# Patient Record
Sex: Female | Born: 1940 | Race: White | Hispanic: No | Marital: Married | Smoking: Never smoker
Health system: Southern US, Community
[De-identification: ages and names within clinical notes are randomized; demographics above are authoritative.]

## PROBLEM LIST (undated history)

## (undated) DIAGNOSIS — F419 Anxiety disorder, unspecified: Secondary | ICD-10-CM

## (undated) DIAGNOSIS — I10 Essential (primary) hypertension: Secondary | ICD-10-CM

## (undated) DIAGNOSIS — E079 Disorder of thyroid, unspecified: Secondary | ICD-10-CM

## (undated) DIAGNOSIS — E7800 Pure hypercholesterolemia, unspecified: Secondary | ICD-10-CM

## (undated) DIAGNOSIS — F329 Major depressive disorder, single episode, unspecified: Secondary | ICD-10-CM

## (undated) DIAGNOSIS — R52 Pain, unspecified: Secondary | ICD-10-CM

## (undated) DIAGNOSIS — F32A Depression, unspecified: Secondary | ICD-10-CM

## (undated) DIAGNOSIS — N390 Urinary tract infection, site not specified: Secondary | ICD-10-CM

## (undated) DIAGNOSIS — E039 Hypothyroidism, unspecified: Secondary | ICD-10-CM

## (undated) HISTORY — PX: TOTAL HIP ARTHROPLASTY: SHX124

## (undated) HISTORY — PX: OTHER SURGICAL HISTORY: SHX169

## (undated) HISTORY — PX: TONSILLECTOMY: SUR1361

## (undated) HISTORY — PX: HIP SURGERY: SHX245

---

## 1968-06-17 HISTORY — PX: BREAST BIOPSY: SHX20

## 1978-06-17 HISTORY — PX: AUGMENTATION MAMMAPLASTY: SUR837

## 1999-08-07 ENCOUNTER — Other Ambulatory Visit: Admission: RE | Admit: 1999-08-07 | Discharge: 1999-08-07 | Payer: Self-pay | Admitting: *Deleted

## 1999-08-24 ENCOUNTER — Encounter (INDEPENDENT_AMBULATORY_CARE_PROVIDER_SITE_OTHER): Payer: Self-pay | Admitting: *Deleted

## 1999-08-24 ENCOUNTER — Ambulatory Visit (HOSPITAL_COMMUNITY): Admission: RE | Admit: 1999-08-24 | Discharge: 1999-08-25 | Payer: Self-pay | Admitting: *Deleted

## 1999-08-24 ENCOUNTER — Encounter: Payer: Self-pay | Admitting: *Deleted

## 1999-12-03 ENCOUNTER — Ambulatory Visit (HOSPITAL_COMMUNITY): Admission: RE | Admit: 1999-12-03 | Discharge: 1999-12-03 | Payer: Self-pay | Admitting: *Deleted

## 2001-06-30 ENCOUNTER — Other Ambulatory Visit: Admission: RE | Admit: 2001-06-30 | Discharge: 2001-06-30 | Payer: Self-pay | Admitting: *Deleted

## 2003-02-15 ENCOUNTER — Ambulatory Visit (HOSPITAL_COMMUNITY): Admission: RE | Admit: 2003-02-15 | Discharge: 2003-02-15 | Payer: Self-pay | Admitting: Internal Medicine

## 2005-07-22 ENCOUNTER — Inpatient Hospital Stay (HOSPITAL_COMMUNITY): Admission: RE | Admit: 2005-07-22 | Discharge: 2005-07-26 | Payer: Self-pay | Admitting: Orthopedic Surgery

## 2005-08-28 ENCOUNTER — Encounter: Payer: Self-pay | Admitting: Orthopedic Surgery

## 2005-09-15 ENCOUNTER — Encounter: Payer: Self-pay | Admitting: Orthopedic Surgery

## 2005-09-18 ENCOUNTER — Other Ambulatory Visit: Admission: RE | Admit: 2005-09-18 | Discharge: 2005-09-18 | Payer: Self-pay | Admitting: Obstetrics and Gynecology

## 2005-10-15 ENCOUNTER — Encounter: Payer: Self-pay | Admitting: Orthopedic Surgery

## 2009-08-09 ENCOUNTER — Inpatient Hospital Stay (HOSPITAL_COMMUNITY): Admission: RE | Admit: 2009-08-09 | Discharge: 2009-08-12 | Payer: Self-pay | Admitting: Orthopedic Surgery

## 2009-09-12 ENCOUNTER — Encounter: Payer: Self-pay | Admitting: Orthopedic Surgery

## 2009-09-15 ENCOUNTER — Encounter: Payer: Self-pay | Admitting: Orthopedic Surgery

## 2009-10-15 ENCOUNTER — Encounter: Payer: Self-pay | Admitting: Orthopedic Surgery

## 2010-09-05 LAB — PROTIME-INR
INR: 1.02 (ref 0.00–1.49)
INR: 1.13 (ref 0.00–1.49)
INR: 2.09 — ABNORMAL HIGH (ref 0.00–1.49)
Prothrombin Time: 13.3 seconds (ref 11.6–15.2)
Prothrombin Time: 14.4 seconds (ref 11.6–15.2)
Prothrombin Time: 23.3 seconds — ABNORMAL HIGH (ref 11.6–15.2)

## 2010-09-05 LAB — COMPREHENSIVE METABOLIC PANEL
ALT: 18 U/L (ref 0–35)
AST: 25 U/L (ref 0–37)
Chloride: 102 mEq/L (ref 96–112)
GFR calc Af Amer: >60 mL/min (ref >60)
GFR calc non Af Amer: >60 mL/min (ref >60)
Sodium: 139 mEq/L (ref 135–145)
Total Bilirubin: 0.7 mg/dL (ref 0.3–1.2)

## 2010-09-05 LAB — URINALYSIS, ROUTINE W REFLEX MICROSCOPIC
Bilirubin Urine: NEGATIVE
Glucose, UA: NEGATIVE mg/dL
Ketones, ur: NEGATIVE mg/dL
Protein, ur: NEGATIVE mg/dL
Specific Gravity, Urine: 1.014 (ref 1.005–1.030)
Urobilinogen, UA: 0.2 mg/dL (ref 0.0–1.0)

## 2010-09-05 LAB — CBC
HCT: 27.8 % — ABNORMAL LOW (ref 36.0–46.0)
HCT: 41.3 % (ref 36.0–46.0)
Hemoglobin: 14.5 g/dL (ref 12.0–15.0)
Hemoglobin: 9.4 g/dL — ABNORMAL LOW (ref 12.0–15.0)
Hemoglobin: 9.6 g/dL — ABNORMAL LOW (ref 12.0–15.0)
Hemoglobin: 9.9 g/dL — ABNORMAL LOW (ref 12.0–15.0)
MCHC: 34.8 g/dL (ref 30.0–36.0)
MCHC: 35.2 g/dL (ref 30.0–36.0)
MCV: 95.4 fL (ref 78.0–100.0)
Platelets: 224 10*3/uL (ref 150–400)
Platelets: 231 10*3/uL (ref 150–400)
Platelets: 309 10*3/uL (ref 150–400)
RBC: 4.36 MIL/uL (ref 3.87–5.11)
RDW: 12.5 % (ref 11.5–15.5)
RDW: 12.6 % (ref 11.5–15.5)
WBC: 5.5 10*3/uL (ref 4.0–10.5)

## 2010-09-05 LAB — BASIC METABOLIC PANEL
BUN: 6 mg/dL (ref 6–23)
CO2: 28 mEq/L (ref 19–32)
CO2: 29 mEq/L (ref 19–32)
Calcium: 8.1 mg/dL — ABNORMAL LOW (ref 8.4–10.5)
Chloride: 103 mEq/L (ref 96–112)
GFR calc Af Amer: >60 mL/min (ref >60)
Glucose, Bld: 142 mg/dL — ABNORMAL HIGH (ref 70–99)
Potassium: 3.7 mEq/L (ref 3.5–5.1)
Sodium: 136 mEq/L (ref 135–145)
Sodium: 137 mEq/L (ref 135–145)

## 2010-11-02 NOTE — Discharge Summary (Signed)
[place]. [facility]  Patient:    Elizabeth Shepard, Elizabeth Shepard                           MRN: [id number] Adm. Date:  [id number] Disc. Date: [id number] Attending:  Carmelia Roller                           Discharge Summary  ADMITTING DIAGNOSIS:  Cholesteatoma, AD.  FINAL DIAGNOSIS:  Cholesteatoma, AD.  PROCEDURE PERFORMED:  Tympanomastoidectomy with ossiculoplasty, August 24, 1999.  HISTORY OF PRESENT ILLNESS:  Elizabeth Shepard is a 70 year old patient referred to our office by Dr. Charolotte Eke.  Elizabeth Shepard had a history of otalgia and decreased hearing in the right ear.  Initial examination showed apparent cholesteatoma above the short process of the malleus in the right ear.  Elizabeth Shepard was treated conservatively with drops.  She had a persistent middle ear effusion.  A polyethylene tube was inserted to improve visualization in the middle ear pathology.  A CT scan was obtained showing opacification of the middle ear, surrounding the ossicles, and opacification of the mastoid.  There was a suggestion of thinning or absence of the tegmen mastoideum.  Elizabeth Shepard findings were felt to be consistent with cholesteatoma and chronic mastoiditis.  She was a candidate or tympanomastoidectomy.  Indications and complications were discussed in detail.   ADMITTING PHYSICAL EXAMINATION:  Unremarkable with the exception of the otic findings.  HOSPITAL COURSE:  Elizabeth Shepard was admitted to [facility] on August 24, 1999. She was taken to the operating room the same day and underwent tympanomastoidectomy.  Intraoperative findings included cholesteatoma with a large amount of granulation tissue filling the posterior superior middle ear.  The epitympanum, antrum, and the superior portion of the mastoid disease appeared to be completely removed.  Removal required removal of an eroded incus and head of the malleus.  Reconstruction was obtained by incus rotation.  Elizabeth Shepard  tolerated the procedure well.  She was doing well her first postoperative day and is discharged.  DISCHARGE MEDICATIONS:  Keflex, Cortisporin, and Tylenol with Codeine.  FOLLOW-UP:  Elizabeth Shepard will be reexamined in our office in one week. DD:  08/25/99 TD:  08/26/99 Job: [id number]

## 2010-11-02 NOTE — Op Note (Signed)
. [facility]  Patient:    Elizabeth, Shepard                           MRN: [id number] Proc. Date: 08/24/99 Adm. Date:  [id number] Disc. Date: [id number] Attending:  Carmelia Roller CC:         Alfonse Flavors, M.D.             Louie L. Patseavouras, M.D.                           Operative Report  INDICATIONS AND JUSTIFICATION FOR PROCEDURE:  Elizabeth Shepard is a 70 year old patient, who was referred to our office by Dr. Magnus Ivan L. Patseavouras.  Elizabeth Shepard had a history of a plug sensation in her right ear.  She believed that this had its onset during a respiratory tract infection of October.  She had developed otalgia greater on the right than the left.  She felt as if she was talking in a barrel. Initial physical examination showed the left tympanic membrane was intact.  There were areas of tympanosclerosis and atrophy.  Right auditory canal contained dried discharge near the pars flaccida.  This was removed.  There was moist discharge  over the pars flaccida.  There was epitympanic membrane retraction with cholesteatomatous material present.  Elizabeth Shepard was initially started on Cortisporin.  She returned for debridement of the area.  Further examination showed granulation tissue over the pars flaccida.  A CT scan was obtained.  This showed extensive soft tissue opacification of the right middle ear and epitympanum with incasement and erosion of the ossicular chain, as well as, disruption of the tegmen tympani.  The findings were felt to be compatible with cholesteatoma.  Elizabeth Shepard had a middle ear effusion - AD.  She had a polyethylene tube inserted in the office for improved visualization.  Tympanomastoidectomy, its indications and complications were discussed in detail with Mrs. Buch and her husband.  She was  felt to be a candidate for tympanomastoidectomy because of probable cholesteatoma.  PREOPERATIVE DIAGNOSES: 1. Cholesteatoma, right ear  . 2. Chronic mastoiditis, right ear.  POSTOPERATIVE DIAGNOSES: 1. Cholesteatoma, right. 2. Chronic mastoiditis, right ear.  PROCEDURE PERFORMED:  Tympanomastoidectomy with incus rotation - A.D.  ANESTHESIA:  General endotracheal.  SURGEON:  Alfonse Flavors, M.D.  DESCRIPTION OF PROCEDURE:  Elizabeth Shepard was brought to the operating room, placed supine on the operating table.  She was induced with general anesthesia and intubated with an orotracheal tube.  The anesthetist noticed a small chip missing from a central incisor.  No chip could be found within the oral cavity. Xylocaine 1% with 100,000 epinephrine was injected in four quadrants in the right external auditory canal and the same solution was injected in the postauricular sulcus and over the temporalis fascia.  Needle electrodes for the Nim facial nerve monitor  were inserted in the orbicularis oris and the orbicularis oculi.  The right ear was prepped and draped in a sterile fashion.  Examination of the tympanic membrane ith the operating microscope showed a small mass of granulation tissue over the pars flaccida.  A posterior tympanotomy flap was incised from 6 oclock inferiorly to 3 oclock anteriorly.  A posterior tympanotomy flap was elevated to the fibrous annulus.  Fibrous annulus was elevated out of its sulcus.  The inferior middle ar was clear.  There was a mass of  granulation tissue covering the incus, stapes and posterior/superior quadrant of middle ear.  There was a polyethylene tube in place in the anterior/inferior quadrant.  The chordae tympani could not initially be visualized.  Using a Tabb knife, microcup forceps and suction, the granulation tissue was gradually dissected until the chorda tympani could be identified. The long process of the incus was identified and followed to the capitellum of the stapes.  Removal of the granulation tissue was extremely difficult as it was fibrous and adherent to all  surrounding structures.  Granulation tissue was gradually removed until the upper portion of the stapes superstructure could be  visualized.  The incus appeared to be hypermobile.  Cholesteatomatous debris was encountered along the long process of the incus and toward the head of the incus. Cotton balls with adrenalin were placed over the area of dissection.  A postauricular incision was made through the mastoid skin.  This was carried to he level of the temporalis fascia superiorly and to the mastoid cortex inferiorly. A T-shaped incision was made along the linea temporalis.  Temporalis adventitia and fascia were obtained for future use as a tympanic membrane graft.  The mastoid as exposed with a Office manager.  The area perforata and spine of Henley were exposed.  A Koerners flap was elevated from the posterior canal wall.  The oracle in Koerners flap were secured forward with a Belluci retractor, ______ retractor used to elevate temporalis muscle.  A large cutting bur was used to enter the mastoid cortex.  The mastoid cortex was progressively saucerized, identifying the area of the sigmoid sinus, tegmen mastoideum and posterior canal wall. Dissection was carried toward the antrum.  Inspite of the CT findings of marked thinning of the tegmen mastoideum, no dura was noted.  The area of the antrum was encountered. A large cholesteatoma was present.  Bone was carefully removed with Shepard bur to enlarge the antrum to allow access to the cholesteatoma.  The cholesteatoma matrix was debulked for better visualization.  Cholesteatoma was surrounded by dense fibrous granulation tissue.  This material was gradually dissected free from the antrum and posterior epitympanum.  The head of the incus was eroded. Incudostapedial joint was divided, the incus was rotated out of place.  The head of the malleus was amputated with a malleus nipper.  The chorda tympani remained intact.   Cholesteatoma and granulation tissue were removed from the epitympanum. Last remaining granulation tissue was along the facial ridge.  Granulation tissue  was carefully removed.  There was an area of dehiscent nerve in the horizontal portion of the facial nerve.  Nerve did not appear to be injured.  All disease as removed. Stimulation of the facial nerve proximal to the area of dehiscence showed normal response at 0.5 mA.  The mastoid and middle ear were irrigated.  The previously removed incus remnant was obtained and reshaped with a Hotel manager. Small pieces of Gelfoam were placed in the middle ear to surround the stapes superstructure.  The incus was placed between the long process of the malleus and the capitulum of the stapes.  The graft was supported with small pieces of Gelfoam.  Temporalis fascia was used to underlay the area of the pars flaccida, which had been the origin of the cholesteatoma.  The posterior middle ear and epitympanum  were filled with Gelfoam.  The tympanic membrane was turned posteriorly. Posterior tympanotomy flap was placed along the posterior canal wall.  Lateral surface of the tympanic membrane was covered with  Gelfoam.  The postauricular incision was closed with interrupted 3-0 chromic and 4-0 nylon.  Koerners flap was returned to the posterior canal wall.  Auditory canal was filled with Gelfoam.  Glasscock dressing was applied. SUMMARY OF PATHOLOGY:  Mrs. Hazelett had large cholesteatoma and abundant granulation tissue filling the posterior middle ear, epitympanum, antrum and superior portion of the mastoid.  Erosion of the head of the incus had taken place.  Head of the  malleus was amputated and incus rotation was performed.  The facial nerve was exposed in its horizontal portion by bone erosion.  FOLLOW-UP CARE:  Mrs. Soria will be admitted for overnight observation, IV hydration and IV analgesia.  Her discharge in the morning is  anticipated.  Discharge medications will include Keflex, Cortisporin and Tylenol with codeine.  She will be reexamined in our office in one week. DD:  08/24/99 TD:  08/26/99 Job: [id number]

## 2010-11-02 NOTE — H&P (Signed)
Elizabeth Shepard, Elizabeth Shepard                  ACCOUNT NO.:  [id number]   MEDICAL RECORD NO.:  [id number]          PATIENT TYPE:  INP   LOCATION:  NA                           FACILITY:  [facility]   PHYSICIAN:  Ollen Gross, M.D.    DATE OF BIRTH:  [birth year]   DATE OF ADMISSION:  DATE OF DISCHARGE:                                HISTORY & PHYSICAL   DATE OF OFFICE VISIT HISTORY AND PHYSICAL:  July 11, 2005.   CHIEF COMPLAINT:  Right hip pain.   HISTORY AND PHYSICAL:  Ms. Wulff is a 70 year old female who has been seen by  Dr. Lequita Halt for severe right hip pain.  She was referred over by Kerrin Champagne, M.D. having been found to have end-stage arthritis, that has  progressively gotten worse throughout the end of 2006.  It is felt that it  is interfering with her mobility.  She has gotten some temporary relief with  an intra-articular injection.  Her x-rays show that she does have some bone-  on-bone compartment loss with erosion of the superior aspect of the  acetabulum.  It is felt that due to the significant pain and arthritis in  her right hip, she would benefit from a hip replacement.  Risks and benefits  were discussed.  The patient is subsequently admitted to the hospital.   ALLERGIES:  CIPRO.   CURRENT MEDICATIONS:  1.  Darvocet.  2.  Ultram.  3.  Zoloft.  4.  Advil.  5.  Premarin.   PAST MEDICAL HISTORY:  History of UTI, osteoarthritis, postmenopausal.   PAST SURGICAL HISTORY:  Noncontributory.   FAMILY HISTORY:  Significant for diabetes and cancer.   SOCIAL HISTORY:  Married.  Part-time with [employer].  Nonsmoker,  no alcohol, one child.  She does have assistance lined up to help her with  care after surgery.   REVIEW OF SYSTEMS:  GENERAL:  No fevers, chills, or night sweats.  NEUROLOGY:  No seizures, syncope, or paralysis.  RESPIRATORY:  No shortness  of breath, productive cough, or hemoptysis.  CARDIOVASCULAR:  No angina,  chest pain, or orthopnea.   GASTROINTESTINAL:  No nausea, vomiting, diarrhea,  or constipation.  GENITOURINARY:  History of urinary tract infection.  No  hematuria or nocturia at this time.  MUSCULOSKELETAL:  Right hip.   PHYSICAL EXAMINATION:  VITAL SIGNS:  Pulse 68, respirations 12, blood  pressure 142/80.  GENERAL:  A 70 year old female, well-nourished, well-developed, in no acute  distress.  She is alert, oriented, cooperative, and pleasant.  She is  accompanied by her husband.  HEENT:  Normocephalic and atraumatic.  Pupils equal, round, and reactive to  light.  Oropharynx clear.  EOM's intact.  NECK:  Supple.  CHEST:  Clear.  HEART:  Regular rate and rhythm.  No murmur.  S1 and S2 noted.  ABDOMEN:  Soft, nontender.  Bowel sounds present.  RECTAL:  BREASTS:  GENITOURINARY:  Not done, not pertinent to present  illness.  EXTREMITIES:  Right hip shows flexion of only 95 degrees, no internal  rotation, about 10-15 degrees of  abduction and 10 degrees of adduction.  She  does ambulate with an antalgic gait.   IMPRESSION:  1.  Osteoarthritis right hip.  2.  History of urinary tract infections.  3.  Postmenopausal.   PLAN:  The patient was admitted to [facility] to undergo  right total hip arthroplasty.  Surgery will be performed by Ollen Gross,  M.D.      Alexzandrew L. Julien Girt, P.A.      Ollen Gross, M.D.  Electronically Signed    ALP/MEDQ  D:  07/21/2005  T:  07/22/2005  Job:  [id number]

## 2010-11-02 NOTE — Discharge Summary (Signed)
Elizabeth Shepard, Elizabeth Shepard                  ACCOUNT NO.:  [id number]   MEDICAL RECORD NO.:  [id number]          PATIENT TYPE:  INP   LOCATION:  1514                         FACILITY:  [facility]   PHYSICIAN:  Ollen Gross, M.D.    DATE OF BIRTH:  08-30-1940   DATE OF ADMISSION:  07/22/2005  DATE OF DISCHARGE:  07/26/2005                                 DISCHARGE SUMMARY   ADMISSION DIAGNOSES:  1.  Osteoarthritis, right hip.  2.  History of urinary tract infection.  3.  Postmenopausal.   DISCHARGE DIAGNOSES:  1.  Osteoarthritis, right hip status post right total hip arthroplasty.  2.  Mild postoperative blood loss anemia that did not require transfusion.  3.  History of urinary tract infection.  4.  Postmenopausal.   PROCEDURE:  On July 22, 2005 right total hip, surgeon Dr. Lequita Halt,  assistant Avel Peace, PA-C, anesthesia general, blood loss 500 mL.   CONSULTATIONS:  None.   BRIEF HISTORY:  Elizabeth Shepard is a 70 year old female with end-stage arthritis of  the right hip with intractable pain who now presents for total hip  arthroplasty.   LABORATORY DATA:  Preop CBC hemoglobin 13.2, hematocrit 38.2, normal white  count. Postop hemoglobin down to 10, drifted down to 9.7, lasted noted H&H  was stabilized at 9.7 and 27.5. PT and PTT preop 12.9 and 31 respectively,  INR 1.0. Serial pro times followed. Last noted PT and INR 19.4 and 1.6. Chem  panel on admission all within normal limits. Serial BMETs are followed and  sodium dropped from 136 to 134, glucose went up from 110 to 131 back down to  127. Remaining electrolytes remained within normal limits. Preop UA  negative. Blood group type O+.   EKG dated July 11, 2005, normal sinus rhythm, normal EKG, no significant  change since last tracing of August 23, 1999 confirmed by Dr. Olga Millers.  Right hip films on July 11, 2005, advanced osteoarthritis of right hip.  Portable pelvis July 22, 2005 and portable right hip July 22, 2005  both show right total hip without complications.   HOSPITAL COURSE:  Admitted to [facility], tolerated procedure  well, later transferred to the recovery room and then the orthopedic floor  and started on PCA and p.o. analgesics for pain control following surgery.  Doing quite well on day 1 with the exception of some mild asymptomatic  hypotension, given fluid boluses and I&Os were watched very carefully. The  Foley was left in, responded well to the fluids and the pressure was back up  by the following day. Started getting up with physical therapy on day 1 to a  chair and then by day 2 started getting up ambulating actually 80 feet and  then 120 feet later that day. Dressing was changed on day 2, incision looked  good. By day 3 again the asymptomatic hypotension postop had resolved,  tolerating fluids well by mouth, discontinued PCA and IVs. Already had a  small bowel movement on day 3, doing well with therapy and was ready to go  home by the  following day on day 4.   PLAN:  1.  The patient discharged home on July 26, 2005.  2.  Discharge diagnoses please see above.  3.  Discharge meds:  Coumadin, Percocet, Robaxin.  4.  Diet as tolerated.  5.  Followup in 2 weeks.   ACTIVITY:  Partial weight bearing 25-50%, home health PT and home health  nursing, total hip protocol.   DISPOSITION:  Home.   CONDITION ON DISCHARGE:  Improved.      Alexzandrew L. Julien Girt, P.A.      Ollen Gross, M.D.  Electronically Signed    ALP/MEDQ  D:  09/02/2005  T:  09/03/2005  Job:  [id number]

## 2010-11-02 NOTE — Op Note (Signed)
NAMEGERALENE, AFSHAR                  ACCOUNT NO.:  [id number]   MEDICAL RECORD NO.:  [id number]          PATIENT TYPE:  INP   LOCATION:  0008                         FACILITY:  [facility]   PHYSICIAN:  Ollen Gross, M.D.    DATE OF BIRTH:  07-18-1940   DATE OF PROCEDURE:  07/22/2005  DATE OF DISCHARGE:                                 OPERATIVE REPORT   PREOPERATIVE DIAGNOSIS:  Osteoarthritis right hip.   POSTOPERATIVE DIAGNOSIS:  Osteoarthritis right hip.   PROCEDURE:  Right total hip arthroplasty.   SURGEON:  Ollen Gross, M.D.   ASSISTANT:  Alexzandrew L. Julien Girt, P.A.   ANESTHESIA:  General.   ESTIMATED BLOOD LOSS:  500.   DRAINS:  Hemovac x1.   COMPLICATIONS:  None.   CONDITION:  Stable to recovery.   BRIEF CLINICAL NOTE:  Ms. Pinheiro is a 70 year old female with end-stage  osteoarthritis of the right hip. She has had intractable pain. She presents  now for right total hip arthroplasty.   PROCEDURE IN DETAIL:  After successful administration of general anesthetic,  the patient's placed in the left lateral decubitus position with the right  side up and held with the hip positioner. The right lower extremity is  isolated from her perineum with plastic drapes and prepped and draped in the  usual sterile fashion. The short posterolateral incision is then made with a  10 blade through the subcutaneous tissue to the level of the fascia lata  which is incised in line with the skin incision. The sciatic nerve is  palpated and protected and the short rotators isolated off the femur.  Capsulectomy is performed and the hip is dislocated. She has a very long  femoral neck with a Veress neck shaft angle. The center of the femoral head  is marked and the trial prosthesis placed such that the center of the trial  head corresponds to the center of the native femoral head. Osteotomy line is  marked on the femoral neck and osteotomy made with an oscillating saw. The  femoral head is removed  and then the femur retracted anteriorly to gain  acetabular exposure.   The acetabular labrum and osteophytes were removed. We started reaming at 43  mm coursing in increments of 2 up to 51 mm. A 52 mm pinnacle acetabular  shell was placed in anatomic position and transfixed with two dome screws  with excellent purchase. The trial 36 mm neutral liner was placed.   On the femoral side, we prepared the proximal femur with the canal finder  and irrigation. Axial reaming is performed up to 13.5 mm proximal, reaming  to an 18D and the sleeve machined to a small. An 18D small trial sleeve is  placed with an 18 x 13 stem and a 36 plus 8 neck. We trialed a 36 plus zero  head. With the 36 plus 8, there was not enough offset. We went to a 36 plus  12 and ended up going up to 36 plus 6 head. With that combination, she had  great stability with full extension, full external rotation, 70  degrees  flexion, 40 degrees adduction, 90 degrees internal rotation and 90 degrees  of flexion, and 70 degrees of internal rotation. By placing the right leg on  top of the left, it was felt as though the leg lengths were equal. The  trials were then removed and the permanent apex hole eliminator placed into  the acetabular shell. The permanent 36 mm neutral Ultamet metal liner was  placed. This is a metal-on-metal hip replacement. On the femoral side, the  18D small sleeve is placed with an 18 x 13 stem and a 36 plus 12 neck  matching her native anteversion. A 36 plus 6 head is placed and the hip is  reduced with the same stability parameters. The wound was copiously  irrigated with saline solution and the short rotators reattached to the  femur through drill holes. The fascia lata was closed over a Hemovac drain  and then the subcu closed with interrupted #1 Vicryl and 2-0 Vicryl.  Subcuticular was closed with a running 4-0 Monocryl. The drain is hooked to  suction, incision cleaned and dried and Steri-Strips and a  bulky sterile  dressing applied. She is then placed into a knee immobilizer, awakened and  transported to recovery in stable condition.      Ollen Gross, M.D.  Electronically Signed     FA/MEDQ  D:  07/22/2005  T:  07/22/2005  Job:  [id number]

## 2010-11-02 NOTE — H&P (Signed)
[place]. [facility]  Patient:    Elizabeth Shepard, Elizabeth Shepard                           MRN: [id number] Adm. Date:  [id number] Attending:  Carmelia Roller                         History and Physical  CHIEF COMPLAINT:  Otalgia and hearing loss.  HISTORY OF PRESENT ILLNESS:  Elizabeth Shepard is a 70 year old patient referred to our office by Dr. De Nurse.  Elizabeth Shepard had a history of otalgia and decreased hearing in the right ear.  Initial examination showed apparent cholesteatoma above the short process of the malleolus in the right ear.  Elizabeth Shepard was treated conservatively with drops.  She had a persistent middle ear effusion.  A polyethylene tube was inserted in the office to improve visualization of the middle ear pathology.  A CT scan was obtained showing opacification of the middle ear epitympanum and mastoid, suggesting cholesteatoma.  Elizabeth Shepard was felt to be a candidate for tympanomastoidectomy because of these findings.  The indications nd complications of the procedure were discussed in detail with her.  PAST MEDICAL HISTORY/PRIOR SURGERY:  Tonsillectomy, exploratory laparotomy, cosmetic surgery.  CURRENT MEDICATIONS:  Zoloft.  ALLERGIES:  CIPRO.  REVIEW OF SYSTEMS:  Elizabeth Shepard denies having any cardiac or pulmonary disease. She does not have any other active systemic disease other than requiring the use of  Zoloft.  PHYSICAL EXAMINATION:  GENERAL:  Elizabeth Shepard is a pleasant adult.  HEENT:  The external auditory canals are normal bilaterally.  Left tympanic membrane has areas of sclerosis and atrophy but is intact.  The right tympanic membrane has massive granulation tissue over the pars flaccida.  A ventilating ube was in place.  The anterior nose has no purulence or polyps.  The mouth and her  oropharynx are normal.  CHEST:  Clear.  HEART:  Heart sounds are normal.  ABDOMEN:  Soft and nontender.  EXTREMITIES:  Normal.  IMPRESSION:   Probable cholesteatoma AD.  RECOMMENDATIONS:  Elizabeth Shepard with have a tympanomastoidectomy AD. DD:  08/24/99 TD:  08/24/99 Job: [id number]

## 2010-11-02 NOTE — Op Note (Signed)
[facility]  Patient:    Elizabeth Shepard, Elizabeth Shepard                           MRN: [id number] Proc. Date: 12/03/99 Adm. Date:  [id number] Disc. Date: [id number] Attending:  Carmelia Roller                           Operative Report  PREOPERATIVE DIAGNOSIS:  Progressive epitympanic membrane retraction pocket.  POSTOPERATIVE DIAGNOSIS:  Progressive epitympanic membrane retraction pocket.  PROCEDURE:  Cartilage tympanoplasty, right ear.  SURGEON:  Alfonse Flavors, M.D.  ASSISTANT:  ANESTHESIA:  General endotracheal.  INDICATIONS:  Abiageal Blowe is a 70 year old patient who was referred to our office by Louie L. Patseavouras, M.D.  Ms. Rheaume had a history of a plugged sensation in her right ear.  She believed that this had had its onset during a respiratory tract infection.  The initial physical examination showed discharge in the right auditory canal.  The discharge was dried and over the area of the pars flaccida.  When the discharge was removed, there was moist material over the pars flaccida and an epitympanic membrane retraction pocket. Cholesteatomatous material was present.  A CT scan was obtained that showed extensive soft tissue opacification of right middle ear and epitympanum.  A polyethylene tube was inserted in the office to improve visualization of the middle ear structures.  A tympanomastoidectomy was performed on August 24, 1999. Intraoperative findings included large cholesteatoma and abundant granulation tissue filling the posterior middle ear, epitympanum, antrum, and superior portion of the mastoid AD.  The head of the incus had been eroded.  The head of the malleolus was rotated and an incus rotation was performed.  Ms. Haser did well initially postoperatively.  She then began to develop a progressive epitympanic retraction pocket in the same ear in spite of the fact that a patent ventilating tube was present.  Because of the progression of  the retraction pocket, it was thought that it would form another cholesteatoma.  A cartilage tympanoplasty with the insertion of tragal cartilage beneath the epitympanic membrane was suggested.  The indications and complications were discussed in detail with Ms. Babineaux and her husband.  DESCRIPTION OF PROCEDURE:  Ms. Zody was brought to the operating room and placed supine on the operating table.  She was induced with general anesthesia and intubated with an orotracheal tube.  One percent Xylocaine with 1:100,000 epinephrine was injected into the right tragus.  The same solution was injected into the four quadrants of the right external auditory canal. The right ear and face were prepped and draped in a sterile fashion. Examination of the tympanic membrane showed deep epitympanic membrane retraction.  An incision was made from 3 oclock anteriorly to 7 oclock inferiorly.  The canal skin was elevated up to the fibrous annulus.  The fibrous annulus was elevated out of its sulcus.  The tympanic membrane was thickened.  It was freed from the chorda tympani.  Dissecting with McCabe knife and Tabb knife, the epitympanic membrane retraction pocket was dissected out of the epitympanum intact.  The retraction pocket was not torn and no squamous material was left within the attic.  A small membrane which separated the anterior epitympanum from the posterior epitympanum was disrupted.  An incision was made in the right tragus.  The tragal cartilage was identified and dissected free from the overlying skin.  A portion of the tragal cartilage was harvested for use as a graft.  Hemostasis was obtained with electrocautery.  The incision was closed with interrupted 4-0 chromic and 6-0 nylon.  The perichondrium was elevated from one side of the cartilage graft, leaving it attached to the other side.  The cartilage was trimmed to fit the epitympanic defect.  Gelfoam was placed into the epitympanum.   The cartilage graft was placed into the bony defect in the scutum.  The perichondrium extended laterally down the canal wall to secure the cartilage in place.  The tympanic membrane and tympanotomy flap were turned back into position.  The lateral tympanic membrane and tympanotomy flap were covered with Gelfoam.  The medial canal was filled with Gelfoam.  Ms. Kvamme tolerated the procedure well and was taken to the recovery room in satisfactory condition.  SUMMARY OF PATHOLOGY:  Ms. Bridwell had a deep epitympanic membrane retraction pocket.  This was everted intact.  The incus rotation and stapes were not disturbed.  No cholesteatomatous material was seen within the middle ear.  DISPOSITION:  Ms. Fehring will be discharged as an outpatient.  DISCHARGE MEDICATIONS:  Tylenol with codeine, Keflex, and Cortisporin drops.  FOLLOW-UP:  Ms. Guerin will be seen in our office for re-examination. DD:  12/03/99 TD:  12/05/99 Job: [id number]

## 2012-09-16 ENCOUNTER — Ambulatory Visit: Payer: Self-pay | Admitting: Family Medicine

## 2013-10-14 ENCOUNTER — Ambulatory Visit: Payer: Self-pay | Admitting: Family Medicine

## 2014-09-30 ENCOUNTER — Other Ambulatory Visit: Payer: Self-pay | Admitting: Family Medicine

## 2014-09-30 DIAGNOSIS — Z139 Encounter for screening, unspecified: Secondary | ICD-10-CM

## 2014-10-17 ENCOUNTER — Ambulatory Visit
Admission: RE | Admit: 2014-10-17 | Discharge: 2014-10-17 | Disposition: A | Discharge status: Home / Self Care | Payer: Medicare Other | Source: Ambulatory Visit | Attending: Family Medicine | Admitting: Family Medicine

## 2014-10-17 DIAGNOSIS — Z9882 Breast implant status: Secondary | ICD-10-CM | POA: Insufficient documentation

## 2014-10-17 DIAGNOSIS — Z1231 Encounter for screening mammogram for malignant neoplasm of breast: Secondary | ICD-10-CM | POA: Diagnosis present

## 2014-10-17 DIAGNOSIS — Z139 Encounter for screening, unspecified: Secondary | ICD-10-CM

## 2014-11-07 DIAGNOSIS — N3001 Acute cystitis with hematuria: Secondary | ICD-10-CM | POA: Insufficient documentation

## 2014-12-01 ENCOUNTER — Ambulatory Visit: Payer: Self-pay

## 2015-07-04 ENCOUNTER — Encounter: Payer: Self-pay | Admitting: Emergency Medicine

## 2015-07-04 ENCOUNTER — Emergency Department: Payer: Medicare Other

## 2015-07-04 ENCOUNTER — Inpatient Hospital Stay
Admission: EM | Admit: 2015-07-04 | Discharge: 2015-07-07 | DRG: 812 | Disposition: A | Discharge status: Home / Self Care | Payer: Medicare Other | Attending: Internal Medicine | Admitting: Internal Medicine

## 2015-07-04 DIAGNOSIS — F419 Anxiety disorder, unspecified: Secondary | ICD-10-CM | POA: Diagnosis present

## 2015-07-04 DIAGNOSIS — I10 Essential (primary) hypertension: Secondary | ICD-10-CM | POA: Diagnosis present

## 2015-07-04 DIAGNOSIS — D649 Anemia, unspecified: Secondary | ICD-10-CM | POA: Diagnosis present

## 2015-07-04 DIAGNOSIS — E039 Hypothyroidism, unspecified: Secondary | ICD-10-CM | POA: Diagnosis present

## 2015-07-04 DIAGNOSIS — T39315A Adverse effect of propionic acid derivatives, initial encounter: Secondary | ICD-10-CM | POA: Diagnosis present

## 2015-07-04 DIAGNOSIS — K319 Disease of stomach and duodenum, unspecified: Secondary | ICD-10-CM | POA: Diagnosis present

## 2015-07-04 DIAGNOSIS — R531 Weakness: Secondary | ICD-10-CM | POA: Diagnosis present

## 2015-07-04 DIAGNOSIS — K449 Diaphragmatic hernia without obstruction or gangrene: Secondary | ICD-10-CM | POA: Diagnosis present

## 2015-07-04 DIAGNOSIS — F329 Major depressive disorder, single episode, unspecified: Secondary | ICD-10-CM | POA: Diagnosis present

## 2015-07-04 DIAGNOSIS — Z6828 Body mass index (BMI) 28.0-28.9, adult: Secondary | ICD-10-CM | POA: Diagnosis not present

## 2015-07-04 DIAGNOSIS — Z87891 Personal history of nicotine dependence: Secondary | ICD-10-CM

## 2015-07-04 DIAGNOSIS — D509 Iron deficiency anemia, unspecified: Principal | ICD-10-CM | POA: Diagnosis present

## 2015-07-04 DIAGNOSIS — Z881 Allergy status to other antibiotic agents status: Secondary | ICD-10-CM

## 2015-07-04 DIAGNOSIS — K259 Gastric ulcer, unspecified as acute or chronic, without hemorrhage or perforation: Secondary | ICD-10-CM | POA: Diagnosis present

## 2015-07-04 DIAGNOSIS — E785 Hyperlipidemia, unspecified: Secondary | ICD-10-CM | POA: Diagnosis present

## 2015-07-04 DIAGNOSIS — Z96643 Presence of artificial hip joint, bilateral: Secondary | ICD-10-CM | POA: Diagnosis present

## 2015-07-04 DIAGNOSIS — Z79899 Other long term (current) drug therapy: Secondary | ICD-10-CM | POA: Diagnosis not present

## 2015-07-04 DIAGNOSIS — E7800 Pure hypercholesterolemia, unspecified: Secondary | ICD-10-CM | POA: Diagnosis present

## 2015-07-04 DIAGNOSIS — K2210 Ulcer of esophagus without bleeding: Secondary | ICD-10-CM | POA: Diagnosis present

## 2015-07-04 DIAGNOSIS — E860 Dehydration: Secondary | ICD-10-CM | POA: Diagnosis present

## 2015-07-04 HISTORY — DX: Pain, unspecified: R52

## 2015-07-04 HISTORY — DX: Major depressive disorder, single episode, unspecified: F32.9

## 2015-07-04 HISTORY — DX: Disorder of thyroid, unspecified: E07.9

## 2015-07-04 HISTORY — DX: Pure hypercholesterolemia, unspecified: E78.00

## 2015-07-04 HISTORY — DX: Hypothyroidism, unspecified: E03.9

## 2015-07-04 HISTORY — DX: Essential (primary) hypertension: I10

## 2015-07-04 HISTORY — DX: Depression, unspecified: F32.A

## 2015-07-04 LAB — BASIC METABOLIC PANEL
Anion gap: 9 (ref 5–15)
BUN: 23 mg/dL — ABNORMAL HIGH (ref 6–20)
CALCIUM: 8.6 mg/dL — AB (ref 8.9–10.3)
CO2: 21 mmol/L — AB (ref 22–32)
CREATININE: 1.07 mg/dL — AB (ref 0.44–1.00)
Chloride: 105 mmol/L (ref 101–111)
GFR calc non Af Amer: 51 mL/min — ABNORMAL LOW (ref >60)
GFR, EST AFRICAN AMERICAN: 59 mL/min — AB (ref >60)
Glucose, Bld: 117 mg/dL — ABNORMAL HIGH (ref 65–99)
Potassium: 4.7 mmol/L (ref 3.5–5.1)
SODIUM: 135 mmol/L (ref 135–145)

## 2015-07-04 LAB — CBC
HCT: 17.5 % — ABNORMAL LOW (ref 35.0–47.0)
Hemoglobin: 5.3 g/dL — ABNORMAL LOW (ref 12.0–16.0)
MCH: 21 pg — AB (ref 26.0–34.0)
MCHC: 30.2 g/dL — AB (ref 32.0–36.0)
MCV: 69.8 fL — ABNORMAL LOW (ref 80.0–100.0)
PLATELETS: 486 10*3/uL — AB (ref 150–440)
RBC: 2.51 MIL/uL — AB (ref 3.80–5.20)
RDW: 19.7 % — ABNORMAL HIGH (ref 11.5–14.5)
WBC: 6.9 10*3/uL (ref 3.6–11.0)

## 2015-07-04 LAB — URINALYSIS COMPLETE WITH MICROSCOPIC (ARMC ONLY)
BILIRUBIN URINE: NEGATIVE
Bacteria, UA: NONE SEEN
GLUCOSE, UA: NEGATIVE mg/dL
Hgb urine dipstick: NEGATIVE
Ketones, ur: NEGATIVE mg/dL
Leukocytes, UA: NEGATIVE
Nitrite: NEGATIVE
Protein, ur: NEGATIVE mg/dL
Specific Gravity, Urine: 1.01 (ref 1.005–1.030)
pH: 6 (ref 5.0–8.0)

## 2015-07-04 LAB — IRON AND TIBC
IRON: 9 ug/dL — AB (ref 28–170)
Saturation Ratios: 2 % — ABNORMAL LOW (ref 10.4–31.8)
TIBC: 471 ug/dL — AB (ref 250–450)
UIBC: 462 ug/dL

## 2015-07-04 LAB — FERRITIN: Ferritin: 5 ng/mL — ABNORMAL LOW (ref 11–307)

## 2015-07-04 LAB — ABO/RH: ABO/RH(D): O POS

## 2015-07-04 LAB — PROTIME-INR
INR: 1.11
PROTHROMBIN TIME: 14.5 s (ref 11.4–15.0)

## 2015-07-04 LAB — RETICULOCYTES
RBC.: 2.55 MIL/uL — AB (ref 3.80–5.20)
RETIC CT PCT: 2.9 % (ref 0.4–3.1)
Retic Count, Absolute: 74 10*3/uL (ref 19.0–183.0)

## 2015-07-04 LAB — TROPONIN I: Troponin I: <0.03 ng/mL (ref <0.031)

## 2015-07-04 LAB — PREPARE RBC (CROSSMATCH)

## 2015-07-04 MED ORDER — FUROSEMIDE 10 MG/ML IJ SOLN
40.0000 mg | Freq: Once | INTRAMUSCULAR | Status: AC
  Administered 2015-07-05: 02:00:00 40 mg via INTRAVENOUS
  Filled 2015-07-04: qty 4

## 2015-07-04 MED ORDER — ATORVASTATIN CALCIUM 20 MG PO TABS
40.0000 mg | ORAL_TABLET | Freq: Every day | ORAL | Status: DC
  Administered 2015-07-04 – 2015-07-06 (×3): 40 mg via ORAL
  Filled 2015-07-04 (×3): qty 2

## 2015-07-04 MED ORDER — SERTRALINE HCL 50 MG PO TABS
100.0000 mg | ORAL_TABLET | Freq: Every day | ORAL | Status: DC
Start: 2015-07-04 — End: 2015-07-07
  Administered 2015-07-04 – 2015-07-06 (×3): 100 mg via ORAL
  Filled 2015-07-04 (×3): qty 2

## 2015-07-04 MED ORDER — LISINOPRIL 10 MG PO TABS
10.0000 mg | ORAL_TABLET | Freq: Every day | ORAL | Status: DC
  Administered 2015-07-05 – 2015-07-07 (×3): 10 mg via ORAL
  Filled 2015-07-04 (×3): qty 1

## 2015-07-04 MED ORDER — ACETAMINOPHEN 325 MG PO TABS
650.0000 mg | ORAL_TABLET | Freq: Four times a day (QID) | ORAL | Status: DC | PRN
  Filled 2015-07-04: qty 2

## 2015-07-04 MED ORDER — ACETAMINOPHEN 325 MG PO TABS
650.0000 mg | ORAL_TABLET | Freq: Once | ORAL | Status: AC
  Administered 2015-07-04: 22:00:00 650 mg via ORAL

## 2015-07-04 MED ORDER — LEVOTHYROXINE SODIUM 25 MCG PO TABS
25.0000 ug | ORAL_TABLET | Freq: Every day | ORAL | Status: DC
  Administered 2015-07-05 – 2015-07-07 (×3): 25 ug via ORAL
  Filled 2015-07-04 (×4): qty 1

## 2015-07-04 MED ORDER — TRAMADOL HCL 50 MG PO TABS
50.0000 mg | ORAL_TABLET | Freq: Four times a day (QID) | ORAL | Status: DC | PRN
  Administered 2015-07-05 – 2015-07-07 (×4): 50 mg via ORAL
  Filled 2015-07-04 (×4): qty 1

## 2015-07-04 MED ORDER — LORATADINE 10 MG PO TABS
10.0000 mg | ORAL_TABLET | Freq: Every day | ORAL | Status: DC
  Administered 2015-07-07: 10:00:00 10 mg via ORAL
  Filled 2015-07-04 (×4): qty 1

## 2015-07-04 MED ORDER — SODIUM CHLORIDE 0.9 % IV SOLN: 10.0000 mL/h | Freq: Once | INTRAVENOUS | Status: DC

## 2015-07-04 MED ORDER — ACETAMINOPHEN 650 MG RE SUPP: 650.0000 mg | Freq: Four times a day (QID) | RECTAL | Status: DC | PRN

## 2015-07-04 MED ORDER — DIPHENHYDRAMINE HCL 25 MG PO CAPS
25.0000 mg | ORAL_CAPSULE | Freq: Once | ORAL | Status: AC
  Administered 2015-07-04: 25 mg via ORAL
  Filled 2015-07-04: qty 1

## 2015-07-04 MED ORDER — ESTROGENS, CONJUGATED 0.625 MG/GM VA CREA
1.0000 | TOPICAL_CREAM | VAGINAL | Status: DC
  Administered 2015-07-06: 1 via VAGINAL
  Filled 2015-07-04: qty 30

## 2015-07-04 NOTE — ED Notes (Signed)
Reports generalized weakness and sob for several months.  No slurred speech, grips equal, no facial droop, MAE.  Skin w/d and pale. A&o.  Lungs clear.

## 2015-07-04 NOTE — ED Notes (Signed)
IV placed in ac, pt to receive blood

## 2015-07-04 NOTE — H&P (Signed)
[facility] - [place] [facility]   PATIENT NAME: Elizabeth Shepard    MR#:  [id number]  DATE OF BIRTH:  1941-05-07  DATE OF ADMISSION:  07/04/2015  PRIMARY CARE PHYSICIAN: Leanna Sato, MD   REQUESTING/REFERRING PHYSICIAN: Dr. Janalyn Harder  CHIEF COMPLAINT:   Chief Complaint  Patient presents with  . Weakness    HISTORY OF PRESENT ILLNESS:  Elizabeth Shepard  is a 75 y.o. female with a known history of hypertension, hyperlipidemia, anxiety and depression, hypothyroidism. She has been having no energy and weak going on for months and months. She's been having dizziness and lightheadedness. She's having pain with breathing. As per the husband, she does very little. When she goes shopping she has to sit down and rest. She does have some lower abdominal pain. She has dry mouth. She's had some memory issues. In the ER, she was found to be guaiac negative with a hemoglobin of 5.3 and hospitalist services were contacted for further evaluation.  PAST MEDICAL HISTORY:   Past Medical History  Diagnosis Date  . Hypertension   . Hypercholesteremia   . Thyroid disease   . Generalized pain   . Depression   . Hypothyroidism     PAST SURGICAL HISTORY:   Past Surgical History  Procedure Laterality Date  . Augmentation mammaplasty    . Breast biopsy Right 1970  . Hip surgery    . Tonsillectomy    . Total hip arthroplasty Bilateral   . Mastoid surgery      SOCIAL HISTORY:   Social History  Substance Use Topics  . Smoking status: Former Games developer  . Smokeless tobacco: Not on file  . Alcohol Use: No    FAMILY HISTORY:   Family History  Problem Relation Age of Onset  . CAD Mother   . Diabetes Mother   . CAD Father   . Diabetes Father     DRUG ALLERGIES:   Allergies  Allergen Reactions  . Ciprofloxacin Itching and Rash    REVIEW OF SYSTEMS:  CONSTITUTIONAL: No fever. Positive for fatigue and no energy. Positive for weight gain EYES: No blurred or  double vision. Wears contacts EARS, NOSE, AND THROAT: No tinnitus or ear pain. No sore throat. Decreased hearing RESPIRATORY: No cough, positive for shortness of breath with walking, no wheezing or hemoptysis.  CARDIOVASCULAR: Occasional chest pain, some edema.  GASTROINTESTINAL: Some nausea, no vomiting, no diarrhea , some abdominal pain. No blood in bowel movements that she sees. GENITOURINARY: No dysuria, hematuria.  ENDOCRINE: No polyuria, nocturia,  HEMATOLOGY: History of anemia, no easy bruising or bleeding SKIN: No rash or lesion. MUSCULOSKELETAL: No joint pain or arthritis.   NEUROLOGIC: No tingling, numbness, weakness.  PSYCHIATRY: No anxiety or depression.   MEDICATIONS AT HOME:   Prior to Admission medications   Medication Sig Start Date End Date Taking? Authorizing Provider  atorvastatin (LIPITOR) 40 MG tablet Take 40 mg by mouth at bedtime.    Yes Historical Provider, MD  cetirizine (ZYRTEC) 10 MG tablet Take 10 mg by mouth daily as needed for allergies.   Yes Historical Provider, MD  conjugated estrogens (PREMARIN) vaginal cream Place 1 Applicatorful vaginally every 3 (three) days.   Yes Historical Provider, MD  levothyroxine (SYNTHROID, LEVOTHROID) 25 MCG tablet Take 25 mcg by mouth daily before breakfast.   Yes Historical Provider, MD  lisinopril (PRINIVIL,ZESTRIL) 10 MG tablet Take 10 mg by mouth daily.   Yes Historical Provider, MD  sertraline (ZOLOFT) 100 MG tablet Take  100 mg by mouth at bedtime.   Yes Historical Provider, MD  traMADol (ULTRAM) 50 MG tablet Take 50 mg by mouth every 6 (six) hours as needed for moderate pain.   Yes Historical Provider, MD      VITAL SIGNS:  Blood pressure 108/44, pulse 84, temperature 98.3 F (36.8 C), temperature source Oral, resp. rate 18, height  (1.626 m), weight 76.204 kg (168 lb), SpO2 98 %.  PHYSICAL EXAMINATION:  GENERAL:  75 y.o.-year-old patient lying in the bed with no acute distress.  EYES: Pupils equal, round,  reactive to light and accommodation. No scleral icterus. Extraocular muscles intact. Conjunctiva pale. HEENT: Head atraumatic, normocephalic. Oropharynx and nasopharynx clear.  NECK:  Supple, no jugular venous distention. No thyroid enlargement, no tenderness.  LUNGS: Normal breath sounds bilaterally, no wheezing, rales,rhonchi or crepitation. No use of accessory muscles of respiration.  CARDIOVASCULAR: S1, S2 normal. No murmurs, rubs, or gallops.  ABDOMEN: Soft, nontender, nondistended. Bowel sounds present. No organomegaly or mass.  EXTREMITIES: Trace edema, no cyanosis, or clubbing.  NEUROLOGIC: Cranial nerves II through XII are intact. Muscle strength 5/5 in all extremities. Sensation intact. Gait not checked.  PSYCHIATRIC: The patient is alert and oriented x 3.  SKIN: No rash, lesion, or ulcer.   LABORATORY PANEL:   CBC  Recent Labs Lab 07/04/15 1417  WBC 6.9  HGB 5.3*  HCT 17.5*  PLT 486*   ------------------------------------------------------------------------------------------------------------------  Chemistries   Recent Labs Lab 07/04/15 1417  NA 135  K 4.7  CL 105  CO2 21*  GLUCOSE 117*  BUN 23*  CREATININE 1.07*  CALCIUM 8.6*   ------------------------------------------------------------------------------------------------------------------  Cardiac Enzymes  Recent Labs Lab 07/04/15 1417  TROPONINI <0.03   ------------------------------------------------------------------------------------------------------------------  RADIOLOGY:  Dg Chest 2 View  07/04/2015  CLINICAL DATA:  Weakness and dizziness for 6 months. Shortness of breath, fatigue. EXAM: CHEST  2 VIEW COMPARISON:  None. FINDINGS: Heart and mediastinal contours are within normal limits. No focal opacities or effusions. No acute bony abnormality. Calcified bilateral breast implants noted. No acute bony abnormality. IMPRESSION: No active cardiopulmonary disease. Electronically Signed   By: Charlett Nose M.D.   On: 07/04/2015 18:31    EKG:   Normal sinus rhythm 81 bpm  IMPRESSION AND PLAN:   1. Severe anemia. Iron studies sent off including ferritin, TIBC and B12. ER physician ordered 2 units of packed red blood cells. Benefits and risks of transfusion explained to the patient. If the patient is iron deficiency anemia then she will likely need a GI workup also. I will put on clear liquid diet starting for tomorrow just in case. Patient was guaiac negative in the ER. 2. Essential hypertension continue lisinopril 3. Hyperlipidemia unspecified continue Lipitor 4. Hypothyroidism unspecified continue levothyroxine 5. Anxiety depression continue Zoloft 6 impaired fasting glucose check a hemoglobin A1c  All the records are reviewed and case discussed with ED provider. Management plans discussed with the patient, family and they are in agreement.  CODE STATUS: Full code  TOTAL TIME TAKING CARE OF THIS PATIENT: 50 minutes.    Alford Highland M.D on 07/04/2015 at 6:55 PM  Between 7am to 6pm - Pager - [phone]  After 6pm call admission pager [phone]  [facility]  Office  [phone]  CC: Primary care physician; Leanna Sato, MD

## 2015-07-04 NOTE — ED Provider Notes (Signed)
[facility] Emergency Department Provider Note  ____________________________________________  Time seen: 1745  I have reviewed the triage vital signs and the nursing notes.  History by:  Patient along with her husband.  HISTORY  Chief Complaint Weakness     HPI Elizabeth Shepard is a 75 y.o. female who reports she has been feeling weak, with some shortness of breath and difficulty ambulating, for 6 months, "possibly longer"  Patient reports she hasn't heaviness in her chest. Her husband reports she says she has pressure there. The symptoms have been consistent over the past 6 months. She has not seen a physician about these symptoms. She usually does discuss clinic, but reports she doesn't have a primary physician there and sometimes goes to [place] walk-in.  She denies any black tarry stools. She denies any fever.    Past Medical History  Diagnosis Date  . Hypertension   . Hypercholesteremia   . Thyroid disease   . Generalized pain   . Depression     There are no active problems to display for this patient.   Past Surgical History  Procedure Laterality Date  . Augmentation mammaplasty    . Breast biopsy Right 1970  . Hip surgery    . Tonsillectomy      No current outpatient prescriptions on file.  Allergies Ciprofloxacin  History reviewed. No pertinent family history.  Social History Social History  Substance Use Topics  . Smoking status: Current Some Day Smoker  . Smokeless tobacco: None  . Alcohol Use: No    Review of Systems  Constitutional: General weakness, fatigue, dyspnea on exertion. She history of present illness ENT: Negative for congestion. Cardiovascular: Chest pressure. See history of present illness Respiratory: Shortness of breath, dyspnea on exertion Gastrointestinal: Negative for abdominal pain, vomiting and diarrhea. Genitourinary: Negative for dysuria. Musculoskeletal: No back pain. Skin: Negative for  rash. Neurological: Negative for headache or focal weakness   10-point ROS otherwise negative.  ____________________________________________   PHYSICAL EXAM:  VITAL SIGNS: ED Triage Vitals  Enc Vitals Group     BP 07/04/15 1404 108/44 mmHg     Pulse Rate 07/04/15 1404 84     Resp 07/04/15 1356 20     Temp 07/04/15 1404 98.3 F (36.8 C)     Temp Source 07/04/15 1404 Oral     SpO2 07/04/15 1404 98 %     Weight 07/04/15 1404 168 lb (76.204 kg)     Height 07/04/15 1404  (1.626 m)     Head Cir --      Peak Flow --      Pain Score --      Pain Loc --      Pain Edu? --      Excl. in GC? --     Constitutional:  Alert and oriented. Well appearing and in no distress. ENT   Head: Normocephalic and atraumatic.   Nose: No congestion/rhinnorhea.       Mouth: No erythema, no swelling   Cardiovascular: Normal rate, regular rhythm, no murmur noted Respiratory:  Normal respiratory effort, no tachypnea.    Breath sounds are clear and equal bilaterally.  Gastrointestinal: Soft, no distention. Nontender Rectal: Heme-negative. No melena. Back: No muscle spasm, no tenderness, no CVA tenderness. Musculoskeletal: No deformity noted. Nontender with normal range of motion in all extremities.  No noted edema. Neurologic:  Communicative. Normal appearing spontaneous movement in all 4 extremities. No gross focal neurologic deficits are appreciated.  Skin:  Skin  is warm, dry. No rash noted. Slight pallor noted. Psychiatric: Mood and affect are normal. Speech and behavior are normal.  ____________________________________________    LABS (pertinent positives/negatives)  Labs Reviewed  BASIC METABOLIC PANEL - Abnormal; Notable for the following:    CO2 21 (*)    Glucose, Bld 117 (*)    BUN 23 (*)    Creatinine, Ser 1.07 (*)    Calcium 8.6 (*)    GFR calc non Af Amer 51 (*)    GFR calc Af Amer 59 (*)    All other components within normal limits  CBC - Abnormal; Notable for the  following:    RBC 2.51 (*)    Hemoglobin 5.3 (*)    HCT 17.5 (*)    MCV 69.8 (*)    MCH 21.0 (*)    MCHC 30.2 (*)    RDW 19.7 (*)    Platelets 486 (*)    All other components within normal limits  TROPONIN I  URINALYSIS COMPLETEWITH MICROSCOPIC ([facility] ONLY)     ____________________________________________   EKG  ED ECG REPORT I, Bailyn Spackman W, the attending physician, personally viewed and interpreted this ECG.  ____________________________________________    RADIOLOGY  Chest x-ray:  FINDINGS: Heart and mediastinal contours are within normal limits. No focal opacities or effusions. No acute bony abnormality. Calcified bilateral breast implants noted. No acute bony abnormality.  IMPRESSION: No active cardiopulmonary disease. ____________________________________________   PROCEDURES  CRITICAL CARE Performed by: Darien Ramus   Total critical care time: 40 minutes  Critical care time was exclusive of separately billable procedures and treating other patients.  Critical care was necessary to treat or prevent imminent or life-threatening deterioration.  Critical care was time spent personally by me on the following activities: development of treatment plan with patient and/or surrogate as well as nursing, discussions with consultants, evaluation of patient's response to treatment, examination of patient, obtaining history from patient or surrogate, ordering and performing treatments and interventions, ordering and review of laboratory studies, ordering and review of radiographic studies, pulse oximetry and re-evaluation of patient's condition.   ____________________________________________   INITIAL IMPRESSION / ASSESSMENT AND PLAN / ED COURSE  Pertinent labs & imaging results that were available during my care of the patient were reviewed by me and considered in my medical decision making (see chart for details).  75 year old female with fatigue. She  has a notably low hemoglobin level of 5.3. She denies previous anemia. She denies any black tarry stools. She is heme-negative from below. She does have microcytic anemia.  I have spoken with the patient and her husband about her transfusion, including rest and benefits. They consented to transfusion. We will admit the patient to the hospital for further evaluation and ongoing care.  ____________________________________________   FINAL CLINICAL IMPRESSION(S) / ED DIAGNOSES  Final diagnoses:  Symptomatic anemia  General weakness      Darien Ramus, MD 07/04/15 [id number]

## 2015-07-04 NOTE — ED Notes (Signed)
Assisted pt up to commode and back to bed, pt ambulated with steady gait. NAD noted at this time.

## 2015-07-04 NOTE — ED Notes (Signed)
Attempted to call report, RN unavailable.

## 2015-07-04 NOTE — ED Notes (Addendum)
Pt c/o weakness and dizziness for about 6 months.  Cannot walk as well per pt because of the weakness.  Sx have been constant last 6 months.  C/o pain when breathing and some difficulty breathing; this is worse when moving around.  Feels like it catches under left ribs when takes breath.  Been fatigued. Also wants to be checked for vertigo.  Also wants to be seen for dry mouth.

## 2015-07-04 NOTE — ED Notes (Deleted)
Pt presents with  Migraine for one week. otc meds not helping.

## 2015-07-05 LAB — CBC
HEMATOCRIT: 25.4 % — AB (ref 35.0–47.0)
HEMOGLOBIN: 8.2 g/dL — AB (ref 12.0–16.0)
MCH: 23.4 pg — ABNORMAL LOW (ref 26.0–34.0)
MCHC: 32.5 g/dL (ref 32.0–36.0)
MCV: 72.1 fL — AB (ref 80.0–100.0)
Platelets: 435 10*3/uL (ref 150–440)
RBC: 3.52 MIL/uL — AB (ref 3.80–5.20)
RDW: 19.9 % — AB (ref 11.5–14.5)
WBC: 8.2 10*3/uL (ref 3.6–11.0)

## 2015-07-05 LAB — BASIC METABOLIC PANEL
ANION GAP: 5 (ref 5–15)
BUN: 26 mg/dL — AB (ref 6–20)
CHLORIDE: 105 mmol/L (ref 101–111)
CO2: 25 mmol/L (ref 22–32)
Calcium: 8.6 mg/dL — ABNORMAL LOW (ref 8.9–10.3)
Creatinine, Ser: 1.1 mg/dL — ABNORMAL HIGH (ref 0.44–1.00)
GFR, EST AFRICAN AMERICAN: 57 mL/min — AB (ref >60)
GFR, EST NON AFRICAN AMERICAN: 49 mL/min — AB (ref >60)
Glucose, Bld: 107 mg/dL — ABNORMAL HIGH (ref 65–99)
POTASSIUM: 4.2 mmol/L (ref 3.5–5.1)
SODIUM: 135 mmol/L (ref 135–145)

## 2015-07-05 LAB — VITAMIN B12: VITAMIN B 12: 368 pg/mL (ref 180–914)

## 2015-07-05 MED ORDER — SODIUM CHLORIDE 0.9 % IV SOLN
INTRAVENOUS | Status: DC
  Administered 2015-07-05 (×2): via INTRAVENOUS

## 2015-07-05 MED ORDER — PANTOPRAZOLE SODIUM 40 MG PO TBEC
40.0000 mg | DELAYED_RELEASE_TABLET | Freq: Two times a day (BID) | ORAL | Status: DC
  Administered 2015-07-05 – 2015-07-07 (×5): 40 mg via ORAL
  Filled 2015-07-05 (×7): qty 1

## 2015-07-05 NOTE — Consult Note (Signed)
Please see full GI consult by Ms Henreitta Leber.  Patient admitted with symptomatic anemia, microcytic. Now status post tfx.  Hemodynamically stable, no acute bleeding.  Patient has been taking advil, 2 q 6 hours for over a year related to pain post hip replacement.  Patient is unsure of when her hip replacements were done but states the last one was over 3 years ago.    Will plan for egd tomorrow am.   I have discussed the risks benefits and complications of procedures to include not limited to bleeding, infection, perforation and the risk of sedation and the patient wishes to proceed.Continue ppi as you are. Will need iron supplements on discharge. May also do colonoscopy while inpatient depending on findings of EGD.

## 2015-07-05 NOTE — Progress Notes (Addendum)
[facility] Physicians - [place] at [facility]   PATIENT NAME: Texie Tupou    MR#:  [id number]  DATE OF BIRTH:  [birth year]  SUBJECTIVE:  CHIEF COMPLAINT:   Chief Complaint  Patient presents with  . Weakness   leg cramp.  REVIEW OF SYSTEMS:  CONSTITUTIONAL: No fever, fatigue or weakness.  EYES: No blurred or double vision.  EARS, NOSE, AND THROAT: No tinnitus or ear pain.  RESPIRATORY: No cough, shortness of breath, wheezing or hemoptysis.  CARDIOVASCULAR: No chest pain, orthopnea, edema.  GASTROINTESTINAL: No nausea, vomiting, diarrhea or abdominal pain.  GENITOURINARY: No dysuria, hematuria.  ENDOCRINE: No polyuria, nocturia,  HEMATOLOGY: No anemia, easy bruising or bleeding SKIN: No rash or lesion. MUSCULOSKELETAL: No joint pain or arthritis.   NEUROLOGIC: No tingling, numbness, weakness.  PSYCHIATRY: No anxiety or depression.   DRUG ALLERGIES:   Allergies  Allergen Reactions  . Ciprofloxacin Itching and Rash    VITALS:  Blood pressure 129/61, pulse 78, temperature 98.2 F (36.8 C), temperature source Oral, resp. rate 20, height  (1.626 m), weight 76.204 kg (168 lb), SpO2 98 %.  PHYSICAL EXAMINATION:  GENERAL:  75 y.o.-year-old patient lying in the bed with no acute distress.  EYES: Pupils equal, round, reactive to light and accommodation. No scleral icterus. Extraocular muscles intact.  HEENT: Head atraumatic, normocephalic. Oropharynx and nasopharynx clear.  NECK:  Supple, no jugular venous distention. No thyroid enlargement, no tenderness.  LUNGS: Normal breath sounds bilaterally, no wheezing, rales,rhonchi or crepitation. No use of accessory muscles of respiration.  CARDIOVASCULAR: S1, S2 normal. No murmurs, rubs, or gallops.  ABDOMEN: Soft, nontender, nondistended. Bowel sounds present. No organomegaly or mass.  EXTREMITIES: No pedal edema, cyanosis, or clubbing.  NEUROLOGIC: Cranial nerves II through XII are intact. Muscle strength 5/5 in all  extremities. Sensation intact. Gait not checked.  PSYCHIATRIC: The patient is alert and oriented x 3.  SKIN: No obvious rash, lesion, or ulcer.    LABORATORY PANEL:   CBC  Recent Labs Lab 07/05/15 0655  WBC 8.2  HGB 8.2*  HCT 25.4*  PLT 435   ------------------------------------------------------------------------------------------------------------------  Chemistries   Recent Labs Lab 07/05/15 0655  NA 135  K 4.2  CL 105  CO2 25  GLUCOSE 107*  BUN 26*  CREATININE 1.10*  CALCIUM 8.6*   ------------------------------------------------------------------------------------------------------------------  Cardiac Enzymes  Recent Labs Lab 07/04/15 1417  TROPONINI <0.03   ------------------------------------------------------------------------------------------------------------------  RADIOLOGY:  Dg Chest 2 View  07/04/2015  CLINICAL DATA:  Weakness and dizziness for 6 months. Shortness of breath, fatigue. EXAM: CHEST  2 VIEW COMPARISON:  None. FINDINGS: Heart and mediastinal contours are within normal limits. No focal opacities or effusions. No acute bony abnormality. Calcified bilateral breast implants noted. No acute bony abnormality. IMPRESSION: No active cardiopulmonary disease. Electronically Signed   By: Charlett Nose M.D.   On: 07/04/2015 18:31    EKG:   Orders placed or performed during the hospital encounter of 07/04/15  . ED EKG  . ED EKG    ASSESSMENT AND PLAN:   1. Severe anemia. s/p 2 units of packed red blood cells.  Hb up to 8.2 today. No active bleeding. Per GI, EGD and colonoscopy tomorrow and the day after tomorrow. Start iron after discharge. Started Protonix twice a day. 2. Essential hypertension,  continue lisinopril 3. Hyperlipidemia unspecified, continue Lipitor 4. Hypothyroidism unspecified continue levothyroxine 5. Anxiety depression continue Zoloft  * Dehydration, start normal saline IV and follow-up BMP.   All the records  are  reviewed and case discussed with Care Management/Social Workerr. Management plans discussed with the patient, family and they are in agreement.  CODE STATUS: full code.  TOTAL TIME TAKING CARE OF THIS PATIENT: 35 minutes.  Greater than 50% time was spent on coordination of care and face-to-face counseling.  POSSIBLE D/C IN 2 DAYS, DEPENDING ON CLINICAL CONDITION.   Shaune Pollack M.D on 07/05/2015 at 2:05 PM  Between 7am to 6pm - Pager - [phone]  After 6pm go to [email or web address] - password [email or web address]  [place] [facility]  Office  [phone]  CC: Primary care physician; Leanna Sato, MD

## 2015-07-05 NOTE — Consult Note (Signed)
GI Inpatient Consult Note  Reason for Consult: Anemia    Attending Requesting Consult: Dr. Hilton Sinclair  History of Present Illness: Elizabeth Shepard is a 75 y.o. female seen for evaluation of Anemia at the request of Dr. Hilton Sinclair.   She is a new patient to [facility]. She reports about 6 months ago developing weakness and SOB. This has been worsening recently which prompted her to come to ED. She is having CP with activities such as carrying groceries. She developed darker stools around 2 weeks ago. Stools were black for about 3 days and took Pepto. Last BM yesterday was brown. She is having a BM most days. She denies constipation except when she takes Tramadol. Otherwise stools are soft. She denies diarrhea. No obvious BRBPR.   She is having occasional GERD symptoms-takes Tums 3x/month on average. She had 1 day of n/v in past 2 weeks but feels this is related to something she ate. No hematemesis. No dysphagia/odynophagia. Her weight is up #30lbs in 2 years. No post prandial abd pain. She is using NSAIDS daily, usually aleve. Not on PPI at home.   Last Colonoscopy: None prior   Last Endoscopy: Unclear if prior - questionable in [place] approximately 10 years ago but can not recall why done or findings.    Past Medical History:  Past Medical History  Diagnosis Date  . Hypertension   . Hypercholesteremia   . Thyroid disease   . Generalized pain   . Depression   . Hypothyroidism     Problem List: Patient Active Problem List   Diagnosis Date Noted  . Anemia 07/04/2015    Past Surgical History: Past Surgical History  Procedure Laterality Date  . Augmentation mammaplasty    . Breast biopsy Right 1970  . Hip surgery    . Tonsillectomy    . Total hip arthroplasty Bilateral   . Mastoid surgery      Allergies: Allergies  Allergen Reactions  . Ciprofloxacin Itching and Rash    Home Medications: Prescriptions prior to admission  Medication Sig Dispense Refill Last Dose  .  atorvastatin (LIPITOR) 40 MG tablet Take 40 mg by mouth at bedtime.    07/03/2015 at Unknown time  . cetirizine (ZYRTEC) 10 MG tablet Take 10 mg by mouth daily as needed for allergies.   Past Week at Unknown time  . conjugated estrogens (PREMARIN) vaginal cream Place 1 Applicatorful vaginally every 3 (three) days.   07/03/2015 at Unknown time  . levothyroxine (SYNTHROID, LEVOTHROID) 25 MCG tablet Take 25 mcg by mouth daily before breakfast.   07/04/2015 at Unknown time  . lisinopril (PRINIVIL,ZESTRIL) 10 MG tablet Take 10 mg by mouth daily.   07/04/2015 at Unknown time  . sertraline (ZOLOFT) 100 MG tablet Take 100 mg by mouth at bedtime.   07/03/2015 at Unknown time  . traMADol (ULTRAM) 50 MG tablet Take 50 mg by mouth every 6 (six) hours as needed for moderate pain.   07/03/2015 at 1300   Home medication reconciliation was completed with the patient.   Scheduled Inpatient Medications:   . atorvastatin  40 mg Oral QHS  . conjugated estrogens  1 Applicatorful Vaginal Q3 days  . levothyroxine  25 mcg Oral QAC breakfast  . lisinopril  10 mg Oral Daily  . loratadine  10 mg Oral Daily  . pantoprazole  40 mg Oral BID AC  . sertraline  100 mg Oral QHS    Continuous Inpatient Infusions:   . sodium chloride 75 mL/hr  at 07/05/15 1124    PRN Inpatient Medications:  acetaminophen **OR** acetaminophen, traMADol  Family History: family history includes CAD in her father and mother; Diabetes in her father and mother.  She denies family history of colon cancer or colon polyps.   Social History:   reports that she has quit smoking. She does not have any smokeless tobacco history on file. She reports that she does not drink alcohol or use illicit drugs.   Review of Systems: Constitutional: + weight gain  Eyes: No changes in vision. ENT: No oral lesions, sore throat.  GI: see HPI.  Heme/Lymph: No easy bruising.  CV: No chest pain.  GU: No hematuria.  Integumentary: No rashes.  Neuro: No headaches.   Psych: No depression/anxiety.  Endocrine: No heat/cold intolerance.  Allergic/Immunologic: No urticaria.  Resp: No cough, SOB.  Musculoskeletal: + back pain   Physical Examination: BP 137/67 mmHg  Pulse 72  Temp(Src) 98.2 F (36.8 C) (Oral)  Resp 20  Ht  (1.626 m)  Wt 168 lb (76.204 kg)  BMI 28.82 kg/m2  SpO2 100% Gen: NAD, alert and oriented x 4 HEENT: PEERLA, EOMI, Neck: supple, no JVD or thyromegaly Chest: CTA bilaterally, no wheezes, crackles, or other adventitious sounds CV: RRR, no m/g/c/r Abd: soft, NT, ND, +BS in all four quadrants; no HSM, guarding, ridigity, or rebound tenderness Rectal-no stool in rectal vault.  Ext: no edema, well perfused with 2+ pulses, Skin: no rash or lesions noted Lymph: no LAD  Data: Lab Results  Component Value Date   WBC 8.2 07/05/2015   HGB 8.2* 07/05/2015   HCT 25.4* 07/05/2015   MCV 72.1* 07/05/2015   PLT 435 07/05/2015    Recent Labs Lab 07/04/15 1417 07/05/15 0655  HGB 5.3* 8.2*   Lab Results  Component Value Date   NA 135 07/05/2015   K 4.2 07/05/2015   CL 105 07/05/2015   CO2 25 07/05/2015   BUN 26* 07/05/2015   CREATININE 1.10* 07/05/2015   Lab Results  Component Value Date   ALT 18 08/03/2009   AST 25 08/03/2009   ALKPHOS 62 08/03/2009   BILITOT 0.7 08/03/2009    Recent Labs Lab 07/04/15 1417  INR 1.11   Assessment/Plan:  Ms. Alewine is a 75 y.o. female admitted w/ SOB and weakness x 6 months, worse in past 2 weeks.   1. Anemia - severe, iron deficiency. Hgb improved after transfusion from 5.3-->8.2. Low ferritin at 5. She has had darker stools, heme negative in ER and no stool in rectal vault today. No prior EGD/colonoscopy. Certainly daily NSAID use is worrisome for peptic ulcer disease or erosive gastritis. Agree with PPI BID. Needs EGD/colonoscopy for further evaluation. Plan for EGD tomorrow and colonoscopy day following tomorrow (Friday). Will need to be started on iron supplement at  discharge.    Recommendations: 1. EGD tomorrow 2. NPO at midnight 3. Agree w/ continuing PPI BID.    Case discussed w/ Dr. Marva Panda.   Thank you for the consult. Please call with questions or concerns.  Bluford Kaufmann, PA-C [facility]

## 2015-07-05 NOTE — Plan of Care (Signed)
Problem: Education: Goal: Knowledge of [facility] Education information/materials will improve Outcome: Progressing Pt is alert and oriented, repeats questions at times. Pt was very hungry, ate two [place] trays and several snacks.   Problem: Safety: Goal: Ability to remain free from injury will improve Outcome: Progressing Moderate fall risk, independent in room. Calls for assistance when needed. Steady gait.  Problem: Pain Managment: Goal: General experience of comfort will improve Outcome: Progressing Pt c/o headache, tramadol given with effect. Continue to assess.  Problem: Fluid Volume: Goal: Ability to maintain a balanced intake and output will improve Outcome: Progressing Patient is receiving 2 units of RBCs, tolerating well. Patient pre-medicated with Benadryl and tylenol. IV lasix given between doses.

## 2015-07-06 ENCOUNTER — Encounter: Payer: Self-pay | Admitting: *Deleted

## 2015-07-06 ENCOUNTER — Inpatient Hospital Stay: Payer: Medicare Other | Admitting: Anesthesiology

## 2015-07-06 ENCOUNTER — Encounter
Admission: EM | Disposition: A | Discharge status: Home / Self Care | Payer: Self-pay | Source: Home / Self Care | Attending: Internal Medicine

## 2015-07-06 HISTORY — PX: ESOPHAGOGASTRODUODENOSCOPY: SHX5428

## 2015-07-06 LAB — TYPE AND SCREEN
ABO/RH(D): O POS
Antibody Screen: NEGATIVE
UNIT DIVISION: 0
UNIT DIVISION: 0
Unit division: 0
Unit division: 0

## 2015-07-06 LAB — BASIC METABOLIC PANEL
Anion gap: 7 (ref 5–15)
BUN: 14 mg/dL (ref 6–20)
CALCIUM: 8.6 mg/dL — AB (ref 8.9–10.3)
CHLORIDE: 110 mmol/L (ref 101–111)
CO2: 23 mmol/L (ref 22–32)
Creatinine, Ser: 0.92 mg/dL (ref 0.44–1.00)
GFR calc Af Amer: >60 mL/min (ref >60)
GFR calc non Af Amer: >60 mL/min (ref >60)
Glucose, Bld: 96 mg/dL (ref 65–99)
Potassium: 3.9 mmol/L (ref 3.5–5.1)
SODIUM: 140 mmol/L (ref 135–145)

## 2015-07-06 LAB — CBC
HCT: 24.9 % — ABNORMAL LOW (ref 35.0–47.0)
HEMOGLOBIN: 7.7 g/dL — AB (ref 12.0–16.0)
MCH: 22.7 pg — AB (ref 26.0–34.0)
MCHC: 31.1 g/dL — ABNORMAL LOW (ref 32.0–36.0)
MCV: 73 fL — ABNORMAL LOW (ref 80.0–100.0)
Platelets: 378 10*3/uL (ref 150–440)
RBC: 3.41 MIL/uL — AB (ref 3.80–5.20)
RDW: 20 % — ABNORMAL HIGH (ref 11.5–14.5)
WBC: 5.8 10*3/uL (ref 3.6–11.0)

## 2015-07-06 SURGERY — ESOPHAGOGASTRODUODENOSCOPY (EGD) WITH PROPOFOL
Anesthesia: General

## 2015-07-06 SURGERY — EGD (ESOPHAGOGASTRODUODENOSCOPY)
Anesthesia: General

## 2015-07-06 MED ORDER — PROPOFOL 500 MG/50ML IV EMUL
INTRAVENOUS | Status: DC | PRN
  Administered 2015-07-06: 160 ug/kg/min via INTRAVENOUS

## 2015-07-06 MED ORDER — PROPOFOL 10 MG/ML IV BOLUS
INTRAVENOUS | Status: DC | PRN
  Administered 2015-07-06: 50 mg via INTRAVENOUS
  Administered 2015-07-06: 10 mg via INTRAVENOUS

## 2015-07-06 MED ORDER — LIDOCAINE HCL (CARDIAC) 20 MG/ML IV SOLN
INTRAVENOUS | Status: DC | PRN
  Administered 2015-07-06: 60 mg via INTRAVENOUS

## 2015-07-06 MED ORDER — FERROUS SULFATE 325 (65 FE) MG PO TABS
325.0000 mg | ORAL_TABLET | Freq: Three times a day (TID) | ORAL | Status: DC
  Administered 2015-07-07 (×3): 325 mg via ORAL
  Filled 2015-07-06 (×3): qty 1

## 2015-07-06 MED ORDER — MIDAZOLAM HCL 2 MG/2ML IJ SOLN
INTRAMUSCULAR | Status: DC | PRN
  Administered 2015-07-06: 1 mg via INTRAVENOUS

## 2015-07-06 NOTE — Op Note (Signed)
[facility] Gastroenterology Patient Name: Elizabeth Shepard Procedure Date: 07/06/2015 9:59 AM MRN: [id number] Account #: [id number] Date of Birth: 29-Nov-1940 Admit Type: Inpatient Age: 75 Room: [facility] ENDO ROOM 3 Gender: Female Note Status: Finalized Procedure:         Upper GI endoscopy Indications:       Anemia, Iron deficiency anemia, Melena Providers:         Christena Deem, MD Referring MD:      Leanna Sato, MD (Referring MD) Medicines:         Monitored Anesthesia Care Complications:     No immediate complications. Procedure:         Pre-Anesthesia Assessment:                    - ASA Grade Assessment: II - A patient with mild systemic                     disease.                    After obtaining informed consent, the endoscope was passed                     under direct vision. Throughout the procedure, the                     patient's blood pressure, pulse, and oxygen saturations                     were monitored continuously. The Endoscope was introduced                     through the mouth, and advanced to the third part of                     duodenum. The upper GI endoscopy was accomplished without                     difficulty. The patient tolerated the procedure well. Findings:      LA Grade D (one or more mucosal breaks involving at least 75% of       esophageal circumference) esophagitis with no bleeding was found.      Two non-obstructing non-bleeding cratered gastric ulcers of moderate to       significant severity with no stigmata of bleeding were found on the       lesser curvature of the stomach. The largest lesion was 10 mm in largest       dimension.      Multiple dispersed, medium-sized non-bleeding erosions were found on the       greater curvature of the stomach and in the gastric antrum. There were       no stigmata of recent bleeding.      A medium-sized hiatus hernia was found. The Z-line was a variable       distance from  incisors; the hiatal hernia was sliding.      The examined duodenum was normal.      The cardia and gastric fundus were normal on retroflexion otherwise. Impression:        - LA Grade D erosive esophagitis.                    - Non-obstructing gastric ulcers with clean base.  Suspicious for NSAID induced etiology.                    - Non-bleeding erosive gastropathy.                    - Medium-sized hiatus hernia.                    - No specimens collected. Recommendation:    - Return patient to hospital ward for ongoing care.                    - Use Protonix (pantoprazole) 40 mg PO BID daily.                    - Repeat the upper endoscopy in 6 weeks to check healing.                    - No aspirin, ibuprofen, naproxen, or other non-steroidal                     anti-inflammatory drugs.                    - Perform an H. pylori serology today. Procedure Code(s): --- Professional ---                    [phone], Esophagogastroduodenoscopy, flexible, transoral;                     diagnostic, including collection of specimen(s) by                     brushing or washing, when performed (separate procedure) Diagnosis Code(s): --- Professional ---                    K20.8, Other esophagitis                    K25.9, Gastric ulcer, unspecified as acute or chronic,                     without hemorrhage or perforation                    K31.89, Other diseases of stomach and duodenum                    K44.9, Diaphragmatic hernia without obstruction or gangrene                    D64.9, Anemia, unspecified                    D50.9, Iron deficiency anemia, unspecified                    K92.1, Melena CPT copyright 2014 American Medical Association. All rights reserved. The codes documented in this report are preliminary and upon coder review may  be revised to meet current compliance requirements. Christena Deem, MD 07/06/2015 10:31:03 AM This report has been signed  electronically. Number of Addenda: 0 Note Initiated On: 07/06/2015 9:59 AM      [facility]

## 2015-07-06 NOTE — Care Management Important Message (Signed)
Important Message  Patient Details  Name: Elizabeth Shepard MRN: [id number] Date of Birth: 11-24-1940   Medicare Important Message Given:  Yes    Olegario Messier A Kaniyah Lisby 07/06/2015, 1:33 PM

## 2015-07-06 NOTE — Anesthesia Postprocedure Evaluation (Signed)
Anesthesia Post Note  Patient: Elizabeth Shepard  Procedure(s) Performed: Procedure(s) (LRB): ESOPHAGOGASTRODUODENOSCOPY (EGD) (N/A)  Patient location during evaluation: Endoscopy Anesthesia Type: General Level of consciousness: awake and alert Pain management: pain level controlled Vital Signs Assessment: post-procedure vital signs reviewed and stable Respiratory status: spontaneous breathing and respiratory function stable Cardiovascular status: stable Anesthetic complications: no    Last Vitals:  Filed Vitals:   07/06/15 1131 07/06/15 1508  BP: 123/52 118/64  Pulse: 60 63  Temp: 36.6 C 36.7 C  Resp: 20 20    Last Pain:  Filed Vitals:   07/06/15 1508  PainSc: 0-No pain                 Starasia Sinko K

## 2015-07-06 NOTE — Progress Notes (Signed)
[facility] Physicians -  at [facility]   PATIENT NAME: Elizabeth Shepard    MR#:  [id number]  DATE OF BIRTH:  04-Feb-1941  SUBJECTIVE:  CHIEF COMPLAINT:   Chief Complaint  Patient presents with  . Weakness   no complaint, status post EGD today.  REVIEW OF SYSTEMS:  CONSTITUTIONAL: No fever, fatigue or weakness.  EYES: No blurred or double vision.  EARS, NOSE, AND THROAT: No tinnitus or ear pain.  RESPIRATORY: No cough, shortness of breath, wheezing or hemoptysis.  CARDIOVASCULAR: No chest pain, orthopnea, edema.  GASTROINTESTINAL: No nausea, vomiting, diarrhea or abdominal pain.  GENITOURINARY: No dysuria, hematuria.  ENDOCRINE: No polyuria, nocturia,  HEMATOLOGY: No anemia, easy bruising or bleeding SKIN: No rash or lesion. MUSCULOSKELETAL: No joint pain or arthritis.   NEUROLOGIC: No tingling, numbness, weakness.  PSYCHIATRY: No anxiety or depression.   DRUG ALLERGIES:   Allergies  Allergen Reactions  . Ciprofloxacin Itching and Rash    VITALS:  Blood pressure 118/64, pulse 63, temperature 98.1 F (36.7 C), temperature source Oral, resp. rate 20, height  (1.626 m), weight 76.204 kg (168 lb), SpO2 94 %.  PHYSICAL EXAMINATION:  GENERAL:  75 y.o.-year-old patient lying in the bed with no acute distress.  EYES: Pupils equal, round, reactive to light and accommodation. No scleral icterus. Extraocular muscles intact.  HEENT: Head atraumatic, normocephalic. Oropharynx and nasopharynx clear.  NECK:  Supple, no jugular venous distention. No thyroid enlargement, no tenderness.  LUNGS: Normal breath sounds bilaterally, no wheezing, rales,rhonchi or crepitation. No use of accessory muscles of respiration.  CARDIOVASCULAR: S1, S2 normal. No murmurs, rubs, or gallops.  ABDOMEN: Soft, nontender, nondistended. Bowel sounds present. No organomegaly or mass.  EXTREMITIES: No pedal edema, cyanosis, or clubbing.  NEUROLOGIC: Cranial nerves II through XII are intact.  Muscle strength 5/5 in all extremities. Sensation intact. Gait not checked.  PSYCHIATRIC: The patient is alert and oriented x 3.  SKIN: No obvious rash, lesion, or ulcer.    LABORATORY PANEL:   CBC  Recent Labs Lab 07/06/15 0606  WBC 5.8  HGB 7.7*  HCT 24.9*  PLT 378   ------------------------------------------------------------------------------------------------------------------  Chemistries   Recent Labs Lab 07/06/15 0606  NA 140  K 3.9  CL 110  CO2 23  GLUCOSE 96  BUN 14  CREATININE 0.92  CALCIUM 8.6*   ------------------------------------------------------------------------------------------------------------------  Cardiac Enzymes  Recent Labs Lab 07/04/15 1417  TROPONINI <0.03   ------------------------------------------------------------------------------------------------------------------  RADIOLOGY:  Dg Chest 2 View  07/04/2015  CLINICAL DATA:  Weakness and dizziness for 6 months. Shortness of breath, fatigue. EXAM: CHEST  2 VIEW COMPARISON:  None. FINDINGS: Heart and mediastinal contours are within normal limits. No focal opacities or effusions. No acute bony abnormality. Calcified bilateral breast implants noted. No acute bony abnormality. IMPRESSION: No active cardiopulmonary disease. Electronically Signed   By: Charlett Nose M.D.   On: 07/04/2015 18:31    EKG:   Orders placed or performed during the hospital encounter of 07/04/15  . ED EKG  . ED EKG    ASSESSMENT AND PLAN:   1. Severe anemia. s/p 2 units of packed red blood cells.  Hb up to 8.2 yesterday and down to 7.7 today. No active bleeding. Follow-up hemoglobin in a.m.  EGD shows LA Grade D erosive esophagitis. Non-obstructing gastric ulcers with clean base. continue Protonix 40 mg by mouth twice a day per Dr. Paulla Fore. postpone colonoscopy as outpatient. Start iron.  avoid NSAIDS.  2. Essential hypertension,  continue lisinopril 3.  Hyperlipidemia unspecified, continue Lipitor 4.  Hypothyroidism unspecified continue levothyroxine 5. Anxiety depression continue Zoloft  * Dehydration, improved with  normal saline IV.   All the records are reviewed and case discussed with Care Management/Social Workerr. Management plans discussed with the patient, family and they are in agreement.  CODE STATUS: full code.  TOTAL TIME TAKING CARE OF THIS PATIENT: 35 minutes.  Greater than 50% time was spent on coordination of care and face-to-face counseling.  POSSIBLE D/C IN 2 DAYS, DEPENDING ON CLINICAL CONDITION.   Shaune Pollack M.D on 07/06/2015 at 4:17 PM  Between 7am to 6pm - Pager - [phone]  After 6pm go to [email or web address] - password [email or web address] [facility]  [address] [place] [facility]  Office  [phone]  CC: Primary care physician; Leanna Sato, MD

## 2015-07-06 NOTE — Anesthesia Preprocedure Evaluation (Addendum)
Anesthesia Evaluation  Patient identified by MRN, date of birth, ID band  Reviewed: Allergy & Precautions, NPO status , Patient's Chart, lab work & pertinent test results  History of Anesthesia Complications Negative for: history of anesthetic complications  Airway Mallampati: III       Dental   Pulmonary neg pulmonary ROS,           Cardiovascular hypertension, Pt. on medications      Neuro/Psych Depression negative neurological ROS     GI/Hepatic negative GI ROS, Neg liver ROS,   Endo/Other  Hypothyroidism   Renal/GU negative Renal ROS     Musculoskeletal   Abdominal   Peds  Hematology negative hematology ROS (+) anemia ,   Anesthesia Other Findings   Reproductive/Obstetrics                            Anesthesia Physical Anesthesia Plan  ASA: III  Anesthesia Plan: General   Post-op Pain Management:    Induction: Intravenous  Airway Management Planned: Nasal Cannula  Additional Equipment:   Intra-op Plan:   Post-operative Plan:   Informed Consent: I have reviewed the patients History and Physical, chart, labs and discussed the procedure including the risks, benefits and alternatives for the proposed anesthesia with the patient or authorized representative who has indicated his/her understanding and acceptance.     Plan Discussed with:   Anesthesia Plan Comments:         Anesthesia Quick Evaluation

## 2015-07-06 NOTE — Consult Note (Signed)
Subjective: Patient seen for anemia.  Patient denies nausea or abdominal pain.  No bm or emesis overnight.   Objective: Vital signs in last 24 hours: Temp:  [97.9 F (36.6 C)-98.2 F (36.8 C)] 98 F (36.7 C) (01/19 0506) Pulse Rate:  [65-78] 65 (01/19 0506) Resp:  [17-20] 17 (01/19 0506) BP: (114-129)/(49-61) 114/49 mmHg (01/19 0506) SpO2:  [96 %-98 %] 96 % (01/19 0506) Blood pressure 114/49, pulse 65, temperature 98 F (36.7 C), temperature source Oral, resp. rate 17, height  (1.626 m), weight 76.204 kg (168 lb), SpO2 96 %.   Intake/Output from previous day: 01/18 0701 - 01/19 0700 In: 760 [P.O.:760] Out: 2200 [Urine:2200]  Intake/Output this shift:     General appearance:  Well appearing f no distress Resp:  bcta Cardio:  rrr without rub or gallop GI:  Soft nd/nt/bowel sounds positive normoactive.  Extremities:  No cce   Lab Results: Results for orders placed or performed during the hospital encounter of 07/04/15 (from the past 24 hour(s))  Basic metabolic panel     Status: Abnormal   Collection Time: 07/06/15  6:06 AM  Result Value Ref Range   Sodium 140 135 - 145 mmol/L   Potassium 3.9 3.5 - 5.1 mmol/L   Chloride 110 101 - 111 mmol/L   CO2 23 22 - 32 mmol/L   Glucose, Bld 96 65 - 99 mg/dL   BUN 14 6 - 20 mg/dL   Creatinine, Ser 7.82 0.44 - 1.00 mg/dL   Calcium 8.6 (L) 8.9 - 10.3 mg/dL   GFR calc non Af Amer >60 >60 mL/min   GFR calc Af Amer >60 >60 mL/min   Anion gap 7 5 - 15  CBC     Status: Abnormal   Collection Time: 07/06/15  6:06 AM  Result Value Ref Range   WBC 5.8 3.6 - 11.0 K/uL   RBC 3.41 (L) 3.80 - 5.20 MIL/uL   Hemoglobin 7.7 (L) 12.0 - 16.0 g/dL   HCT 95.6 (L) 21.3 - 08.6 %   MCV 73.0 (L) 80.0 - 100.0 fL   MCH 22.7 (L) 26.0 - 34.0 pg   MCHC 31.1 (L) 32.0 - 36.0 g/dL   RDW 57.8 (H) 46.9 - 62.9 %   Platelets 378 150 - 440 K/uL      Recent Labs  07/04/15 1417 07/05/15 0655 07/06/15 0606  WBC 6.9 8.2 5.8  HGB 5.3* 8.2* 7.7*  HCT  17.5* 25.4* 24.9*  PLT 486* 435 378   BMET  Recent Labs  07/04/15 1417 07/05/15 0655 07/06/15 0606  NA 135 135 140  K 4.7 4.2 3.9  CL 105 105 110  CO2 21* 25 23  GLUCOSE 117* 107* 96  BUN 23* 26* 14  CREATININE 1.07* 1.10* 0.92  CALCIUM 8.6* 8.6* 8.6*   LFT No results for input(s): PROT, ALBUMIN, AST, ALT, ALKPHOS, BILITOT, BILIDIR, IBILI in the last 72 hours. PT/INR  Recent Labs  07/04/15 1417  LABPROT 14.5  INR 1.11   Hepatitis Panel No results for input(s): HEPBSAG, HCVAB, HEPAIGM, HEPBIGM in the last 72 hours. C-Diff No results for input(s): CDIFFTOX in the last 72 hours. No results for input(s): CDIFFPCR in the last 72 hours.   Studies/Results: Dg Chest 2 View  07/04/2015  CLINICAL DATA:  Weakness and dizziness for 6 months. Shortness of breath, fatigue. EXAM: CHEST  2 VIEW COMPARISON:  None. FINDINGS: Heart and mediastinal contours are within normal limits. No focal opacities or effusions. No acute bony abnormality.  Calcified bilateral breast implants noted. No acute bony abnormality. IMPRESSION: No active cardiopulmonary disease. Electronically Signed   By: Charlett Nose M.D.   On: 07/04/2015 18:31    Scheduled Inpatient Medications:   . atorvastatin  40 mg Oral QHS  . conjugated estrogens  1 Applicatorful Vaginal Q3 days  . levothyroxine  25 mcg Oral QAC breakfast  . lisinopril  10 mg Oral Daily  . loratadine  10 mg Oral Daily  . pantoprazole  40 mg Oral BID AC  . sertraline  100 mg Oral QHS    Continuous Inpatient Infusions:   . sodium chloride 75 mL/hr at 07/05/15 1944    PRN Inpatient Medications:  acetaminophen **OR** acetaminophen, traMADol  Miscellaneous:   Assessment:  1) anemia in the setting of marked nsaid use, iron def and history of dark stools. Currently on bid ppi, did not take ppi this am for some reason ?refused.   Plan:  1) egd today.   I have discussed the risks benefits and complications of procedures to include not limited  to bleeding, infection, perforation and the risk of sedation and the patient wishes to proceed.further recs to follow,.   Christena Deem MD 07/06/2015, 9:38 AM

## 2015-07-06 NOTE — Plan of Care (Addendum)
VSS, afebrile. Awaiting BM for occult stool sample. NPO since midnight for EGD today.   Problem: Safety: Goal: Ability to remain free from injury will improve Outcome: Progressing Moderate fall risk. Safely ambulates in room independently. Safe environment provided. Pt understands how to use call system for assistance.   Problem: Pain Managment: Goal: General experience of comfort will improve Outcome: Progressing C/o headache relieved by PRN Tramadol.  Problem: Fluid Volume: Goal: Ability to maintain a balanced intake and output will improve Outcome: Progressing IVF infusing as ordered

## 2015-07-06 NOTE — Transfer of Care (Signed)
Immediate Anesthesia Transfer of Care Note  Patient: Elizabeth Shepard  Procedure(s) Performed: Procedure(s): ESOPHAGOGASTRODUODENOSCOPY (EGD) (N/A)  Patient Location: PACU  Anesthesia Type:General  Level of Consciousness: sedated  Airway & Oxygen Therapy: Patient Spontanous Breathing and Patient connected to nasal cannula oxygen  Post-op Assessment: Report given to RN  Post vital signs: Reviewed and stable  Last Vitals:  Filed Vitals:   07/06/15 0951 07/06/15 1030  BP: 130/50 104/58  Pulse: 62 66  Temp: 37.1 C 36.3 C  Resp: 14 17    Complications: No apparent anesthesia complications

## 2015-07-07 LAB — CBC
HEMATOCRIT: 25 % — AB (ref 35.0–47.0)
HEMOGLOBIN: 7.5 g/dL — AB (ref 12.0–16.0)
MCH: 22.3 pg — ABNORMAL LOW (ref 26.0–34.0)
MCHC: 29.8 g/dL — ABNORMAL LOW (ref 32.0–36.0)
MCV: 74.8 fL — ABNORMAL LOW (ref 80.0–100.0)
Platelets: 370 10*3/uL (ref 150–440)
RBC: 3.34 MIL/uL — AB (ref 3.80–5.20)
RDW: 20.4 % — ABNORMAL HIGH (ref 11.5–14.5)
WBC: 5.7 10*3/uL (ref 3.6–11.0)

## 2015-07-07 MED ORDER — PANTOPRAZOLE SODIUM 40 MG PO TBEC: 40.0000 mg | DELAYED_RELEASE_TABLET | Freq: Two times a day (BID) | ORAL | Status: DC

## 2015-07-07 MED ORDER — FERROUS SULFATE 325 (65 FE) MG PO TABS: 325.0000 mg | ORAL_TABLET | Freq: Three times a day (TID) | ORAL | Status: DC

## 2015-07-07 NOTE — Discharge Instructions (Signed)
Soft diet for 1 week. Heart healthy diet. Activity as tolerated. Avoid NSAIDS Follow up Dr. Marva Panda for colonoscopy and repeat EGD in 7 weeks.

## 2015-07-07 NOTE — Plan of Care (Signed)
Family member arrived and belongings sent with patient and family. Discharged via wheelchair by nursing staff.

## 2015-07-07 NOTE — Plan of Care (Signed)
VSS, afebrile. Awaiting BM for occult stool sample. Hgb 7.7 today, will be rechecked this AM. Possible d/c today. Pt would like to speak w/ Dr. Marva Panda before discharge.   Problem: Safety: Goal: Ability to remain free from injury will improve Outcome: Progressing Moderate fall risk. Safely ambulates in room independently. Safe environment provided. Pt understands how to use call system for assistance.   Problem: Pain Managment: Goal: General experience of comfort will improve Outcome: Progressing C/o headache relieved by PRN Tramadol.

## 2015-07-07 NOTE — Progress Notes (Signed)
MD making rounds. Discharge orders received. Appointments Scheduled. Telemetry Removed. IV removed. Prescriptions given to patient. Discharge paperwork provided, explained, signed and witnessed. Education handouts provided to patient. No unanswered questions.Awaiting family member for transport home.

## 2015-07-07 NOTE — Consult Note (Signed)
Subjective: Patient seen for anemia, GI blood loss in the setting of NSAID use. Patient doing well. There's been no nausea vomiting or abdominal pain. She's had no bowel movement since admission. Hemodynamically stable. Hemoglobin stable. She is tolerating regular diet.  Objective: Vital signs in last 24 hours: Temp:  [98.1 F (36.7 C)-98.2 F (36.8 C)] 98.2 F (36.8 C) (01/20 1003) Pulse Rate:  [63-69] 69 (01/20 1003) Resp:  [20] 20 (01/20 1003) BP: (118-144)/(56-64) 144/56 mmHg (01/20 1003) SpO2:  [94 %-97 %] 97 % (01/20 1003) Blood pressure 144/56, pulse 69, temperature 98.2 F (36.8 C), temperature source Oral, resp. rate 20, height  (1.626 m), weight 76.204 kg (168 lb), SpO2 97 %.   Intake/Output from previous day: 01/19 0701 - 01/20 0700 In: 1700 [P.O.:1200; I.V.:500] Out: 300 [Urine:300]  Intake/Output this shift: Total I/O In: 600 [P.O.:600] Out: -    General appearance:  76 female no distress Resp:  Bilaterally clear to auscultation Cardio:  Regular rate and rhythm without rub or gallop GI:  Soft nontender nondistended bowel sounds positive normoactive Extremities:  No clubbing cyanosis or edema   Lab Results: Results for orders placed or performed during the hospital encounter of 07/04/15 (from the past 24 hour(s))  CBC     Status: Abnormal   Collection Time: 07/07/15  6:17 AM  Result Value Ref Range   WBC 5.7 3.6 - 11.0 K/uL   RBC 3.34 (L) 3.80 - 5.20 MIL/uL   Hemoglobin 7.5 (L) 12.0 - 16.0 g/dL   HCT 16.1 (L) 09.6 - 04.5 %   MCV 74.8 (L) 80.0 - 100.0 fL   MCH 22.3 (L) 26.0 - 34.0 pg   MCHC 29.8 (L) 32.0 - 36.0 g/dL   RDW 40.9 (H) 81.1 - 91.4 %   Platelets 370 150 - 440 K/uL      Recent Labs  07/05/15 0655 07/06/15 0606 07/07/15 0617  WBC 8.2 5.8 5.7  HGB 8.2* 7.7* 7.5*  HCT 25.4* 24.9* 25.0*  PLT 435 378 370   BMET  Recent Labs  07/04/15 1417 07/05/15 0655 07/06/15 0606  NA 135 135 140  K 4.7 4.2 3.9  CL 105 105 110  CO2 21* 25  23  GLUCOSE 117* 107* 96  BUN 23* 26* 14  CREATININE 1.07* 1.10* 0.92  CALCIUM 8.6* 8.6* 8.6*   LFT No results for input(s): PROT, ALBUMIN, AST, ALT, ALKPHOS, BILITOT, BILIDIR, IBILI in the last 72 hours. PT/INR  Recent Labs  07/04/15 1417  LABPROT 14.5  INR 1.11   Hepatitis Panel No results for input(s): HEPBSAG, HCVAB, HEPAIGM, HEPBIGM in the last 72 hours. C-Diff No results for input(s): CDIFFTOX in the last 72 hours. No results for input(s): CDIFFPCR in the last 72 hours.   Studies/Results: No results found.  Scheduled Inpatient Medications:   . atorvastatin  40 mg Oral QHS  . conjugated estrogens  1 Applicatorful Vaginal Q3 days  . ferrous sulfate  325 mg Oral TID WC  . levothyroxine  25 mcg Oral QAC breakfast  . lisinopril  10 mg Oral Daily  . loratadine  10 mg Oral Daily  . pantoprazole  40 mg Oral BID AC  . sertraline  100 mg Oral QHS    Continuous Inpatient Infusions:     PRN Inpatient Medications:  acetaminophen **OR** acetaminophen, traMADol  Miscellaneous:   Assessment:  1. Anemia. Patient with history of daily, excessive NSAID use. NSAID gastropathy as well as 2 significant ulcers noted on EGD. No active  bleeding. 2. Iron deficiency anemia please see 1 above.  Plan:  1. Continue twice a day PPI. Soft diet for at least another week. Strict NSAID and aspirin avoidance. Follow up in 2 weeks or so as an outpatient need to recheck a hemoglobin at that time. Also arrange for repeat EGD in about 7 weeks. That to be followed by colonoscopy as outpatient. Extensive discussion with patient in regard to management and rationale. All questions satisfied.  Christena Deem MD 07/07/2015, 1:40 PM

## 2015-07-08 NOTE — Discharge Summary (Signed)
[facility] [place] [facility]   PATIENT NAME: Elizabeth Shepard    MR#:  [id number]  DATE OF BIRTH:  [birth year]  DATE OF ADMISSION:  07/04/2015 ADMITTING PHYSICIAN: Alford Highland, MD  DATE OF DISCHARGE: 07/07/2015  6:12 PM  PRIMARY CARE PHYSICIAN: Leanna Sato, MD    ADMISSION DIAGNOSIS:  General weakness [R53.1] Symptomatic anemia [D64.9]   DISCHARGE DIAGNOSIS:   Severe anemia erosive esophagitis Non-obstructing gastric ulcers  SECONDARY DIAGNOSIS:   Past Medical History  Diagnosis Date  . Hypertension   . Hypercholesteremia   . Thyroid disease   . Generalized pain   . Depression   . Hypothyroidism     HOSPITAL COURSE:   1. Severe anemia. s/p 2 units of packed red blood cells. Hb up to 8.2 and down to 7.5 today. No active bleeding. Follow-up hemoglobin as outpatient. EGD shows LA Grade D erosive esophagitis. Non-obstructing gastric ulcers with clean base. continue Protonix 40 mg by mouth twice a day per Dr. Paulla Fore. postpone colonoscopy as outpatient. Started iron. avoid NSAIDS.  2. Essential hypertension, continue lisinopril 3. Hyperlipidemia unspecified, continue Lipitor 4. Hypothyroidism unspecified continue levothyroxine 5. Anxiety depression continue Zoloft  * Dehydration, improved with normal saline IV.  DISCHARGE CONDITIONS:   Stable, discharged to home today.  CONSULTS OBTAINED:  Treatment Team:  Shaune Pollack, MD Christena Deem, MD  DRUG ALLERGIES:   Allergies  Allergen Reactions  . Ciprofloxacin Itching and Rash    DISCHARGE MEDICATIONS:   Discharge Medication List as of 07/07/2015  4:00 PM    START taking these medications   Details  ferrous sulfate 325 (65 FE) MG tablet Take 1 tablet (325 mg total) by mouth 3 (three) times daily with meals., Starting 07/07/2015, Until Discontinued, Print    pantoprazole (PROTONIX) 40 MG tablet Take 1 tablet (40 mg total) by mouth 2 (two) times daily before a meal.,  Starting 07/07/2015, Until Discontinued, Print      CONTINUE these medications which have NOT CHANGED   Details  atorvastatin (LIPITOR) 40 MG tablet Take 40 mg by mouth at bedtime. , Until Discontinued, Historical Med    cetirizine (ZYRTEC) 10 MG tablet Take 10 mg by mouth daily as needed for allergies., Until Discontinued, Historical Med    conjugated estrogens (PREMARIN) vaginal cream Place 1 Applicatorful vaginally every 3 (three) days., Until Discontinued, Historical Med    levothyroxine (SYNTHROID, LEVOTHROID) 25 MCG tablet Take 25 mcg by mouth daily before breakfast., Until Discontinued, Historical Med    lisinopril (PRINIVIL,ZESTRIL) 10 MG tablet Take 10 mg by mouth daily., Until Discontinued, Historical Med    sertraline (ZOLOFT) 100 MG tablet Take 100 mg by mouth at bedtime., Until Discontinued, Historical Med      STOP taking these medications     traMADol (ULTRAM) 50 MG tablet          DISCHARGE INSTRUCTIONS:    If you experience worsening of your admission symptoms, develop shortness of breath, life threatening emergency, suicidal or homicidal thoughts you must seek medical attention immediately by calling 911 or calling your MD immediately  if symptoms less severe.  You Must read complete instructions/literature along with all the possible adverse reactions/side effects for all the Medicines you take and that have been prescribed to you. Take any new Medicines after you have completely understood and accept all the possible adverse reactions/side effects.   Please note  You were cared for by a hospitalist during your hospital stay. If you have any questions  about your discharge medications or the care you received while you were in the hospital after you are discharged, you can call the unit and asked to speak with the hospitalist on call if the hospitalist that took care of you is not available. Once you are discharged, your primary care physician will handle any  further medical issues. Please note that NO REFILLS for any discharge medications will be authorized once you are discharged, as it is imperative that you return to your primary care physician (or establish a relationship with a primary care physician if you do not have one) for your aftercare needs so that they can reassess your need for medications and monitor your lab values.    Today   SUBJECTIVE   No complaint.   VITAL SIGNS:  Blood pressure 152/73, pulse 79, temperature 98.3 F (36.8 C), temperature source Oral, resp. rate 18, height  (1.626 m), weight 76.204 kg (168 lb), SpO2 97 %.  I/O:  No intake or output data in the 24 hours ending 07/08/15 1948  PHYSICAL EXAMINATION:  GENERAL:  75 y.o.-year-old patient lying in the bed with no acute distress.  EYES: Pupils equal, round, reactive to light and accommodation. No scleral icterus. Extraocular muscles intact.  HEENT: Head atraumatic, normocephalic. Oropharynx and nasopharynx clear.  NECK:  Supple, no jugular venous distention. No thyroid enlargement, no tenderness.  LUNGS: Normal breath sounds bilaterally, no wheezing, rales,rhonchi or crepitation. No use of accessory muscles of respiration.  CARDIOVASCULAR: S1, S2 normal. No murmurs, rubs, or gallops.  ABDOMEN: Soft, non-tender, non-distended. Bowel sounds present. No organomegaly or mass.  EXTREMITIES: No pedal edema, cyanosis, or clubbing.  NEUROLOGIC: Cranial nerves II through XII are intact. Muscle strength 5/5 in all extremities. Sensation intact. Gait not checked.  PSYCHIATRIC: The patient is alert and oriented x 3.  SKIN: No obvious rash, lesion, or ulcer.   DATA REVIEW:   CBC  Recent Labs Lab 07/07/15 0617  WBC 5.7  HGB 7.5*  HCT 25.0*  PLT 370    Chemistries   Recent Labs Lab 07/06/15 0606  NA 140  K 3.9  CL 110  CO2 23  GLUCOSE 96  BUN 14  CREATININE 0.92  CALCIUM 8.6*    Cardiac Enzymes  Recent Labs Lab 07/04/15 1417  TROPONINI  <0.03    Microbiology Results  No results found for this or any previous visit.  RADIOLOGY:  No results found.     I discussed with Dr. Marva Panda. Management plans discussed with the patient, family and they are in agreement.  CODE STATUS:  Code Status History    Date Active Date Inactive Code Status Order ID Comments User Context   07/04/2015  6:48 PM 07/07/2015  9:24 PM Full Code [id number]  Alford Highland, MD ED      TOTAL TIME TAKING CARE OF THIS PATIENT: 38 minutes.    Shaune Pollack M.D on 07/08/2015 at 7:48 PM  Between 7am to 6pm - Pager - [phone]  After 6pm go to [email or web address] - password [email or web address] [facility]  [place] [facility]  Office  [phone]  CC: Primary care physician; Leanna Sato, MD

## 2015-07-10 ENCOUNTER — Encounter: Payer: Self-pay | Admitting: Gastroenterology

## 2015-07-10 LAB — H PYLORI, IGM, IGG, IGA AB: H. Pylogi, Igm Abs: <9 units (ref 0.0–8.9)

## 2015-07-28 ENCOUNTER — Other Ambulatory Visit: Payer: Self-pay | Admitting: Gastroenterology

## 2015-07-28 DIAGNOSIS — R1310 Dysphagia, unspecified: Secondary | ICD-10-CM

## 2015-09-04 ENCOUNTER — Ambulatory Visit
Admission: RE | Admit: 2015-09-04 | Discharge: 2015-09-04 | Disposition: A | Discharge status: Home / Self Care | Payer: Medicare Other | Source: Ambulatory Visit | Attending: Gastroenterology | Admitting: Gastroenterology

## 2015-09-04 DIAGNOSIS — K219 Gastro-esophageal reflux disease without esophagitis: Secondary | ICD-10-CM | POA: Diagnosis not present

## 2015-09-04 DIAGNOSIS — R1310 Dysphagia, unspecified: Secondary | ICD-10-CM | POA: Insufficient documentation

## 2015-09-04 DIAGNOSIS — K449 Diaphragmatic hernia without obstruction or gangrene: Secondary | ICD-10-CM | POA: Diagnosis not present

## 2015-09-04 DIAGNOSIS — K224 Dyskinesia of esophagus: Secondary | ICD-10-CM | POA: Insufficient documentation

## 2015-09-10 ENCOUNTER — Emergency Department
Admission: EM | Admit: 2015-09-10 | Discharge: 2015-09-10 | Disposition: A | Discharge status: Home / Self Care | Payer: Medicare Other | Attending: Emergency Medicine | Admitting: Emergency Medicine

## 2015-09-10 ENCOUNTER — Encounter: Payer: Self-pay | Admitting: Emergency Medicine

## 2015-09-10 DIAGNOSIS — R300 Dysuria: Secondary | ICD-10-CM | POA: Diagnosis present

## 2015-09-10 DIAGNOSIS — Z79899 Other long term (current) drug therapy: Secondary | ICD-10-CM | POA: Diagnosis not present

## 2015-09-10 DIAGNOSIS — F329 Major depressive disorder, single episode, unspecified: Secondary | ICD-10-CM | POA: Diagnosis not present

## 2015-09-10 DIAGNOSIS — Z87891 Personal history of nicotine dependence: Secondary | ICD-10-CM | POA: Insufficient documentation

## 2015-09-10 DIAGNOSIS — N390 Urinary tract infection, site not specified: Secondary | ICD-10-CM | POA: Insufficient documentation

## 2015-09-10 DIAGNOSIS — I10 Essential (primary) hypertension: Secondary | ICD-10-CM | POA: Diagnosis not present

## 2015-09-10 DIAGNOSIS — E039 Hypothyroidism, unspecified: Secondary | ICD-10-CM | POA: Insufficient documentation

## 2015-09-10 HISTORY — DX: Urinary tract infection, site not specified: N39.0

## 2015-09-10 LAB — CBC
HCT: 39.2 % (ref 35.0–47.0)
Hemoglobin: 13.4 g/dL (ref 12.0–16.0)
MCH: 29.6 pg (ref 26.0–34.0)
MCHC: 34.2 g/dL (ref 32.0–36.0)
MCV: 86.6 fL (ref 80.0–100.0)
PLATELETS: 274 10*3/uL (ref 150–440)
RBC: 4.53 MIL/uL (ref 3.80–5.20)
RDW: 25 % — AB (ref 11.5–14.5)
WBC: 6.6 10*3/uL (ref 3.6–11.0)

## 2015-09-10 LAB — URINALYSIS COMPLETE WITH MICROSCOPIC (ARMC ONLY)
BILIRUBIN URINE: NEGATIVE
GLUCOSE, UA: NEGATIVE mg/dL
HGB URINE DIPSTICK: NEGATIVE
Ketones, ur: NEGATIVE mg/dL
NITRITE: NEGATIVE
Protein, ur: NEGATIVE mg/dL
Specific Gravity, Urine: 1.02 (ref 1.005–1.030)
pH: 5 (ref 5.0–8.0)

## 2015-09-10 LAB — BASIC METABOLIC PANEL
ANION GAP: 9 (ref 5–15)
BUN: 21 mg/dL — ABNORMAL HIGH (ref 6–20)
CALCIUM: 8.8 mg/dL — AB (ref 8.9–10.3)
CO2: 22 mmol/L (ref 22–32)
CREATININE: 1.06 mg/dL — AB (ref 0.44–1.00)
Chloride: 104 mmol/L (ref 101–111)
GFR, EST AFRICAN AMERICAN: 59 mL/min — AB (ref >60)
GFR, EST NON AFRICAN AMERICAN: 51 mL/min — AB (ref >60)
Glucose, Bld: 100 mg/dL — ABNORMAL HIGH (ref 65–99)
Potassium: 4 mmol/L (ref 3.5–5.1)
SODIUM: 135 mmol/L (ref 135–145)

## 2015-09-10 LAB — CHLAMYDIA/NGC RT PCR (ARMC ONLY)
Chlamydia Tr: NOT DETECTED
N gonorrhoeae: NOT DETECTED

## 2015-09-10 LAB — WET PREP, GENITAL
Clue Cells Wet Prep HPF POC: NONE SEEN
SPERM: NONE SEEN
TRICH WET PREP: NONE SEEN
YEAST WET PREP: NONE SEEN

## 2015-09-10 MED ORDER — FOSFOMYCIN TROMETHAMINE 3 G PO PACK
PACK | ORAL | Status: AC
Start: 2015-09-10 — End: 2015-09-10
  Administered 2015-09-10: 3 g via ORAL
  Filled 2015-09-10: qty 3

## 2015-09-10 MED ORDER — FLUCONAZOLE 50 MG PO TABS
50.0000 mg | ORAL_TABLET | Freq: Every day | ORAL | Status: DC
  Administered 2015-09-10: 50 mg via ORAL

## 2015-09-10 MED ORDER — FLUCONAZOLE 50 MG PO TABS
ORAL_TABLET | ORAL | Status: AC
  Administered 2015-09-10: 50 mg via ORAL
  Filled 2015-09-10: qty 1

## 2015-09-10 MED ORDER — PHENAZOPYRIDINE HCL 200 MG PO TABS
ORAL_TABLET | ORAL | Status: AC
Start: 2015-09-10 — End: 2015-09-10
  Administered 2015-09-10: 200 mg via ORAL
  Filled 2015-09-10: qty 1

## 2015-09-10 MED ORDER — PHENAZOPYRIDINE HCL 100 MG PO TABS: 100.0000 mg | ORAL_TABLET | Freq: Three times a day (TID) | ORAL | Status: DC | PRN

## 2015-09-10 MED ORDER — PHENAZOPYRIDINE HCL 200 MG PO TABS
200.0000 mg | ORAL_TABLET | Freq: Once | ORAL | Status: AC
  Administered 2015-09-10: 200 mg via ORAL

## 2015-09-10 MED ORDER — FLUCONAZOLE 50 MG PO TABS: 50.0000 mg | ORAL_TABLET | Freq: Two times a day (BID) | ORAL | Status: DC

## 2015-09-10 MED ORDER — FOSFOMYCIN TROMETHAMINE 3 G PO PACK
3.0000 g | PACK | Freq: Once | ORAL | Status: AC
  Administered 2015-09-10: 3 g via ORAL

## 2015-09-10 NOTE — Discharge Instructions (Signed)
You have been seen in the emergency department today for a urinary tract infection. Urinary tract infection appears to be due to a yeast, rather than a bacteria. You have been prescribed a one-time dose of an antibiotic in the emergency department, you have been prescribed Diflucan a yeast medication twice a day for the next 14 days. You'll also received a prescription for Pyridium the medication to help with your discomfort this will turn your urine a bright orange color. Please follow-up with urology as scheduled as soon as possible.    Urinary Tract Infection A urinary tract infection (UTI) can occur any place along the urinary tract. The tract includes the kidneys, ureters, bladder, and urethra. A type of germ called bacteria often causes a UTI. UTIs are often helped with antibiotic medicine.  HOME CARE   If given, take antibiotics as told by your doctor. Finish them even if you start to feel better.  Drink enough fluids to keep your pee (urine) clear or pale yellow.  Avoid tea, drinks with caffeine, and bubbly (carbonated) drinks.  Pee often. Avoid holding your pee in for a long time.  Pee before and after having sex (intercourse).  Wipe from front to back after you poop (bowel movement) if you are a woman. Use each tissue only once. GET HELP RIGHT AWAY IF:   You have back pain.  You have lower belly (abdominal) pain.  You have chills.  You feel sick to your stomach (nauseous).  You throw up (vomit).  Your burning or discomfort with peeing does not go away.  You have a fever.  Your symptoms are not better in 3 days. MAKE SURE YOU:   Understand these instructions.  Will watch your condition.  Will get help right away if you are not doing well or get worse.   This information is not intended to replace advice given to you by your health care provider. Make sure you discuss any questions you have with your health care provider.   Document Released: 11/20/2007 Document  Revised: 06/24/2014 Document Reviewed: 01/02/2012 Elsevier Interactive Patient Education [employer]2016 [employer]

## 2015-09-10 NOTE — ED Provider Notes (Signed)
[facility] Emergency Department Provider Note  Time seen: 9:18 PM  I have reviewed the triage vital signs and the nursing notes.   HISTORY  Chief Complaint Dysuria    HPI Elizabeth Shepard is a 75 y.o. female with a past medical history of hypertension, hyperlipidemia, UTIs, presents the emergency department with dysuria. According to the patient for the past 2 months she has been taking antibiotics every week to 2 weeks for dysuria. Patient has been seen by the walk-in clinic and diagnosed with recurrent urinary tract infections. Patient has an appointment with urology but is not until next week. Patient just finished a course of Suprax 3 days ago, soon as she finished she states the dysuria came back up following day. Now states 2 days of dysuria, she did not feel she could wait until next week so she came to the emergency department for evaluation. Denies fever, nausea, vomiting, diarrhea, abdominal pain. The patient states she has noticed a red rash in her groin/vaginal area which she wants evaluated as well. States a history of yeast infections with antibiotics she has been dosed Diflucan several times over the past 2 months. Patient also states she recently had a blood transfusion for iron deficient anemia and is concerned that her blood levels have dropped although denies any bleeding, shortness of breath weakness or pains.     Past Medical History  Diagnosis Date  . Hypertension   . Hypercholesteremia   . Thyroid disease   . Generalized pain   . Depression   . Hypothyroidism   . UTI (lower urinary tract infection)     Patient Active Problem List   Diagnosis Date Noted  . Anemia 07/04/2015    Past Surgical History  Procedure Laterality Date  . Augmentation mammaplasty    . Breast biopsy Right 1970  . Hip surgery    . Tonsillectomy    . Total hip arthroplasty Bilateral   . Mastoid surgery    . Esophagogastroduodenoscopy N/A 07/06/2015    Procedure:  ESOPHAGOGASTRODUODENOSCOPY (EGD);  Surgeon: Christena Deem, MD;  Location: [facility] ENDOSCOPY;  Service: Endoscopy;  Laterality: N/A;    Current Outpatient Rx  Name  Route  Sig  Dispense  Refill  . atorvastatin (LIPITOR) 40 MG tablet   Oral   Take 40 mg by mouth at bedtime.          . cetirizine (ZYRTEC) 10 MG tablet   Oral   Take 10 mg by mouth daily as needed for allergies.         Marland Kitchen conjugated estrogens (PREMARIN) vaginal cream   Vaginal   Place 1 Applicatorful vaginally every 3 (three) days.         . ferrous sulfate 325 (65 FE) MG tablet   Oral   Take 1 tablet (325 mg total) by mouth 3 (three) times daily with meals.   90 tablet   0   . levothyroxine (SYNTHROID, LEVOTHROID) 25 MCG tablet   Oral   Take 25 mcg by mouth daily before breakfast.         . lisinopril (PRINIVIL,ZESTRIL) 10 MG tablet   Oral   Take 10 mg by mouth daily.         . pantoprazole (PROTONIX) 40 MG tablet   Oral   Take 1 tablet (40 mg total) by mouth 2 (two) times daily before a meal.   60 tablet   0   . sertraline (ZOLOFT) 100 MG tablet   Oral  Take 100 mg by mouth at bedtime.           Allergies Ciprofloxacin  Family History  Problem Relation Age of Onset  . CAD Mother   . Diabetes Mother   . CAD Father   . Diabetes Father     Social History Social History  Substance Use Topics  . Smoking status: Former Games developermoker  . Smokeless tobacco: Never Used  . Alcohol Use: No    Review of Systems Constitutional: Negative for fever. Cardiovascular: Negative for chest pain. Respiratory: Negative for shortness of breath. Gastrointestinal: Negative for abdominal pain, vomiting and diarrhea. Genitourinary: Mild dysuria, burning sensation. Neurological: Negative for headache  10-point ROS otherwise negative.  ____________________________________________   PHYSICAL EXAM:  VITAL SIGNS: ED Triage Vitals  Enc Vitals Group     BP 09/10/15 2055 151/85 mmHg     Pulse Rate  09/10/15 2055 93     Resp 09/10/15 2055 20     Temp 09/10/15 2055 97.9 F (36.6 C)     Temp Source 09/10/15 2055 Oral     SpO2 09/10/15 2055 96 %     Weight 09/10/15 2055 166 lb (75.297 kg)     Height 09/10/15 2055 5\' 4"  (1.626 m)     Head Cir --      Peak Flow --      Pain Score 09/10/15 2055 8     Pain Loc --      Pain Edu? --      Excl. in GC? --     Constitutional: Alert and oriented. Well appearing and in no distress. Eyes: Normal exam ENT   Head: Normocephalic and atraumatic.   Mouth/Throat: Mucous membranes are moist. Cardiovascular: Normal rate, regular rhythm. No murmur Respiratory: Normal respiratory effort without tachypnea nor retractions. Breath sounds are clear Gastrointestinal: Soft and nontender. No distention. Musculoskeletal: Nontender with normal range of motion in all extremities. Neurologic:  Normal speech and language. No gross focal neurologic deficits Skin:  Skin is warm, dry and intact.  Psychiatric: Mood and affect are normal. Speech and behavior are normal.   ____________________________________________    INITIAL IMPRESSION / ASSESSMENT AND PLAN / ED COURSE  Pertinent labs & imaging results that were available during my care of the patient were reviewed by me and considered in my medical decision making (see chart for details).  Well-appearing female. We will check basic labs, urinalysis, GC/CH as well as wet prep.  Patient with mild erythema of the outer labia, no discharge noted. There are several satellite lesions most specific with Candida rash. Patient currently prescribed nystatin which she is using. We'll also dosed Diflucan. I've sent swabs, currently awaiting wet prep results.   Urinalysis has resulted showing butting knees ST as well as too numerous to count white blood cells, rare bacteria. White blood cell clumps are present. We will cover for urinary tract infection. There is rare bacteria seen a butting yeast is present.  Possibly suggesting a fungal infection as opposed to a bacterial infection. I sent a urine culture. I will likely cover with a short course of antibiotics and place the patient on Diflucan.  Patient's blood work within normal limits. Kidney function GFR 51. We'll dose the patient with a one-time dose of fosfomycin the emergency department I will place the patient on fluconazole 50 mg twice a day for the next 14 days. Patient will follow up with urology next week. Given the patient's recurrent urinary tract infections, not cleared by several rounds of  antibiotics I highly suspect that she has a symptomatic Candida urinary infection. ____________________________________________   FINAL CLINICAL IMPRESSION(S) / ED DIAGNOSES  UTI   Minna Antis, MD 09/10/15 2226

## 2015-09-10 NOTE — ED Notes (Signed)
Pt states recurrent uti. Pt states finished flagyl and cefuroxime on Friday. Pt states "as soon as i finished the antibiotic, i started burning again, it came right back." pt denies nausea, vomiting. Pt also complains of vaginal irritation.

## 2015-09-12 ENCOUNTER — Ambulatory Visit
Admission: RE | Admit: 2015-09-12 | Discharge: 2015-09-12 | Disposition: A | Discharge status: Home / Self Care | Payer: Medicare Other | Source: Ambulatory Visit | Attending: Gastroenterology | Admitting: Gastroenterology

## 2015-09-12 ENCOUNTER — Encounter: Payer: Self-pay | Admitting: *Deleted

## 2015-09-12 ENCOUNTER — Ambulatory Visit: Payer: Self-pay

## 2015-09-12 ENCOUNTER — Ambulatory Visit: Payer: Medicare Other | Admitting: Anesthesiology

## 2015-09-12 ENCOUNTER — Encounter
Admission: RE | Disposition: A | Discharge status: Home / Self Care | Payer: Self-pay | Source: Ambulatory Visit | Attending: Gastroenterology

## 2015-09-12 DIAGNOSIS — K2960 Other gastritis without bleeding: Secondary | ICD-10-CM | POA: Insufficient documentation

## 2015-09-12 DIAGNOSIS — R1310 Dysphagia, unspecified: Secondary | ICD-10-CM | POA: Diagnosis present

## 2015-09-12 DIAGNOSIS — Z87440 Personal history of urinary (tract) infections: Secondary | ICD-10-CM | POA: Insufficient documentation

## 2015-09-12 DIAGNOSIS — K5730 Diverticulosis of large intestine without perforation or abscess without bleeding: Secondary | ICD-10-CM | POA: Diagnosis not present

## 2015-09-12 DIAGNOSIS — Z881 Allergy status to other antibiotic agents status: Secondary | ICD-10-CM | POA: Insufficient documentation

## 2015-09-12 DIAGNOSIS — K319 Disease of stomach and duodenum, unspecified: Secondary | ICD-10-CM | POA: Insufficient documentation

## 2015-09-12 DIAGNOSIS — R609 Edema, unspecified: Secondary | ICD-10-CM | POA: Insufficient documentation

## 2015-09-12 DIAGNOSIS — Z79899 Other long term (current) drug therapy: Secondary | ICD-10-CM | POA: Insufficient documentation

## 2015-09-12 DIAGNOSIS — E7800 Pure hypercholesterolemia, unspecified: Secondary | ICD-10-CM | POA: Insufficient documentation

## 2015-09-12 DIAGNOSIS — K635 Polyp of colon: Secondary | ICD-10-CM | POA: Diagnosis not present

## 2015-09-12 DIAGNOSIS — K449 Diaphragmatic hernia without obstruction or gangrene: Secondary | ICD-10-CM | POA: Diagnosis not present

## 2015-09-12 DIAGNOSIS — Z8711 Personal history of peptic ulcer disease: Secondary | ICD-10-CM | POA: Diagnosis not present

## 2015-09-12 DIAGNOSIS — F329 Major depressive disorder, single episode, unspecified: Secondary | ICD-10-CM | POA: Insufficient documentation

## 2015-09-12 DIAGNOSIS — K259 Gastric ulcer, unspecified as acute or chronic, without hemorrhage or perforation: Secondary | ICD-10-CM | POA: Diagnosis present

## 2015-09-12 DIAGNOSIS — D649 Anemia, unspecified: Secondary | ICD-10-CM | POA: Insufficient documentation

## 2015-09-12 DIAGNOSIS — Z1211 Encounter for screening for malignant neoplasm of colon: Secondary | ICD-10-CM | POA: Insufficient documentation

## 2015-09-12 DIAGNOSIS — E039 Hypothyroidism, unspecified: Secondary | ICD-10-CM | POA: Insufficient documentation

## 2015-09-12 DIAGNOSIS — I10 Essential (primary) hypertension: Secondary | ICD-10-CM | POA: Insufficient documentation

## 2015-09-12 DIAGNOSIS — K2210 Ulcer of esophagus without bleeding: Secondary | ICD-10-CM | POA: Diagnosis not present

## 2015-09-12 HISTORY — PX: COLONOSCOPY WITH PROPOFOL: SHX5780

## 2015-09-12 HISTORY — PX: ESOPHAGOGASTRODUODENOSCOPY (EGD) WITH PROPOFOL: SHX5813

## 2015-09-12 LAB — CBC
HEMATOCRIT: 38.3 % (ref 35.0–47.0)
HEMOGLOBIN: 12.8 g/dL (ref 12.0–16.0)
MCH: 29.2 pg (ref 26.0–34.0)
MCHC: 33.4 g/dL (ref 32.0–36.0)
MCV: 87.5 fL (ref 80.0–100.0)
Platelets: 242 10*3/uL (ref 150–440)
RBC: 4.37 MIL/uL (ref 3.80–5.20)
RDW: 24.1 % — ABNORMAL HIGH (ref 11.5–14.5)
WBC: 5.1 10*3/uL (ref 3.6–11.0)

## 2015-09-12 LAB — URINE CULTURE

## 2015-09-12 SURGERY — COLONOSCOPY WITH PROPOFOL
Anesthesia: General

## 2015-09-12 MED ORDER — PHENYLEPHRINE HCL 10 MG/ML IJ SOLN
INTRAMUSCULAR | Status: DC | PRN
  Administered 2015-09-12: 100 ug via INTRAVENOUS

## 2015-09-12 MED ORDER — LIDOCAINE HCL (CARDIAC) 20 MG/ML IV SOLN
INTRAVENOUS | Status: DC | PRN
  Administered 2015-09-12: 100 mg via INTRAVENOUS

## 2015-09-12 MED ORDER — PROPOFOL 500 MG/50ML IV EMUL
INTRAVENOUS | Status: DC | PRN
  Administered 2015-09-12: 150 ug/kg/min via INTRAVENOUS

## 2015-09-12 MED ORDER — SODIUM CHLORIDE 0.9 % IV SOLN
INTRAVENOUS | Status: DC
  Administered 2015-09-12: 09:00:00 via INTRAVENOUS

## 2015-09-12 MED ORDER — FENTANYL CITRATE (PF) 100 MCG/2ML IJ SOLN
INTRAMUSCULAR | Status: DC | PRN
  Administered 2015-09-12: 50 ug via INTRAVENOUS

## 2015-09-12 MED ORDER — SODIUM CHLORIDE 0.9 % IV SOLN: INTRAVENOUS | Status: DC

## 2015-09-12 MED ORDER — GLYCOPYRROLATE 0.2 MG/ML IJ SOLN
INTRAMUSCULAR | Status: DC | PRN
  Administered 2015-09-12: 0.2 mg via INTRAVENOUS

## 2015-09-12 MED ORDER — METOPROLOL TARTRATE 1 MG/ML IV SOLN
INTRAVENOUS | Status: DC | PRN
  Administered 2015-09-12: 1.5 mg via INTRAVENOUS

## 2015-09-12 MED ORDER — PROPOFOL 10 MG/ML IV BOLUS
INTRAVENOUS | Status: DC | PRN
  Administered 2015-09-12: 100 mg via INTRAVENOUS

## 2015-09-12 NOTE — Anesthesia Postprocedure Evaluation (Signed)
Anesthesia Post Note  Patient: Dallie DadJoyce C Napp  Procedure(s) Performed: Procedure(s) (LRB): COLONOSCOPY WITH PROPOFOL (N/A) ESOPHAGOGASTRODUODENOSCOPY (EGD) WITH PROPOFOL (N/A)  Patient location during evaluation: Endoscopy Anesthesia Type: General Level of consciousness: awake and alert Pain management: pain level controlled Vital Signs Assessment: post-procedure vital signs reviewed and stable Respiratory status: spontaneous breathing, nonlabored ventilation, respiratory function stable and patient connected to nasal cannula oxygen Cardiovascular status: blood pressure returned to baseline and stable Postop Assessment: no signs of nausea or vomiting Anesthetic complications: no    Last Vitals:  Filed Vitals:   09/12/15 1110 09/12/15 1120  BP: 126/66 133/78  Pulse: 69 68  Temp:    Resp: 14 17    Last Pain: There were no vitals filed for this visit.               Cleda MccreedyJoseph K Piscitello

## 2015-09-12 NOTE — Transfer of Care (Signed)
Immediate Anesthesia Transfer of Care Note  Patient: Elizabeth Shepard  Procedure(s) Performed: Procedure(s): COLONOSCOPY WITH PROPOFOL (N/A) ESOPHAGOGASTRODUODENOSCOPY (EGD) WITH PROPOFOL (N/A)  Patient Location: PACU  Anesthesia Type:General  Level of Consciousness: awake, alert , oriented and patient cooperative  Airway & Oxygen Therapy: Patient Spontanous Breathing and Patient connected to nasal cannula oxygen  Post-op Assessment: Report given to RN and Post -op Vital signs reviewed and stable  Post vital signs: Reviewed and stable  Last Vitals:  Filed Vitals:   09/12/15 0845 09/12/15 1050  BP: 126/76 123/66  Pulse: 92 81  Temp: 36.6 C 36 C  Resp: 20 20    Complications: No apparent anesthesia complications

## 2015-09-12 NOTE — Op Note (Signed)
[facility] Gastroenterology Patient Name: Elizabeth Shepard Procedure Date: 09/12/2015 9:35 AM MRN: [id number] Account #: [id number] Date of Birth: 27-Feb-1941 Admit Type: Outpatient Age: 75 Room: [facility] ENDO ROOM 4 Gender: Female Note Status: Finalized Procedure:            Upper GI endoscopy Indications:          Follow-up of acute gastric ulcer Providers:            Christena Deem, MD Referring MD:         Dustin Folks, MD (Referring MD) Medicines:            Monitored Anesthesia Care Complications:        No immediate complications. Procedure:            Pre-Anesthesia Assessment:                       - ASA Grade Assessment: III - A patient with severe                        systemic disease.                       After obtaining informed consent, the endoscope was                        passed under direct vision. Throughout the procedure,                        the patient's blood pressure, pulse, and oxygen                        saturations were monitored continuously. The Endoscope                        was introduced through the mouth, and advanced to the                        third part of duodenum. The upper GI endoscopy was                        accomplished without difficulty. The patient tolerated                        the procedure well. Findings:      The distal esophagus was moderately tortuous.      The Z-line was regular.      LA Grade A (one or more mucosal breaks less than 5 mm, not extending       between tops of 2 mucosal folds) esophagitis with no bleeding was found.       Biopsies were taken with a cold forceps for histology.      A medium-sized sliding hiatal hernia was present.      Localized moderate inflammation characterized by congestion (edema),       erythema and linear erosions was found in the cardia consistant with       Cameron erosions. Biopsies were taken with a cold forceps for histology.      The previously noted  gastric ulcers are healed. Biopsies were taken with       a cold forceps for histology and Helicobacter pylori testing.  The examined duodenum was normal.      A medium-sized hiatal hernia with multiple Cameron ulcers was found. The       Z-line was a variable distance from incisors; the hiatal hernia was       sliding.      The cardia and gastric fundus were normal on retroflexion otherwise. Impression:           - Tortuous esophagus.                       - Z-line regular.                       - LA Grade A erosive esophagitis. Biopsied.                       - Medium-sized sliding hiatal hernia.                       - Erosive gastritis. Biopsied.                       - Normal examined duodenum. Recommendation:       - Use Protonix (pantoprazole) 40 mg PO daily daily.                       - Use sucralfate tablets 1 gram PO QID for 1 month.                       - Use sucralfate tablets 1 gram PO BID indefinitely.                       - Await pathology results.                       - Return to GI clinic in 4 weeks.                       - Check hemogram with white blood cell count and                        platelets today. Procedure Code(s):    --- Professional ---                       [phone], Esophagogastroduodenoscopy, flexible, transoral;                        with biopsy, single or multiple Diagnosis Code(s):    --- Professional ---                       Q39.9, Congenital malformation of esophagus, unspecified                       K20.8, Other esophagitis                       K44.9, Diaphragmatic hernia without obstruction or                        gangrene                       K29.60, Other gastritis without  bleeding                       K25.3, Acute gastric ulcer without hemorrhage or                        perforation CPT copyright 2016 American Medical Association. All rights reserved. The codes documented in this report are preliminary and upon coder review  may  be revised to meet current compliance requirements. Christena Deem, MD 09/12/2015 10:01:43 AM This report has been signed electronically. Number of Addenda: 0 Note Initiated On: 09/12/2015 9:35 AM      [facility]

## 2015-09-12 NOTE — Anesthesia Preprocedure Evaluation (Signed)
Anesthesia Evaluation  Patient identified by MRN, date of birth, ID band Patient awake    Reviewed: Allergy & Precautions, H&P , NPO status , Patient's Chart, lab work & pertinent test results  History of Anesthesia Complications Negative for: history of anesthetic complications  Airway Mallampati: III  TM Distance: >3 FB Neck ROM: limited    Dental  (+) Poor Dentition   Pulmonary neg pulmonary ROS, neg shortness of breath,    Pulmonary exam normal breath sounds clear to auscultation       Cardiovascular Exercise Tolerance: Good hypertension, (-) angina(-) Past MI and (-) DOE Normal cardiovascular exam Rhythm:regular Rate:Normal     Neuro/Psych PSYCHIATRIC DISORDERS Depression negative neurological ROS     GI/Hepatic negative GI ROS, Neg liver ROS,   Endo/Other  Hypothyroidism   Renal/GU negative Renal ROS  negative genitourinary   Musculoskeletal   Abdominal   Peds  Hematology negative hematology ROS (+)   Anesthesia Other Findings Past Medical History:   Hypertension                                                 Hypercholesteremia                                           Thyroid disease                                              Generalized pain                                             Depression                                                   Hypothyroidism                                               UTI (lower urinary tract infection)                         Past Surgical History:   AUGMENTATION MAMMAPLASTY                                      BREAST BIOPSY                                   Right 1970         HIP SURGERY  TONSILLECTOMY                                                 TOTAL HIP ARTHROPLASTY                          Bilateral              Mastoid surgery                                               ESOPHAGOGASTRODUODENOSCOPY                       N/A 07/06/2015      Comment:Procedure: ESOPHAGOGASTRODUODENOSCOPY (EGD);                Surgeon: Christena DeemMartin U Skulskie, MD;  Location: [facility]              ENDOSCOPY;  Service: Endoscopy;  Laterality:               N/A;  BMI    Body Mass Index   28.47 kg/m 2      Reproductive/Obstetrics negative OB ROS                             Anesthesia Physical Anesthesia Plan  ASA: III  Anesthesia Plan: General   Post-op Pain Management:    Induction:   Airway Management Planned:   Additional Equipment:   Intra-op Plan:   Post-operative Plan:   Informed Consent: I have reviewed the patients History and Physical, chart, labs and discussed the procedure including the risks, benefits and alternatives for the proposed anesthesia with the patient or authorized representative who has indicated his/her understanding and acceptance.   Dental Advisory Given  Plan Discussed with: Anesthesiologist, CRNA and Surgeon  Anesthesia Plan Comments:         Anesthesia Quick Evaluation

## 2015-09-12 NOTE — H&P (Signed)
Outpatient short stay form Pre-procedure 09/12/2015 9:32 AM Elizabeth DeemMartin U Skulskie MD  [facility]  Reason for visit:  EGD and colonoscopy  History of present illness:  Patient is a [age] year old female presenting today as above. She was admitted to [facility] during February 2017 at that time for shortness of breath. She was found to have marked anemia. On EGD she had 2 gastric ulcers on the lesser curvature of the stomach large this be being 10 mm. Had a number of erosions. She had been using NSAIDs regularly. She also had a large hiatal hernia. She tolerated her prep well. She no longer takes aspirin or NSAID products. He has been continued on Protonix 40 mg daily.    Current facility-administered medications:  .  0.9 %  sodium chloride infusion, , Intravenous, Continuous, Elizabeth DeemMartin U Skulskie, MD, Last Rate: 20 mL/hr at 09/12/15 0902 .  0.9 %  sodium chloride infusion, , Intravenous, Continuous, Elizabeth DeemMartin U Skulskie, MD  Prescriptions prior to admission  Medication Sig Dispense Refill Last Dose  . lisinopril (PRINIVIL,ZESTRIL) 10 MG tablet Take 10 mg by mouth daily.   09/11/2015 at 1500  . atorvastatin (LIPITOR) 40 MG tablet Take 40 mg by mouth at bedtime.    07/03/2015 at Unknown time  . cetirizine (ZYRTEC) 10 MG tablet Take 10 mg by mouth daily as needed for allergies.   Past Week at Unknown time  . conjugated estrogens (PREMARIN) vaginal cream Place 1 Applicatorful vaginally every 3 (three) days.   07/03/2015 at Unknown time  . ferrous sulfate 325 (65 FE) MG tablet Take 1 tablet (325 mg total) by mouth 3 (three) times daily with meals. 90 tablet 0   . fluconazole (DIFLUCAN) 50 MG tablet Take 1 tablet (50 mg total) by mouth 2 (two) times daily. 28 tablet 0   . levothyroxine (SYNTHROID, LEVOTHROID) 25 MCG tablet Take 25 mcg by mouth daily before breakfast.   07/04/2015 at Unknown time  . pantoprazole (PROTONIX) 40 MG tablet Take 1 tablet (40 mg total) by mouth 2  (two) times daily before a meal. 60 tablet 0   . phenazopyridine (PYRIDIUM) 100 MG tablet Take 1 tablet (100 mg total) by mouth 3 (three) times daily as needed for pain. 10 tablet 0   . sertraline (ZOLOFT) 100 MG tablet Take 100 mg by mouth at bedtime.   07/03/2015 at Unknown time     Allergies  Allergen Reactions  . Ciprofloxacin Itching and Rash     Past Medical History  Diagnosis Date  . Hypertension   . Hypercholesteremia   . Thyroid disease   . Generalized pain   . Depression   . Hypothyroidism   . UTI (lower urinary tract infection)     Review of systems:      Physical Exam    Heart and lungs: Regular rate and rhythm without rub or gallop, lungs are bilaterally clear    HEENT: Norm cephalic atraumatic eyes are anicteric    Other:     Pertinant exam for procedure: Soft nontender nondistended bowel sounds positive normoactive.    Planned proceedures: EGD and colonoscopy with indicated procedures. I have discussed the risks benefits and complications of procedures to include not limited to bleeding, infection, perforation and the risk of sedation and the patient wishes to proceed.    Elizabeth DeemMartin U Skulskie, MD Gastroenterology 09/12/2015  9:32 AM

## 2015-09-12 NOTE — Op Note (Signed)
[facility] Gastroenterology Patient Name: Elizabeth Shepard Procedure Date: 09/12/2015 9:34 AM MRN: [id number] Account #: [id number] Date of Birth: October 14, 1940 Admit Type: Outpatient Age: 75 Room: [facility] ENDO ROOM 4 Gender: Female Note Status: Finalized Procedure:            Colonoscopy Indications:          Screening for colorectal malignant neoplasm Providers:            Christena Deem, MD Referring MD:         Dustin Folks, MD (Referring MD) Medicines:            Monitored Anesthesia Care Complications:        No immediate complications. Procedure:            Pre-Anesthesia Assessment:                       - ASA Grade Assessment: III - A patient with severe                        systemic disease.                       After obtaining informed consent, the colonoscope was                        passed under direct vision. Throughout the procedure,                        the patient's blood pressure, pulse, and oxygen                        saturations were monitored continuously. The                        Colonoscope was introduced through the anus and                        advanced to the the cecum, identified by appendiceal                        orifice and ileocecal valve. The colonoscopy was                        unusually difficult due to restricted mobility of the                        colon and a tortuous colon. Successful completion of                        the procedure was aided by changing the patient to a                        supine position, changing the patient to a prone                        position, using manual pressure and changing                        endoscopes. The patient tolerated the procedure well.  The quality of the bowel preparation was fair. Findings:      A 2 mm polyp was found in the distal sigmoid colon. The polyp was       sessile. The polyp was removed with a cold biopsy forceps. Resection and       retrieval were complete.      Multiple small and large-mouthed diverticula were found in the sigmoid       colon, descending colon and transverse colon.      The retroflexed view of the distal rectum and anal verge was normal and       showed no anal or rectal abnormalities.      The digital rectal exam was normal. Impression:           - Preparation of the colon was fair.                       - One 2 mm polyp in the distal sigmoid colon, removed                        with a cold biopsy forceps. Resected and retrieved.                       - Diverticulosis in the sigmoid colon, in the                        descending colon and in the transverse colon.                       - The distal rectum and anal verge are normal on                        retroflexion view. Recommendation:       - Discharge patient to home. Procedure Code(s):    --- Professional ---                       45380, [id number], flexible; with biopsy, single or                        multiple Diagnosis Code(s):    --- Professional ---                       Z12.11, Encounter for screening for malignant neoplasm                        of colon                       D12.5, Benign neoplasm of sigmoid colon                       K57.30, Diverticulosis of large intestine without                        perforation or abscess without bleeding CPT copyright 2016 American Medical Association. All rights reserved. The codes documented in this report are preliminary and upon coder review may  be revised to meet current compliance requirements. Christena DeemMartin U Cynithia Hakimi, MD 09/12/2015 10:48:29 AM This report has been signed electronically. Number of Addenda: 0 Note Initiated On: 09/12/2015 9:34 AM Scope Withdrawal Time: 0 hours 3  minutes 40 seconds  Total Procedure Duration: 0 hours 41 minutes 38 seconds       [facility]

## 2015-09-13 ENCOUNTER — Encounter: Payer: Self-pay | Admitting: Gastroenterology

## 2015-09-13 LAB — SURGICAL PATHOLOGY

## 2015-09-20 ENCOUNTER — Ambulatory Visit (INDEPENDENT_AMBULATORY_CARE_PROVIDER_SITE_OTHER): Payer: Medicare Other | Admitting: Urology

## 2015-09-20 ENCOUNTER — Encounter: Payer: Self-pay | Admitting: Urology

## 2015-09-20 VITALS — BP 147/86 | HR 94 | Ht 64.0 in | Wt 159.0 lb

## 2015-09-20 DIAGNOSIS — N393 Stress incontinence (female) (male): Secondary | ICD-10-CM | POA: Diagnosis not present

## 2015-09-20 DIAGNOSIS — E785 Hyperlipidemia, unspecified: Secondary | ICD-10-CM | POA: Insufficient documentation

## 2015-09-20 DIAGNOSIS — N952 Postmenopausal atrophic vaginitis: Secondary | ICD-10-CM | POA: Diagnosis not present

## 2015-09-20 DIAGNOSIS — F329 Major depressive disorder, single episode, unspecified: Secondary | ICD-10-CM | POA: Insufficient documentation

## 2015-09-20 DIAGNOSIS — N390 Urinary tract infection, site not specified: Secondary | ICD-10-CM

## 2015-09-20 DIAGNOSIS — J309 Allergic rhinitis, unspecified: Secondary | ICD-10-CM | POA: Insufficient documentation

## 2015-09-20 DIAGNOSIS — F32A Depression, unspecified: Secondary | ICD-10-CM | POA: Insufficient documentation

## 2015-09-20 DIAGNOSIS — I10 Essential (primary) hypertension: Secondary | ICD-10-CM | POA: Insufficient documentation

## 2015-09-20 LAB — BLADDER SCAN AMB NON-IMAGING

## 2015-09-20 MED ORDER — SULFAMETHOXAZOLE-TRIMETHOPRIM 400-80 MG PO TABS: 1.0000 | ORAL_TABLET | Freq: Every day | ORAL | Status: DC

## 2015-09-20 NOTE — Progress Notes (Signed)
09/20/2015 5:21 PM   LATRICIA Shepard [birth year] [id number]  Referring provider: Leanna Sato, MD [address] [place], [place] [address]  Chief Complaint  Patient presents with  . Urinary Tract Infection    New Patient    HPI:  75 yo F with recurrent UTIs over the past ~1 year.  Reviewe d of culture data shows numerous E.coli and Klebsiella UTIs from [facility] in clinic.  No fevers with her infections.  Each infection, she reports typical symptoms of urinary urgency, frequency, lower abdominal pressure and dysuria.  Today, she feels like she may be developing a recurrent infection.  Most recently, she was seen in the ED on 09/10/15 and treated for yeast in the urine although UCx grew only mixed colonies.    She is using estrogen cream twice weekly x many years.  She had stopped it for a period of time just before she started developing recurrent UTIs but has since resumed this medication.  She uses vaginal applicator 1.5 g.    No issues with constipation or diahhrea.  She does wipe front to back.   No personal history of kidney stones.  No DM.    She does have some mild baseline SUI.    PVR 13  PMH: Past Medical History  Diagnosis Date  . Hypertension   . Hypercholesteremia   . Thyroid disease   . Generalized pain   . Depression   . Hypothyroidism   . UTI (lower urinary tract infection)     Surgical History: Past Surgical History  Procedure Laterality Date  . Augmentation mammaplasty    . Breast biopsy Right 1970  . Hip surgery    . Tonsillectomy    . Total hip arthroplasty Bilateral   . Mastoid surgery    . Esophagogastroduodenoscopy N/A 07/06/2015    Procedure: ESOPHAGOGASTRODUODENOSCOPY (EGD);  Surgeon: Christena Deem, MD;  Location: [facility] ENDOSCOPY;  Service: Endoscopy;  Laterality: N/A;  . Colonoscopy with propofol N/A 09/12/2015    Procedure: COLONOSCOPY WITH PROPOFOL;  Surgeon: Christena Deem, MD;  Location: [facility] ENDOSCOPY;  Service: Endoscopy;   Laterality: N/A;  . Esophagogastroduodenoscopy (egd) with propofol N/A 09/12/2015    Procedure: ESOPHAGOGASTRODUODENOSCOPY (EGD) WITH PROPOFOL;  Surgeon: Christena Deem, MD;  Location: [facility] ENDOSCOPY;  Service: Endoscopy;  Laterality: N/A;    Home Medications:    Medication List       This list is accurate as of: 09/20/15 11:59 PM.  Always use your most recent med list.               atorvastatin 40 MG tablet  Commonly known as:  LIPITOR  Take 40 mg by mouth at bedtime.     cetirizine 10 MG tablet  Commonly known as:  ZYRTEC  Take 10 mg by mouth daily as needed for allergies.     conjugated estrogens vaginal cream  Commonly known as:  PREMARIN  Place 1 Applicatorful vaginally every 3 (three) days.     ferrous sulfate 325 (65 FE) MG tablet  Take 1 tablet (325 mg total) by mouth 3 (three) times daily with meals.     fluconazole 50 MG tablet  Commonly known as:  DIFLUCAN  Take 1 tablet (50 mg total) by mouth 2 (two) times daily.     levothyroxine 25 MCG tablet  Commonly known as:  SYNTHROID, LEVOTHROID  Take 25 mcg by mouth daily before breakfast.     lisinopril 10 MG tablet  Commonly known as:  PRINIVIL,ZESTRIL  Take 10 mg by mouth daily.     pantoprazole 40 MG tablet  Commonly known as:  PROTONIX  Take 1 tablet (40 mg total) by mouth 2 (two) times daily before a meal.     phenazopyridine 100 MG tablet  Commonly known as:  PYRIDIUM  Take 1 tablet (100 mg total) by mouth 3 (three) times daily as needed for pain.     sertraline 100 MG tablet  Commonly known as:  ZOLOFT  Take 100 mg by mouth at bedtime.     sulfamethoxazole-trimethoprim 400-80 MG tablet  Commonly known as:  BACTRIM  Take 1 tablet by mouth daily.     traMADol 50 MG tablet  Commonly known as:  ULTRAM  TK 1 T PO UP TO Q 12 H PRN P        Allergies:  Allergies  Allergen Reactions  . Ciprofloxacin Itching and Rash    Family History: Family History  Problem Relation Age of Onset  . CAD  Mother   . Diabetes Mother   . CAD Father   . Diabetes Father   . Bladder Cancer Neg Hx   . Prostate cancer Neg Hx   . Kidney cancer Neg Hx     Social History:  reports that she has never smoked. She has never used smokeless tobacco. She reports that she does not drink alcohol or use illicit drugs.  ROS: UROLOGY Frequent Urination?: Yes Hard to postpone urination?: Yes Burning/pain with urination?: Yes Get up at night to urinate?: Yes Leakage of urine?: Yes Urine stream starts and stops?: Yes Trouble starting stream?: Yes Do you have to strain to urinate?: Yes Blood in urine?: No Urinary tract infection?: Yes Sexually transmitted disease?: No Injury to kidneys or bladder?: No Painful intercourse?: No Weak stream?: Yes Currently pregnant?: No Vaginal bleeding?: No Last menstrual period?: n  Gastrointestinal Nausea?: No Vomiting?: No Indigestion/heartburn?: No Diarrhea?: No Constipation?: No  Constitutional Fever: No Night sweats?: No Weight loss?: No Fatigue?: No  Skin Skin rash/lesions?: No Itching?: No  Eyes Blurred vision?: No Double vision?: No  Ears/Nose/Throat Sore throat?: No Sinus problems?: No  Hematologic/Lymphatic Swollen glands?: No Easy bruising?: No  Cardiovascular Leg swelling?: No Chest pain?: No  Respiratory Cough?: No Shortness of breath?: No  Endocrine Excessive thirst?: No  Musculoskeletal Back pain?: No Joint pain?: No  Neurological Headaches?: No Dizziness?: No  Psychologic Depression?: No Anxiety?: No  Physical Exam: BP 147/86 mmHg  Pulse 94  Ht  (1.626 m)  Wt 159 lb (72.122 kg)  BMI 27.28 kg/m2  Constitutional:  Alert and oriented, No acute distress. HEENT: [place] AT, moist mucus membranes.  Trachea midline, no masses. Cardiovascular: No clubbing, cyanosis, or edema. Respiratory: Normal respiratory effort, no increased work of breathing. GI: Abdomen is soft, nontender, nondistended, no abdominal  masses GU: No CVA tenderness. Normal urethral meatus.  Suprapubic tenderness. Pelvic exam: External and internal labia somewhat erythematous, irritated appearing. Atrophic vaginal mucosa. No significant cystocele or rectocele. Skin: No rashes, bruises or suspicious lesions. Lymph: No cervical or inguinal adenopathy. Neurologic: Grossly intact, no focal deficits, moving all 4 extremities. Psychiatric: Normal mood and affect.  Laboratory Data: Lab Results  Component Value Date   WBC 5.1 09/12/2015   HGB 12.8 09/12/2015   HCT 38.3 09/12/2015   MCV 87.5 09/12/2015   PLT 242 09/12/2015    Lab Results  Component Value Date   CREATININE 1.06* 09/10/2015    Urinalysis Results for orders placed or performed  in visit on 09/20/15  CULTURE, URINE COMPREHENSIVE  Result Value Ref Range   Urine Culture, Comprehensive Final report (A)    Result 1 Klebsiella pneumoniae (A)    ANTIMICROBIAL SUSCEPTIBILITY Comment   Microscopic Examination  Result Value Ref Range   WBC, UA >30 (A) 0 -  5 /hpf   RBC, UA 0-2 0 -  2 /hpf   Epithelial Cells (non renal) 0-10 0 - 10 /hpf   Bacteria, UA Few None seen/Few  Urinalysis, Complete  Result Value Ref Range   Specific Gravity, UA 1.010 1.005 - 1.030   pH, UA 5.5 5.0 - 7.5   Color, UA Yellow Yellow   Appearance Ur Cloudy (A) Clear   Leukocytes, UA 1+ (A) Negative   Protein, UA Negative Negative/Trace   Glucose, UA Negative Negative   Ketones, UA Negative Negative   RBC, UA Trace (A) Negative   Bilirubin, UA Negative Negative   Urobilinogen, Ur 0.2 0.2 - 1.0 mg/dL   Nitrite, UA Negative Negative   Microscopic Examination See below:   BLADDER SCAN AMB NON-IMAGING  Result Value Ref Range   Scan Result 13ml     Pertinent Imaging: n/a  Assessment & Plan:    1. Recurrent UTI Culture results reviewed. Recurrent Escherichia coli and Klebsiella infections treated appropriately.  UA, we discussed hygiene issues at length. We discussed the  importance of regular bowel movements.     Will obtain renal ultrasound to rule out any upper tract stones or abnormalities contributing to her recurrent symptoms.  Recommend that she resume topical estrogen cream 3 times a week, pea-sized amount of the urethra. A diagram was given.  In addition, recommend cranberry tabs twice daily.  Encouraged adequate oral hydration.  We also discussed antibiotic stewardship today.  We will treat if she has an infection today and recommend a short course of low-dose daily suppressive antibiotics, Bactrim x 30 days to try to break the cycle of infections.    - Urinalysis, Complete - BLADDER SCAN AMB NON-IMAGING - CULTURE, URINE COMPREHENSIVE - US Renal; Future  2. Atrophic vaginitis Above, resume topical estrogen cream.  3. Stress incontinence of urine Mild. Encouraged Kegel exercises.   Return in about 3 months (around 12/20/2015) for recheck UTI .  Vanna ScotlandAshley Chabely Norby, MD  [facility] [address], [address] [place], [place] [id number] [phone]  I spent 45 min with this patient of which greater than 50% was spent in counseling and coordination of care with the patient.

## 2015-09-21 LAB — URINALYSIS, COMPLETE
Bilirubin, UA: NEGATIVE
GLUCOSE, UA: NEGATIVE
KETONES UA: NEGATIVE
Nitrite, UA: NEGATIVE
PH UA: 5.5 (ref 5.0–7.5)
PROTEIN UA: NEGATIVE
Specific Gravity, UA: 1.01 (ref 1.005–1.030)
Urobilinogen, Ur: 0.2 mg/dL (ref 0.2–1.0)

## 2015-09-21 LAB — MICROSCOPIC EXAMINATION

## 2015-09-22 ENCOUNTER — Telehealth: Payer: Self-pay

## 2015-09-22 DIAGNOSIS — N390 Urinary tract infection, site not specified: Secondary | ICD-10-CM

## 2015-09-22 LAB — CULTURE, URINE COMPREHENSIVE

## 2015-09-22 MED ORDER — AMOXICILLIN-POT CLAVULANATE 875-125 MG PO TABS: 1.0000 | ORAL_TABLET | Freq: Two times a day (BID) | ORAL | Status: AC

## 2015-09-22 NOTE — Telephone Encounter (Signed)
-----   Message from Vanna ScotlandAshley Brandon, MD sent at 09/22/2015  4:49 PM EDT ----- I'd like to treat her with Augmentin bid x 7 days then go back to the bactrim ppx that I prescribed in the office for treatment of her UTI.    Vanna ScotlandAshley Brandon, MD

## 2015-09-22 NOTE — Telephone Encounter (Signed)
LMOM-medication sent to pharmacy 

## 2015-09-25 NOTE — Telephone Encounter (Signed)
Spoke with pt in reference to abx therapy. Reinforced how and when to take medications. Pt voiced understanding.

## 2015-09-26 ENCOUNTER — Ambulatory Visit
Admission: RE | Admit: 2015-09-26 | Discharge: 2015-09-26 | Disposition: A | Discharge status: Home / Self Care | Payer: Medicare Other | Source: Ambulatory Visit | Attending: Urology | Admitting: Urology

## 2015-09-26 DIAGNOSIS — N390 Urinary tract infection, site not specified: Secondary | ICD-10-CM | POA: Diagnosis not present

## 2015-10-02 ENCOUNTER — Telehealth: Payer: Self-pay | Admitting: Radiology

## 2015-10-02 NOTE — Telephone Encounter (Signed)
Spoke with patient concerning her RUS results.  She would like to have a copy of the report sent to her house.

## 2015-10-02 NOTE — Telephone Encounter (Signed)
Pt requests US results from 09/26/15. Pt asks for call back to [phone] today or tomorrow morning as she will not be available tomorrow afternoon.

## 2015-10-02 NOTE — Telephone Encounter (Signed)
Please advise 

## 2015-10-03 NOTE — Telephone Encounter (Signed)
RUS results mailed to pt's home address.

## 2015-10-04 ENCOUNTER — Other Ambulatory Visit: Payer: Self-pay | Admitting: Preventative Medicine

## 2015-10-04 DIAGNOSIS — Z1231 Encounter for screening mammogram for malignant neoplasm of breast: Secondary | ICD-10-CM

## 2015-10-12 ENCOUNTER — Telehealth: Payer: Self-pay

## 2015-10-12 NOTE — Telephone Encounter (Signed)
Spoke with pt in reference to RUS results. Pt voiced understanding. Pt voiced concern about having to take the cranberry tablets. Reinforced with pt the need of cranberry tablets in reference to UTIs. Pt voiced understanding.

## 2015-10-12 NOTE — Telephone Encounter (Signed)
-----   Message from Vanna ScotlandAshley Brandon, MD sent at 10/11/2015  9:13 AM EDT ----- Looks like this was already addressed but please ensure patient is aware that her renal ultrasound results were normal.  No stones/ masses or any other anatomic abnormality.  Vanna ScotlandAshley Brandon, MD

## 2015-10-18 ENCOUNTER — Ambulatory Visit
Admission: RE | Admit: 2015-10-18 | Discharge: 2015-10-18 | Disposition: A | Discharge status: Home / Self Care | Payer: Medicare Other | Source: Ambulatory Visit | Attending: Preventative Medicine | Admitting: Preventative Medicine

## 2015-10-18 DIAGNOSIS — Z1231 Encounter for screening mammogram for malignant neoplasm of breast: Secondary | ICD-10-CM | POA: Diagnosis not present

## 2015-12-20 ENCOUNTER — Ambulatory Visit: Payer: Medicare Other | Admitting: Urology

## 2015-12-27 ENCOUNTER — Ambulatory Visit: Payer: Medicare Other | Admitting: Urology

## 2015-12-28 ENCOUNTER — Emergency Department: Payer: Medicare Other

## 2015-12-28 ENCOUNTER — Inpatient Hospital Stay
Admission: EM | Admit: 2015-12-28 | Discharge: 2016-01-03 | DRG: 872 | Disposition: A | Discharge status: Skilled Nursing Facility | Payer: Medicare Other | Attending: Internal Medicine | Admitting: Internal Medicine

## 2015-12-28 ENCOUNTER — Encounter: Payer: Self-pay | Admitting: Urgent Care

## 2015-12-28 DIAGNOSIS — Z888 Allergy status to other drugs, medicaments and biological substances status: Secondary | ICD-10-CM

## 2015-12-28 DIAGNOSIS — I10 Essential (primary) hypertension: Secondary | ICD-10-CM | POA: Diagnosis present

## 2015-12-28 DIAGNOSIS — Y998 Other external cause status: Secondary | ICD-10-CM

## 2015-12-28 DIAGNOSIS — T148 Other injury of unspecified body region: Secondary | ICD-10-CM | POA: Diagnosis present

## 2015-12-28 DIAGNOSIS — Z96643 Presence of artificial hip joint, bilateral: Secondary | ICD-10-CM | POA: Diagnosis present

## 2015-12-28 DIAGNOSIS — R531 Weakness: Secondary | ICD-10-CM

## 2015-12-28 DIAGNOSIS — Y92238 Other place in hospital as the place of occurrence of the external cause: Secondary | ICD-10-CM | POA: Diagnosis present

## 2015-12-28 DIAGNOSIS — Y939 Activity, unspecified: Secondary | ICD-10-CM

## 2015-12-28 DIAGNOSIS — Z79899 Other long term (current) drug therapy: Secondary | ICD-10-CM

## 2015-12-28 DIAGNOSIS — R9431 Abnormal electrocardiogram [ECG] [EKG]: Secondary | ICD-10-CM | POA: Diagnosis present

## 2015-12-28 DIAGNOSIS — W19XXXA Unspecified fall, initial encounter: Secondary | ICD-10-CM

## 2015-12-28 DIAGNOSIS — E039 Hypothyroidism, unspecified: Secondary | ICD-10-CM | POA: Diagnosis present

## 2015-12-28 DIAGNOSIS — A419 Sepsis, unspecified organism: Secondary | ICD-10-CM | POA: Diagnosis present

## 2015-12-28 DIAGNOSIS — M79651 Pain in right thigh: Secondary | ICD-10-CM

## 2015-12-28 DIAGNOSIS — Z87440 Personal history of urinary (tract) infections: Secondary | ICD-10-CM

## 2015-12-28 DIAGNOSIS — N390 Urinary tract infection, site not specified: Secondary | ICD-10-CM | POA: Diagnosis present

## 2015-12-28 DIAGNOSIS — W1839XA Other fall on same level, initial encounter: Secondary | ICD-10-CM | POA: Diagnosis present

## 2015-12-28 DIAGNOSIS — M25551 Pain in right hip: Secondary | ICD-10-CM

## 2015-12-28 DIAGNOSIS — F419 Anxiety disorder, unspecified: Secondary | ICD-10-CM | POA: Diagnosis present

## 2015-12-28 DIAGNOSIS — R5383 Other fatigue: Secondary | ICD-10-CM

## 2015-12-28 DIAGNOSIS — B952 Enterococcus as the cause of diseases classified elsewhere: Secondary | ICD-10-CM | POA: Diagnosis present

## 2015-12-28 DIAGNOSIS — R55 Syncope and collapse: Secondary | ICD-10-CM | POA: Diagnosis present

## 2015-12-28 DIAGNOSIS — Z8249 Family history of ischemic heart disease and other diseases of the circulatory system: Secondary | ICD-10-CM

## 2015-12-28 DIAGNOSIS — E785 Hyperlipidemia, unspecified: Secondary | ICD-10-CM | POA: Diagnosis present

## 2015-12-28 DIAGNOSIS — F329 Major depressive disorder, single episode, unspecified: Secondary | ICD-10-CM | POA: Diagnosis present

## 2015-12-28 DIAGNOSIS — N3281 Overactive bladder: Secondary | ICD-10-CM | POA: Diagnosis present

## 2015-12-28 DIAGNOSIS — R52 Pain, unspecified: Secondary | ICD-10-CM

## 2015-12-28 LAB — CBC WITH DIFFERENTIAL/PLATELET
Basophils Absolute: 0 10*3/uL (ref 0–0.1)
Basophils Relative: 0 %
EOS ABS: 0 10*3/uL (ref 0–0.7)
EOS PCT: 0 %
HCT: 40.8 % (ref 35.0–47.0)
Hemoglobin: 14 g/dL (ref 12.0–16.0)
LYMPHS ABS: 0.5 10*3/uL — AB (ref 1.0–3.6)
LYMPHS PCT: 4 %
MCH: 32.7 pg (ref 26.0–34.0)
MCHC: 34.4 g/dL (ref 32.0–36.0)
MCV: 95.1 fL (ref 80.0–100.0)
MONO ABS: 0.4 10*3/uL (ref 0.2–0.9)
Monocytes Relative: 4 %
Neutro Abs: 10.4 10*3/uL — ABNORMAL HIGH (ref 1.4–6.5)
Neutrophils Relative %: 92 %
PLATELETS: 270 10*3/uL (ref 150–440)
RBC: 4.29 MIL/uL (ref 3.80–5.20)
RDW: 13.6 % (ref 11.5–14.5)
WBC: 11.3 10*3/uL — AB (ref 3.6–11.0)

## 2015-12-28 LAB — COMPREHENSIVE METABOLIC PANEL
ALBUMIN: 4.9 g/dL (ref 3.5–5.0)
ALT: 28 U/L (ref 14–54)
AST: 49 U/L — AB (ref 15–41)
Alkaline Phosphatase: 65 U/L (ref 38–126)
Anion gap: 13 (ref 5–15)
BILIRUBIN TOTAL: 1 mg/dL (ref 0.3–1.2)
BUN: 17 mg/dL (ref 6–20)
CHLORIDE: 100 mmol/L — AB (ref 101–111)
CO2: 23 mmol/L (ref 22–32)
CREATININE: 1.08 mg/dL — AB (ref 0.44–1.00)
Calcium: 9.5 mg/dL (ref 8.9–10.3)
GFR calc Af Amer: 58 mL/min — ABNORMAL LOW (ref >60)
GFR, EST NON AFRICAN AMERICAN: 50 mL/min — AB (ref >60)
GLUCOSE: 106 mg/dL — AB (ref 65–99)
POTASSIUM: 4.3 mmol/L (ref 3.5–5.1)
Sodium: 136 mmol/L (ref 135–145)
Total Protein: 8.3 g/dL — ABNORMAL HIGH (ref 6.5–8.1)

## 2015-12-28 LAB — TROPONIN I: Troponin I: <0.03 ng/mL (ref <0.03)

## 2015-12-28 LAB — URINALYSIS COMPLETE WITH MICROSCOPIC (ARMC ONLY)
BILIRUBIN URINE: NEGATIVE
Bacteria, UA: NONE SEEN
GLUCOSE, UA: NEGATIVE mg/dL
Nitrite: NEGATIVE
Protein, ur: NEGATIVE mg/dL
SQUAMOUS EPITHELIAL / LPF: NONE SEEN
Specific Gravity, Urine: 1.018 (ref 1.005–1.030)
pH: 5 (ref 5.0–8.0)

## 2015-12-28 LAB — GLUCOSE, CAPILLARY: GLUCOSE-CAPILLARY: 104 mg/dL — AB (ref 65–99)

## 2015-12-28 MED ORDER — ASPIRIN 81 MG PO CHEW
CHEWABLE_TABLET | ORAL | Status: AC
Start: 2015-12-28 — End: 2015-12-29
  Filled 2015-12-28: qty 4

## 2015-12-28 MED ORDER — DEXTROSE 5 % IV SOLN
1.0000 g | Freq: Once | INTRAVENOUS | Status: AC
  Administered 2015-12-28: 1 g via INTRAVENOUS

## 2015-12-28 MED ORDER — CEFTRIAXONE SODIUM 1 G IJ SOLR
1.0000 g | Freq: Once | INTRAMUSCULAR | Status: DC
  Filled 2015-12-28: qty 10

## 2015-12-28 MED ORDER — ASPIRIN 81 MG PO CHEW
324.0000 mg | CHEWABLE_TABLET | Freq: Once | ORAL | Status: AC
  Administered 2015-12-28: 324 mg via ORAL

## 2015-12-28 NOTE — ED Notes (Signed)
Watch and earrings placed in pt purse per pt request

## 2015-12-28 NOTE — ED Notes (Addendum)
See triage note. Pt states she took x2 tramadol for hip pain around 1500 today and thinks she's sedated. States she was here to see her husband who is admitted to hospital. Pt has significant difficulty transferring from wheelchair to bed and required 2 staff members to move her. + generalized weakness, equal bilaterally.

## 2015-12-28 NOTE — ED Notes (Signed)
Pt still unable to stand or walk = pt to be admitted. Ct of brain added on. Pt apparently drove her husband here today and was able to ambulate at that time.

## 2015-12-28 NOTE — ED Notes (Addendum)
Patient presents to the ED with reports of a fall. Patient unable to provide many details related to presenting HPI. Patient reports that she was "at the clinic" when she fell - states, "I dont think I need to be here, but they made me come". Patient reports that she was at the clinic with her husband who was the patient. Patient has no complaints of anything at this time.

## 2015-12-28 NOTE — ED Notes (Signed)
[facility] after being unable to urinate on bedpan. C/o nausea after transferring back to bed. MD notified, pt given emesis bag. Pt remains very weak, EKG performed. Results given to ED MD. RN accompanied pt to CT.

## 2015-12-28 NOTE — ED Provider Notes (Signed)
[facility] Emergency Department Provider Note  ____________________________________________  Time seen: Approximately 8:19 PM  I have reviewed the triage vital signs and the nursing notes.   HISTORY  Chief Complaint Fall    HPI ZENITH KERCHEVAL is a 75 y.o. female who apparently drove her husband to the hospital earlier today and had a fall while he was getting xrays.  She became lightheaded and fell and was escorted to the emergency for evaluation. She complains of right hip pain.No chest pain or shortness of breath. No abdominal pain. She doesn't think she was watched during the fall. Doesn't remember hitting her head. She is a poor historian and is slow to respond at times. She is very weak and you have believes she is able to drive herself home. She denies any shortness of breath or chest pains. No abdominal pain.   Past Medical History  Diagnosis Date  . Hypertension   . Hypercholesteremia   . Thyroid disease   . Generalized pain   . Depression   . Hypothyroidism   . UTI (lower urinary tract infection)     Patient Active Problem List   Diagnosis Date Noted  . Allergic rhinitis 09/20/2015  . Clinical depression 09/20/2015  . HLD (hyperlipidemia) 09/20/2015  . BP (high blood pressure) 09/20/2015  . Anemia 07/04/2015  . Acute hemorrhagic cystitis 11/07/2014    Past Surgical History  Procedure Laterality Date  . Hip surgery    . Tonsillectomy    . Total hip arthroplasty Bilateral   . Mastoid surgery    . Esophagogastroduodenoscopy N/A 07/06/2015    Procedure: ESOPHAGOGASTRODUODENOSCOPY (EGD);  Surgeon: Christena Deem, MD;  Location: [facility] ENDOSCOPY;  Service: Endoscopy;  Laterality: N/A;  . Colonoscopy with propofol N/A 09/12/2015    Procedure: COLONOSCOPY WITH PROPOFOL;  Surgeon: Christena Deem, MD;  Location: [facility] ENDOSCOPY;  Service: Endoscopy;  Laterality: N/A;  . Esophagogastroduodenoscopy (egd) with propofol N/A 09/12/2015    Procedure:  ESOPHAGOGASTRODUODENOSCOPY (EGD) WITH PROPOFOL;  Surgeon: Christena Deem, MD;  Location: [facility] ENDOSCOPY;  Service: Endoscopy;  Laterality: N/A;  . Augmentation mammaplasty Bilateral 1980  . Breast biopsy Right 1970    neg    Current Outpatient Rx  Name  Route  Sig  Dispense  Refill  . atorvastatin (LIPITOR) 40 MG tablet   Oral   Take 40 mg by mouth at bedtime.          . cetirizine (ZYRTEC) 10 MG tablet   Oral   Take 10 mg by mouth daily as needed for allergies.         Marland Kitchen conjugated estrogens (PREMARIN) vaginal cream   Vaginal   Place 1 Applicatorful vaginally every 3 (three) days.         . ferrous sulfate 325 (65 FE) MG tablet   Oral   Take 1 tablet (325 mg total) by mouth 3 (three) times daily with meals.   90 tablet   0   . fluconazole (DIFLUCAN) 50 MG tablet   Oral   Take 1 tablet (50 mg total) by mouth 2 (two) times daily.   28 tablet   0   . levothyroxine (SYNTHROID, LEVOTHROID) 25 MCG tablet   Oral   Take 25 mcg by mouth daily before breakfast.         . lisinopril (PRINIVIL,ZESTRIL) 10 MG tablet   Oral   Take 10 mg by mouth daily.         . pantoprazole (PROTONIX) 40 MG  tablet   Oral   Take 1 tablet (40 mg total) by mouth 2 (two) times daily before a meal.   60 tablet   0   . phenazopyridine (PYRIDIUM) 100 MG tablet   Oral   Take 1 tablet (100 mg total) by mouth 3 (three) times daily as needed for pain.   10 tablet   0   . sertraline (ZOLOFT) 100 MG tablet   Oral   Take 100 mg by mouth at bedtime.         . sulfamethoxazole-trimethoprim (BACTRIM) 400-80 MG tablet   Oral   Take 1 tablet by mouth daily.   30 tablet   0   . traMADol (ULTRAM) 50 MG tablet      TK 1 T PO UP TO Q 12 H PRN P      0     Allergies Ciprofloxacin  Family History  Problem Relation Age of Onset  . CAD Mother   . Diabetes Mother   . CAD Father   . Diabetes Father   . Bladder Cancer Neg Hx   . Prostate cancer Neg Hx   . Kidney cancer Neg Hx      Social History Social History  Substance Use Topics  . Smoking status: Never Smoker   . Smokeless tobacco: Never Used  . Alcohol Use: No    Review of Systems Constitutional: No fever/chills Eyes: No visual changes. ENT: No sore throat. Cardiovascular: Denies chest pain. Respiratory: Denies shortness of breath. Gastrointestinal: No abdominal pain.  No nausea, no vomiting.  No diarrhea.  No constipation. Genitourinary: Negative for dysuria. Musculoskeletal: per HPI Skin: Negative for rash. Neurological: Negative for headaches, focal weakness or numbness. 10-point ROS otherwise negative.  ____________________________________________   PHYSICAL EXAM:  VITAL SIGNS: ED Triage Vitals  Enc Vitals Group     BP 12/28/15 1914 143/74 mmHg     Pulse Rate 12/28/15 1914 95     Resp 12/28/15 1914 18     Temp 12/28/15 1914 99.9 F (37.7 C)     Temp Source 12/28/15 1914 Oral     SpO2 12/28/15 1914 95 %     Weight 12/28/15 1914 165 lb (74.844 kg)     Height 12/28/15 1914 5\' 4"  (1.626 m)     Head Cir --      Peak Flow --      Pain Score 12/28/15 1914 0     Pain Loc --      Pain Edu? --      Excl. in GC? --     Constitutional: Alert and oriented. Her responses are disjointed at times. Eyes: Conjunctivae are normal.  EOMI. Nose: No congestion/rhinnorhea. Mouth/Throat: Mucous membranes are moist.   Neck:  Supple.  No adenopathy.   Cardiovascular: Normal rate, regular rhythm. Grossly normal heart sounds.  Good peripheral circulation. Respiratory: Normal respiratory effort.  No retractions. Lungs CTAB. Gastrointestinal: Soft and nontender. No distention.  Musculoskeletal: Nml ROM of upper and lower extremity joints, except pain with range of motion of the right hip. Neurologic:  Normal speech and language, but her responses are sometimes delay. Skin:  Skin is warm, dry and intact. No rash noted. ____________________________________________   LABS (all labs ordered are listed,  but only abnormal results are displayed)  Labs Reviewed  GLUCOSE, CAPILLARY - Abnormal; Notable for the following:    Glucose-Capillary 104 (*)    All other components within normal limits  COMPREHENSIVE METABOLIC PANEL  CBC WITH DIFFERENTIAL/PLATELET  URINALYSIS  COMPLETEWITH MICROSCOPIC [facility] ONLY)   ____________________________________________  EKG   ____________________________________________  RADIOLOGY  CLINICAL DATA: Fall today. Right hip injury and pain. Initial encounter.  EXAM: DG HIP (WITH OR WITHOUT PELVIS) 2-3V RIGHT  COMPARISON: 08/03/2009  FINDINGS: Bipolar right hip prosthesis is stable in appearance. No evidence of acute fracture or dislocation. No pelvic fracture identified. Generalized osteopenia noted.  Bipolar left hip prosthesis is also seen, as well as severe lower lumbar spine degenerative changes.  IMPRESSION: Bipolar hip prosthesis. No evidence of acute fracture or dislocation.   Electronically Signed  By: Myles Rosenthal M.D.  On: 12/28/2015 21:20 ____________________________________________   PROCEDURES  Procedure(s) performed: None  Critical Care performed: No  ____________________________________________   INITIAL IMPRESSION / ASSESSMENT AND PLAN / ED COURSE  Pertinent labs & imaging results that were available during my care of the patient were reviewed by me and considered in my medical decision making (see chart for details).  75 year old female who presented to the emergency room after a fall while at the hospital with her husband. She complained of right hip pain, but also profound weakness. Hip x-rays were without acute findings. White blood count around 11,000. Chemistries were stable. Urinalysis suggested possible early UTI. Because of her profound weakness and consideration for admission, further evaluation was performed. EKG obtained suggesting possible acute MI. Information was conveyed to ER physician, Dr.  Derrill Kay who agreed to take over care of the patient. ____________________________________________   FINAL CLINICAL IMPRESSION(S) / ED DIAGNOSES  Final diagnoses:  None      Ignacia Bayley, PA-C 12/28/15 [id number]  Sharman Cheek, MD 12/29/15 [id number]

## 2015-12-28 NOTE — ED Provider Notes (Signed)
-----------------------------------------   11:54 PM on 12/28/2015 -----------------------------------------  Patient transferred to the main side of the emergency department from flex because of concerning EKG. Initial EKG is concerning for STEMI with elevation inferiorly and depressions laterally. The patient however denied any chest pain, shortness breath, arm or neck pain. No history of heart disease. Patient has risk factors of hypertension and hypercholesterolemia. A repeat EKG was done roughly 20 minutes later which appeared to show resolved findings for STEMI. However I did discuss with Dr. Melburn PopperNasher with cardiology who reviewed the two ekgs. Agreed that first ekg was concerning however given resolution would not consider it a Code STEMI at this time. Patient given ASA. Discussed this with hospitalists who were already planning on admitting patient.   Elizabeth SemenGraydon Patrese Neal, MD 12/28/15 [phone]

## 2015-12-28 NOTE — ED Notes (Addendum)
Pt arrived from flex with report from nurse stating she was having an acute MI ? STEMI - they also stated that the pt was unable to stand or assist with transfer - pt denies chest pain and pain in neck or arm - MD aware - Seth Bake RN and Lauren RN at bedside - 2nd EKG ran - RIT kit at bedside - pt c/o nausea - Dr Archie Balboa at bedside

## 2015-12-28 NOTE — ED Notes (Signed)
Pt unable to stand to get back onto the bed or follow simple directions. Is able to answer questions but when asked to stand up

## 2015-12-29 DIAGNOSIS — R531 Weakness: Secondary | ICD-10-CM

## 2015-12-29 DIAGNOSIS — R5383 Other fatigue: Secondary | ICD-10-CM

## 2015-12-29 DIAGNOSIS — R9431 Abnormal electrocardiogram [ECG] [EKG]: Secondary | ICD-10-CM | POA: Diagnosis present

## 2015-12-29 DIAGNOSIS — E039 Hypothyroidism, unspecified: Secondary | ICD-10-CM | POA: Diagnosis present

## 2015-12-29 DIAGNOSIS — N390 Urinary tract infection, site not specified: Secondary | ICD-10-CM | POA: Diagnosis present

## 2015-12-29 LAB — TSH: TSH: 4.115 u[IU]/mL (ref 0.350–4.500)

## 2015-12-29 LAB — CBC
HCT: 38.4 % (ref 35.0–47.0)
HEMOGLOBIN: 13.4 g/dL (ref 12.0–16.0)
MCH: 32.9 pg (ref 26.0–34.0)
MCHC: 34.8 g/dL (ref 32.0–36.0)
MCV: 94.7 fL (ref 80.0–100.0)
Platelets: 249 10*3/uL (ref 150–440)
RBC: 4.06 MIL/uL (ref 3.80–5.20)
RDW: 13.2 % (ref 11.5–14.5)
WBC: 13.7 10*3/uL — ABNORMAL HIGH (ref 3.6–11.0)

## 2015-12-29 LAB — TROPONIN I
TROPONIN I: 0.04 ng/mL — AB (ref <0.03)
Troponin I: 0.03 ng/mL (ref <0.03)
Troponin I: <0.03 ng/mL (ref <0.03)

## 2015-12-29 LAB — BASIC METABOLIC PANEL
Anion gap: 12 (ref 5–15)
BUN: 15 mg/dL (ref 6–20)
CALCIUM: 9.1 mg/dL (ref 8.9–10.3)
CO2: 20 mmol/L — AB (ref 22–32)
CREATININE: 0.91 mg/dL (ref 0.44–1.00)
Chloride: 102 mmol/L (ref 101–111)
GFR calc Af Amer: >60 mL/min (ref >60)
GLUCOSE: 147 mg/dL — AB (ref 65–99)
Potassium: 4.6 mmol/L (ref 3.5–5.1)
Sodium: 134 mmol/L — ABNORMAL LOW (ref 135–145)

## 2015-12-29 MED ORDER — ENOXAPARIN SODIUM 40 MG/0.4ML ~~LOC~~ SOLN
40.0000 mg | SUBCUTANEOUS | Status: DC
  Administered 2015-12-29 – 2016-01-02 (×5): 40 mg via SUBCUTANEOUS
  Filled 2015-12-29 (×5): qty 0.4

## 2015-12-29 MED ORDER — LISINOPRIL 10 MG PO TABS
10.0000 mg | ORAL_TABLET | Freq: Every day | ORAL | Status: DC
  Administered 2015-12-29: 10 mg via ORAL
  Filled 2015-12-29 (×2): qty 1

## 2015-12-29 MED ORDER — ONDANSETRON HCL 4 MG/2ML IJ SOLN
4.0000 mg | Freq: Four times a day (QID) | INTRAMUSCULAR | Status: DC | PRN
  Administered 2015-12-29: 4 mg via INTRAVENOUS
  Filled 2015-12-29: qty 2

## 2015-12-29 MED ORDER — NAPHAZOLINE-GLYCERIN 0.012-0.2 % OP SOLN
1.0000 [drp] | Freq: Four times a day (QID) | OPHTHALMIC | Status: DC | PRN
  Administered 2015-12-29 – 2015-12-31 (×2): 2 [drp] via OPHTHALMIC
  Filled 2015-12-29: qty 15

## 2015-12-29 MED ORDER — SODIUM CHLORIDE 0.9 % IV SOLN
INTRAVENOUS | Status: DC
  Administered 2015-12-29: 11:00:00 via INTRAVENOUS
  Administered 2015-12-30: 75 mL/h via INTRAVENOUS
  Administered 2015-12-30: 20:00:00 via INTRAVENOUS

## 2015-12-29 MED ORDER — ACETAMINOPHEN 325 MG PO TABS
650.0000 mg | ORAL_TABLET | Freq: Four times a day (QID) | ORAL | Status: DC | PRN
  Administered 2015-12-29 – 2015-12-31 (×8): 650 mg via ORAL
  Filled 2015-12-29 (×8): qty 2

## 2015-12-29 MED ORDER — SUCRALFATE 1 G PO TABS
1.0000 g | ORAL_TABLET | Freq: Two times a day (BID) | ORAL | Status: DC
  Administered 2015-12-29 – 2016-01-03 (×10): 1 g via ORAL
  Filled 2015-12-29 (×12): qty 1

## 2015-12-29 MED ORDER — SERTRALINE HCL 100 MG PO TABS
100.0000 mg | ORAL_TABLET | Freq: Every day | ORAL | Status: DC
  Administered 2015-12-29 – 2016-01-02 (×6): 100 mg via ORAL
  Filled 2015-12-29 (×6): qty 1

## 2015-12-29 MED ORDER — TRAMADOL HCL 50 MG PO TABS
50.0000 mg | ORAL_TABLET | Freq: Two times a day (BID) | ORAL | Status: DC | PRN
  Administered 2015-12-29: 50 mg via ORAL
  Filled 2015-12-29: qty 1

## 2015-12-29 MED ORDER — DEXTROSE 5 % IV SOLN
2.0000 g | INTRAVENOUS | Status: DC
  Administered 2015-12-29 – 2015-12-30 (×2): 2 g via INTRAVENOUS
  Filled 2015-12-29 (×3): qty 2

## 2015-12-29 MED ORDER — PANTOPRAZOLE SODIUM 40 MG PO TBEC
40.0000 mg | DELAYED_RELEASE_TABLET | Freq: Every day | ORAL | Status: DC
  Administered 2015-12-29 – 2016-01-03 (×6): 40 mg via ORAL
  Filled 2015-12-29 (×6): qty 1

## 2015-12-29 MED ORDER — SODIUM CHLORIDE 0.9% FLUSH
3.0000 mL | Freq: Two times a day (BID) | INTRAVENOUS | Status: DC
  Administered 2015-12-29 – 2016-01-03 (×10): 3 mL via INTRAVENOUS

## 2015-12-29 MED ORDER — HYPROMELLOSE (GONIOSCOPIC) 2.5 % OP SOLN: 1.0000 [drp] | OPHTHALMIC | Status: DC | PRN

## 2015-12-29 MED ORDER — SODIUM CHLORIDE 0.9 % IV BOLUS (SEPSIS)
1000.0000 mL | Freq: Once | INTRAVENOUS | Status: AC
  Administered 2015-12-29 (×2): 1000 mL via INTRAVENOUS

## 2015-12-29 MED ORDER — ATORVASTATIN CALCIUM 20 MG PO TABS
40.0000 mg | ORAL_TABLET | Freq: Every day | ORAL | Status: DC
  Administered 2015-12-29 – 2016-01-02 (×6): 40 mg via ORAL
  Filled 2015-12-29 (×6): qty 2

## 2015-12-29 MED ORDER — ONDANSETRON HCL 4 MG PO TABS
4.0000 mg | ORAL_TABLET | Freq: Four times a day (QID) | ORAL | Status: DC | PRN
  Administered 2015-12-29: 4 mg via ORAL
  Filled 2015-12-29: qty 1

## 2015-12-29 MED ORDER — ACETAMINOPHEN 650 MG RE SUPP: 650.0000 mg | Freq: Four times a day (QID) | RECTAL | Status: DC | PRN

## 2015-12-29 MED ORDER — POLYVINYL ALCOHOL 1.4 % OP SOLN
1.0000 [drp] | OPHTHALMIC | Status: DC | PRN
  Administered 2015-12-29 – 2015-12-31 (×3): 1 [drp] via OPHTHALMIC
  Filled 2015-12-29: qty 15

## 2015-12-29 MED ORDER — IBUPROFEN 400 MG PO TABS
400.0000 mg | ORAL_TABLET | Freq: Four times a day (QID) | ORAL | Status: DC | PRN
  Administered 2016-01-02 – 2016-01-03 (×2): 400 mg via ORAL
  Filled 2015-12-29 (×3): qty 1

## 2015-12-29 MED ORDER — LIDOCAINE 5 % EX PTCH
1.0000 | MEDICATED_PATCH | CUTANEOUS | Status: DC
  Administered 2015-12-29 – 2016-01-03 (×6): 1 via TRANSDERMAL
  Filled 2015-12-29 (×6): qty 1

## 2015-12-29 MED ORDER — LEVOTHYROXINE SODIUM 25 MCG PO TABS
25.0000 ug | ORAL_TABLET | Freq: Every day | ORAL | Status: DC
  Administered 2015-12-29 – 2016-01-03 (×5): 25 ug via ORAL
  Filled 2015-12-29 (×7): qty 1

## 2015-12-29 NOTE — [place] FL2 (Signed)
[place] MEDICAID FL2 LEVEL OF CARE SCREENING TOOL     IDENTIFICATION  Patient Name: Elizabeth Shepard Birthdate: 11-16-1940 Sex: female Admission Date (Current Location): 12/28/2015  [place]   Facility and Address:  [facility], [address], [place], [place] [address]      Provider Number: [id number]  Attending Physician Name and Address:  Enid Baasadhika Kalisetti, MD  Relative Name and Phone Number:       Current Level of Care: Hospital Recommended Level of Care: Skilled Nursing Facility Prior Approval Number:    Date Approved/Denied:   PASRR Number: [id number]  Discharge Plan: SNF    Current Diagnoses: Patient Active Problem List   Diagnosis Date Noted  . UTI (lower urinary tract infection) 12/29/2015  . Hypothyroidism 12/29/2015  . Weakness 12/29/2015  . Abnormal EKG 12/29/2015  . Allergic rhinitis 09/20/2015  . Clinical depression 09/20/2015  . HLD (hyperlipidemia) 09/20/2015  . BP (high blood pressure) 09/20/2015  . Anemia 07/04/2015  . Acute hemorrhagic cystitis 11/07/2014    Orientation RESPIRATION BLADDER Height & Weight     Self, Situation, Place, Time  Normal Continent Weight: 178 lb 1.6 oz (80.786 kg) Height:  5\' 4"  (162.6 cm)  BEHAVIORAL SYMPTOMS/MOOD NEUROLOGICAL BOWEL NUTRITION STATUS   (none)  (none) Continent Diet (cardiac)  AMBULATORY STATUS COMMUNICATION OF NEEDS Skin   Extensive Assist Verbally Normal                       Personal Care Assistance Level of Assistance  Bathing, Dressing Bathing Assistance: Limited assistance   Dressing Assistance: Limited assistance     Functional Limitations Info   (none)          SPECIAL CARE FACTORS FREQUENCY  PT (By licensed PT)                    Contractures Contractures Info: Not present    Additional Factors Info                  Current Medications (12/29/2015):  This is the current hospital active medication  list Current Facility-Administered Medications  Medication Dose Route Frequency Provider Last Rate Last Dose  . 0.9 %  sodium chloride infusion   Intravenous Continuous Enid Baasadhika Kalisetti, MD 75 mL/hr at 12/29/15 1050    . acetaminophen (TYLENOL) tablet 650 mg  650 mg Oral Q6H PRN Oralia Manisavid Willis, MD   650 mg at 12/29/15 1049   Or  . acetaminophen (TYLENOL) suppository 650 mg  650 mg Rectal Q6H PRN Oralia Manisavid Willis, MD      . atorvastatin (LIPITOR) tablet 40 mg  40 mg Oral QHS Oralia Manisavid Willis, MD   40 mg at 12/29/15 0144  . cefTRIAXone (ROCEPHIN) 2 g in dextrose 5 % 50 mL IVPB  2 g Intravenous Q24H Oralia Manisavid Willis, MD      . enoxaparin (LOVENOX) injection 40 mg  40 mg Subcutaneous Q24H Oralia Manisavid Willis, MD      . ibuprofen (ADVIL,MOTRIN) tablet 400 mg  400 mg Oral Q6H PRN Enid Baasadhika Kalisetti, MD      . levothyroxine (SYNTHROID, LEVOTHROID) tablet 25 mcg  25 mcg Oral QAC breakfast Oralia Manisavid Willis, MD   25 mcg at 12/29/15 0951  . lidocaine (LIDODERM) 5 % 1 patch  1 patch Transdermal Q24H Enid Baasadhika Kalisetti, MD   1 patch at 12/29/15 1050  . lisinopril (PRINIVIL,ZESTRIL) tablet 10 mg  10 mg Oral Daily Oralia Manisavid Willis, MD  10 mg at 12/29/15 0952  . naphazoline-glycerin (CLEAR EYES) ophth solution 1-2 drop  1-2 drop Both Eyes QID PRN Oralia Manis, MD   2 drop at 12/29/15 0355  . ondansetron (ZOFRAN) tablet 4 mg  4 mg Oral Q6H PRN Oralia Manis, MD   4 mg at 12/29/15 0552   Or  . ondansetron [facility]) injection 4 mg  4 mg Intravenous Q6H PRN Oralia Manis, MD      . pantoprazole (PROTONIX) EC tablet 40 mg  40 mg Oral Daily Oralia Manis, MD   40 mg at 12/29/15 0952  . polyvinyl alcohol (LIQUIFILM TEARS) 1.4 % ophthalmic solution 1 drop  1 drop Both Eyes PRN Oralia Manis, MD      . sertraline (ZOLOFT) tablet 100 mg  100 mg Oral QHS Oralia Manis, MD   100 mg at 12/29/15 0145  . sodium chloride flush (NS) 0.9 % injection 3 mL  3 mL Intravenous Q12H Oralia Manis, MD   3 mL at 12/29/15 0146  . sucralfate (CARAFATE) tablet 1 g  1 g Oral  BID Oralia Manis, MD   1 g at 12/29/15 4098     Discharge Medications: Please see discharge summary for a list of discharge medications.  Relevant Imaging Results:  Relevant Lab Results:   Additional Information ss: [id number]  York Spaniel, LCSW

## 2015-12-29 NOTE — Clinical Social Work Note (Signed)
Clinical Social Work Assessment  Patient Details  Name: Elizabeth Shepard MRN: [id number] Date of Birth: September 30, 1940  Date of referral:  12/29/15               Reason for consult:  Facility Placement                Permission sought to share information with:  Facility Medical sales representativeContact Representative, Family Supports Permission granted to share information::  Yes, Verbal Permission Granted  Name::        Agency::     Relationship::     Contact Information:     Housing/Transportation Living arrangements for the past 2 months:  Single Family Home Source of Information:  Patient, Spouse Patient Interpreter Needed:  None Criminal Activity/Legal Involvement Pertinent to Current Situation/Hospitalization:  No - Comment as needed Significant Relationships:  Spouse, Adult Children Lives with:  Self Do you feel safe going back to the place where you live?  Yes Need for family participation in patient care:   (possibly)  Care giving concerns:  Patient resides with her husband. Patient husband is currently also in the hospital.   Social Worker assessment / plan:  CSW spoke with patient this afternoon. She was slightly confused this morning according to documentation. Patient appeared alert and oriented this afternoon and answered CSW questions appropriately. CSW updated patient regarding her husband and the offer he had from [facility]. CSW spoke with patient regarding discharge planning and discussed that PT was pending. CSW asked if PT recommended rehab would she want to go where her husband was going. Patient replied that she would want to go to same facility if possible. CSW will complete FL2 and pasrr in the event she will require rehab.  Employment status:    Insurance informationAdvertising account executive:  Managed Medicare PT Recommendations:    Information / Referral to community resources:  Skilled Nursing Facility  Patient/Family's Response to care:  Patient expressed appreciation for CSW  assistance.  Patient/Family's Understanding of and Emotional Response to Diagnosis, Current Treatment, and Prognosis:  Patient not yet been assessed by PT and does not yet know her mobility abilities. Emotional Assessment Appearance:  Appears stated age Attitude/Demeanor/Rapport:   (pleasant and cooperative) Affect (typically observed):  Accepting, Adaptable, Calm Orientation:  Oriented to Self, Oriented to Place, Oriented to Situation Alcohol / Substance use:  Not Applicable Psych involvement (Current and /or in the community):  No (Comment)  Discharge Needs  Concerns to be addressed:  Care Coordination Readmission within the last 30 days:  No Current discharge risk:  None Barriers to Discharge:  No Barriers Identified   Elizabeth SpanielMonica Xzavien Harada, LCSW 12/29/2015, 12:55 PM

## 2015-12-29 NOTE — Progress Notes (Signed)
Patient manual BP 87/51, HR 87, 92% RA, vomited 250cc/hr.  MD notified. Zofran given and bolus started.

## 2015-12-29 NOTE — Progress Notes (Signed)
Critical lab value Lab notified this RN of a Troponin of 0.04 (pervious value of 7/13 @ 2338, was >0.03). MD notified. No new orders at this time.

## 2015-12-29 NOTE — Progress Notes (Signed)
PT Cancellation Note  Patient Details Name: Elizabeth Shepard MRN: [id number] DOB: [birth year]   Cancelled Treatment:    Reason Eval/Treat Not Completed: Medical issues which prohibited therapy;Other (comment) Consult received. Pt refused due to R hip pain. BP vitals were low as well. Will re-attempt at next date.    Thereasa ParkinShagun Loman Logan 12/29/2015, 2:41 PM  Thereasa ParkinShagun Mellina Benison, SPT [phone]

## 2015-12-29 NOTE — H&P (Signed)
[facility] [place] [facility]   PATIENT NAME: Elizabeth Shepard    MR#:  [id number]  DATE OF BIRTH:  1941/06/06  DATE OF ADMISSION:  12/28/2015  [facility]   REQUESTING/REFERRING PHYSICIAN: Derrill KayGoodman, MD  CHIEF COMPLAINT:   Chief Complaint  Patient presents with  . Fall    HISTORY OF PRESENT ILLNESS:  Elizabeth GipJoyce Mangrum  is a 75 y.o. female who presents with A complaint of weakness. Patient states that this weakness was sudden onset today. She relates that it occurred after she had taken some tramadol. However, she has taken this medicine before without such a profound reaction. Initial workup here in the ED is largely normal except for very slightly elevated white blood cell count, but with true bandemia on differential. No clear source for any potential infection, the patient does have a history of frequent UTIs and did have some white blood cells in her UA without nitrites or leukocyte esterase. Patient denies dysuria, but has had UTIs in the past without strong symptoms. Hospitals were called for admission for further evaluation.  PAST MEDICAL HISTORY:   Past Medical History  Diagnosis Date  . Hypertension   . Hypercholesteremia   . Thyroid disease   . Generalized pain   . Depression   . Hypothyroidism   . UTI (lower urinary tract infection)     PAST SURGICAL HISTORY:   Past Surgical History  Procedure Laterality Date  . Hip surgery    . Tonsillectomy    . Total hip arthroplasty Bilateral   . Mastoid surgery    . Esophagogastroduodenoscopy N/A 07/06/2015    Procedure: ESOPHAGOGASTRODUODENOSCOPY (EGD);  Surgeon: Christena DeemMartin U Skulskie, MD;  [facility];  Service: Endoscopy;  Laterality: N/A;  . Colonoscopy with propofol N/A 09/12/2015    Procedure: COLONOSCOPY WITH PROPOFOL;  Surgeon: Christena DeemMartin U Skulskie, MD;  [facility];  Service: Endoscopy;  Laterality: N/A;  . Esophagogastroduodenoscopy (egd)  with propofol N/A 09/12/2015    Procedure: ESOPHAGOGASTRODUODENOSCOPY (EGD) WITH PROPOFOL;  Surgeon: Christena DeemMartin U Skulskie, MD;  [facility];  Service: Endoscopy;  Laterality: N/A;  . Augmentation mammaplasty Bilateral 1980  . Breast biopsy Right 1970    neg    SOCIAL HISTORY:   Social History  Substance Use Topics  . Smoking status: Never Smoker   . Smokeless tobacco: Never Used  . Alcohol Use: No    FAMILY HISTORY:   Family History  Problem Relation Age of Onset  . CAD Mother   . Diabetes Mother   . CAD Father   . Diabetes Father   . Bladder Cancer Neg Hx   . Prostate cancer Neg Hx   . Kidney cancer Neg Hx     DRUG ALLERGIES:   Allergies  Allergen Reactions  . Ciprofloxacin Itching and Rash    MEDICATIONS AT HOME:   Prior to Admission medications   Medication Sig Start Date End Date Taking? Authorizing Provider  atorvastatin (LIPITOR) 40 MG tablet Take 40 mg by mouth at bedtime.    Yes Historical Provider, MD  cetirizine (ZYRTEC) 10 MG tablet Take 10 mg by mouth daily as needed for allergies.   Yes Historical Provider, MD  conjugated estrogens (PREMARIN) vaginal cream Place 1 Applicatorful vaginally every 3 (three) days.   Yes Historical Provider, MD  Cranberry 1000 MG CAPS Take 1,000 mg by mouth daily.   Yes Historical Provider, MD  levothyroxine (SYNTHROID, LEVOTHROID) 25 MCG tablet Take 25 mcg by  mouth daily before breakfast.   Yes Historical Provider, MD  lisinopril (PRINIVIL,ZESTRIL) 10 MG tablet Take 10 mg by mouth daily.   Yes Historical Provider, MD  Multiple Vitamin (MULTIVITAMIN WITH MINERALS) TABS tablet Take 1 tablet by mouth daily.   Yes Historical Provider, MD  pantoprazole (PROTONIX) 40 MG tablet Take 1 tablet (40 mg total) by mouth 2 (two) times daily before a meal. Patient taking differently: Take 40 mg by mouth daily.  07/07/15  Yes Shaune Pollack, MD  sertraline (ZOLOFT) 100 MG tablet Take 100 mg by mouth at bedtime.   Yes Historical Provider,  MD  sucralfate (CARAFATE) 1 g tablet Take 1 g by mouth 2 (two) times daily.   Yes Historical Provider, MD  traMADol (ULTRAM) 50 MG tablet Take 50 mg by mouth every 12 (twelve) hours as needed for moderate pain.   Yes Historical Provider, MD  ferrous sulfate 325 (65 FE) MG tablet Take 1 tablet (325 mg total) by mouth 3 (three) times daily with meals. Patient not taking: Reported on 12/28/2015 07/07/15   Shaune Pollack, MD  fluconazole (DIFLUCAN) 50 MG tablet Take 1 tablet (50 mg total) by mouth 2 (two) times daily. Patient not taking: Reported on 12/28/2015 09/10/15   Minna Antis, MD  phenazopyridine (PYRIDIUM) 100 MG tablet Take 1 tablet (100 mg total) by mouth 3 (three) times daily as needed for pain. Patient not taking: Reported on 12/28/2015 09/10/15   Minna Antis, MD  sulfamethoxazole-trimethoprim (BACTRIM) 400-80 MG tablet Take 1 tablet by mouth daily. Patient not taking: Reported on 12/28/2015 09/20/15   Vanna Scotland, MD    REVIEW OF SYSTEMS:  Review of Systems  Constitutional: Negative for fever, chills, weight loss and malaise/fatigue.  HENT: Negative for ear pain, hearing loss and tinnitus.   Eyes: Negative for blurred vision, double vision, pain and redness.  Respiratory: Negative for cough, hemoptysis and shortness of breath.   Cardiovascular: Negative for chest pain, palpitations, orthopnea and leg swelling.  Gastrointestinal: Negative for nausea, vomiting, abdominal pain, diarrhea and constipation.  Genitourinary: Negative for dysuria, frequency and hematuria.  Musculoskeletal: Negative for back pain, joint pain and neck pain.  Skin:       No acne, rash, or lesions  Neurological: Positive for weakness. Negative for dizziness, tremors and focal weakness.  Endo/Heme/Allergies: Negative for polydipsia. Does not bruise/bleed easily.  Psychiatric/Behavioral: Negative for depression. The patient is not nervous/anxious and does not have insomnia.      VITAL SIGNS:   Filed  Vitals:   12/28/15 1914  BP: 143/74  Pulse: 95  Temp: 99.9 F (37.7 C)  TempSrc: Oral  Resp: 18  Height: 5\' 4"  (1.626 m)  Weight: 74.844 kg (165 lb)  SpO2: 95%   Wt Readings from Last 3 Encounters:  12/28/15 74.844 kg (165 lb)  09/20/15 72.122 kg (159 lb)  09/12/15 75.297 kg (166 lb)    PHYSICAL EXAMINATION:  Physical Exam  Vitals reviewed. Constitutional: She is oriented to person, place, and time. She appears well-developed and well-nourished. No distress.  HENT:  Head: Normocephalic and atraumatic.  Mouth/Throat: Oropharynx is clear and moist.  Eyes: Conjunctivae and EOM are normal. Pupils are equal, round, and reactive to light. No scleral icterus.  Neck: Normal range of motion. Neck supple. No JVD present. No thyromegaly present.  Cardiovascular: Normal rate, regular rhythm and intact distal pulses.  Exam reveals no gallop and no friction rub.   No murmur heard. Respiratory: Effort normal and breath sounds normal. No respiratory distress. She  has no wheezes. She has no rales.  GI: Soft. Bowel sounds are normal. She exhibits no distension. There is no tenderness.  Musculoskeletal: Normal range of motion. She exhibits no edema.  No arthritis, no gout  Lymphadenopathy:    She has no cervical adenopathy.  Neurological: She is alert and oriented to person, place, and time. No cranial nerve deficit.  No dysarthria, no aphasia  Skin: Skin is warm and dry. No rash noted. No erythema.  Psychiatric: She has a normal mood and affect. Her behavior is normal. Judgment and thought content normal.    LABORATORY PANEL:   CBC  Recent Labs Lab 12/28/15 1951  WBC 11.3*  HGB 14.0  HCT 40.8  PLT 270   ------------------------------------------------------------------------------------------------------------------  Chemistries   Recent Labs Lab 12/28/15 1951  NA 136  K 4.3  CL 100*  CO2 23  GLUCOSE 106*  BUN 17  CREATININE 1.08*  CALCIUM 9.5  AST 49*  ALT 28   ALKPHOS 65  BILITOT 1.0   ------------------------------------------------------------------------------------------------------------------  Cardiac Enzymes  Recent Labs Lab 12/28/15 2338  TROPONINI <0.03   ------------------------------------------------------------------------------------------------------------------  RADIOLOGY:  Dg Chest 1 View  12/28/2015  CLINICAL DATA:  Weakness for several days. Fall from standing today. 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 Normal heart size and pulmonary vascularity. No focal airspace disease or consolidation in the lungs. No blunting of costophrenic angles. No pneumothorax. Calcified and tortuous aorta. Bilateral breast implants with calcification. Degenerative changes in the spine and shoulders. Thoracolumbar scoliosis. IMPRESSION: No active disease. Electronically Signed   By: Burman Nieves M.D.   On: 12/28/2015 23:35   Ct Head Wo Contrast  12/28/2015  CLINICAL DATA:  Altered mental status and weakness after a fall. EXAM: CT HEAD WITHOUT CONTRAST TECHNIQUE: Contiguous axial images were obtained from the base of the skull through the vertex without intravenous contrast. COMPARISON:  None. FINDINGS: Mild diffuse cerebral atrophy. Mild ventricular dilatation consistent with central atrophy. Low-attenuation changes in the periventricular white matter consistent small vessel ischemia. No mass effect or midline shift. No abnormal extra-axial fluid collections. Gray-white matter junctions are distinct. Basal cisterns are not effaced. No evidence of acute intracranial hemorrhage. No depressed skull fractures. Postoperative changes in the right mastoid air cells. Visualized paranasal sinuses and mastoid air cells are not opacified. Vascular calcifications. IMPRESSION: No acute intracranial abnormalities. Chronic atrophy and small vessel ischemic changes.  Postoperative changes in the right mastoid air cells. Electronically Signed   By: Burman Nieves M.D.   On: 12/28/2015 23:34   Dg Hip Unilat With Pelvis 2-3 Views Right  12/28/2015  CLINICAL DATA:  Fall today. Right hip injury and pain. Initial encounter. 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 No evidence of acute fracture or dislocation. No pelvic fracture identified. Generalized osteopenia noted. Bipolar left hip prosthesis is also seen, as well as severe lower lumbar spine degenerative changes. IMPRESSION: Bipolar hip prosthesis. No evidence of acute fracture or dislocation. Electronically Signed   By: Myles Rosenthal M.D.   On: 12/28/2015 21:20    EKG:   Orders placed or performed during the hospital encounter of 12/28/15  . ED EKG  . ED EKG  . EKG 12-Lead  . EKG 12-Lead  . EKG 12-Lead  . EKG 12-Lead    IMPRESSION AND PLAN:  Principal Problem:   UTI (lower urinary tract  infection) - urine cultures sent from the ED, IV Rocephin started in the ED, continue this on admission for now. Active Problems:   Weakness - unclear etiology at this time, the patient did have an EKG in the ED which initially looked like she might have ischemic changes, though repeat shortly afterwards had normalized and her troponin was initially negative. We will continue to trend her troponin tonight.   Abnormal EKG - initially some concern for possible ischemic changes with ST depressions in the lateral leads and a question of some potential ST elevation in her inferior leads, however repeat EKG performed very shortly after the initial EKG was normal, and the patient never experienced any anginal symptoms.   BP (high blood pressure) - continue home meds, currently stable   HLD (hyperlipidemia) - continue home meds   Hypothyroidism - home dose thyroid replacement  All the records are reviewed and case discussed with ED  provider. Management plans discussed with the patient and/or family.  DVT PROPHYLAXIS: SubQ lovenox  GI PROPHYLAXIS: PPI  ADMISSION STATUS: Inpatient  CODE STATUS: Full Code Status History    Date Active Date Inactive Code Status Order ID Comments User Context   07/04/2015  6:48 PM 07/07/2015  9:24 PM Full Code [id number]  Alford Highland, MD ED      TOTAL TIME TAKING CARE OF THIS PATIENT: 45 minutes.    Chevonne Bostrom FIELDING 12/29/2015, 12:11 AM  [facility]  Office  [phone]  CC: Primary care physician; [facility]

## 2015-12-29 NOTE — Progress Notes (Addendum)
CNA notified this RN that patient was digging in her purse and found two zip lock bags of meds.  In the first bag found 4 ibuprofen 200mg , 4 tramadol 50mg . In bag 2 found 1 Zoloft, 1 multivitamin, and ibuprofen 800mg .  According to patient and son this am, patient hasn't taken ibuprofen since she was found to have ulcers years ago.  Patient states she hasn't taken any of these meds since she was admitted. Told patient that the MD picks what meds to continue from home med list and patient should only take the meds given to patient by RN.   Alcario DroughtErica, Consulting civil engineerCharge RN, took bag of meds to pharmacy.

## 2015-12-29 NOTE — Care Management Obs Status (Signed)
MEDICARE OBSERVATION STATUS NOTIFICATION   Patient Details  Name: Elizabeth Shepard MRN: [id number] Date of Birth: 02/18/1941   Medicare Observation Status Notification Given:  Yes    Adonis HugueninBerkhead, [place] Greeson L, RN 12/29/2015, 12:10 PM

## 2015-12-29 NOTE — Care Management Note (Signed)
Case Management Note  Patient Details  Name: Dallie DadJoyce C [place] MRN: [id number] Date of Birth: August 25, 1940  Subjective/Objective:  Spoke with patient and son [name] at the bedside. Patient is mildly sedated at this time. Son stated that she is normally independent at home and uses no DME. She takes care of her husband who is also admitted here at the hospital.  Sons stated that he thinks the patient took too much of her pain medications. Patient is being treated for UTI. Son [name] stated that normally she drives herself to appointments.   He stated that she ambulated more than she usually does when she was visiting her husband here and that may be why she took more than her usual dose of pain medications. Plan for now is home with self care, patient is not at her baseline.    Action/Plan: anticipated discharge plan is  Home with self care.    Expected Discharge Date:                  Expected Discharge Plan:  Home/Self Care  In-House Referral:     Discharge planning Services  CM Consult  Post Acute Care Choice:    Choice offered to:     DME Arranged:    DME Agency:     HH Arranged:    HH Agency:     Status of Service:  In process, will continue to follow  If discussed at Long Length of Stay Meetings, dates discussed:    Additional Comments:  Adonis HugueninBerkhead, Walterine Amodei L, RN 12/29/2015, 11:54 AM

## 2015-12-29 NOTE — Progress Notes (Signed)
Patient is sleepy, but arouses easily. IV fluids started this shift. Lidocaine patch to right hip. Gave one dose of tylenol, with relief and one dose of zofran. [facility] a strong [facility]. This pm, low BP, using bedpan instead. Urinating concentrated urine. LBM 7/13. Bed alarm on for safety. A&O, but forgetful. See NN regarding low BP and critical lab value. Tele in place.

## 2015-12-29 NOTE — Progress Notes (Signed)
Updated son on patient's status, patient was agreeable to given him information.

## 2015-12-29 NOTE — Progress Notes (Signed)
Pharmacy Antibiotic Note  Elizabeth Shepard is a 75 y.o. female admitted on 12/28/2015 with UTI.  Pharmacy has been consulted for ceftriaxone dosing.  Plan: Ceftriaxone 2 grams q 24 hours ordered.  Height: 5\' 4"  (162.6 cm) Weight: 165 lb (74.844 kg) IBW/kg (Calculated) : 54.7  Temp (24hrs), Avg:99.6 F (37.6 C), Min:99.3 F (37.4 C), Max:99.9 F (37.7 C)   Recent Labs Lab 12/28/15 1951  WBC 11.3*  CREATININE 1.08*    Estimated Creatinine Clearance: 45.9 mL/min (by C-G formula based on Cr of 1.08).    Allergies  Allergen Reactions  . Ciprofloxacin Itching and Rash    Antimicrobials this admission: ceftriaxone  >>    >>   Dose adjustments this admission:   Microbiology results: 7/14 BCx: pending 7/13 UCx: pending    7/13 UA: LE(tr) NO2(-) WBC 6-30  Thank you for allowing pharmacy to be a part of this patient's care.  Mikaeel Petrow S 12/29/2015 1:22 AM

## 2015-12-29 NOTE — Progress Notes (Signed)
[facility] - [place] at [facility]   PATIENT NAME: Elizabeth GipJoyce Shepard    MR#:  [id number]  DATE OF BIRTH:  1940-08-07  SUBJECTIVE:  CHIEF COMPLAINT:   Chief Complaint  Patient presents with  . Fall   -Patient is confused this morning, she took a dose of tramadol. She is more awake according to RN at the time of my exam. -She states that she has taken 2 tablets of her tramadol yesterday less than 3 hours apart for her hip pain. -Still complains of right hip pain.  REVIEW OF SYSTEMS:  Review of Systems  Constitutional: Positive for malaise/fatigue. Negative for fever and chills.  HENT: Negative for ear discharge, ear pain and nosebleeds.   Eyes: Negative for blurred vision and double vision.  Respiratory: Negative for cough, shortness of breath and wheezing.   Cardiovascular: Negative for chest pain, palpitations and leg swelling.  Gastrointestinal: Negative for nausea, vomiting, abdominal pain, diarrhea and constipation.  Genitourinary: Negative for dysuria.  Musculoskeletal: Positive for joint pain.       Right hip pain  Neurological: Positive for weakness. Negative for dizziness, sensory change, speech change, focal weakness, seizures and headaches.  Psychiatric/Behavioral: Negative for depression.       Some confusion noted    DRUG ALLERGIES:   Allergies  Allergen Reactions  . Ciprofloxacin Itching and Rash    VITALS:  Blood pressure 94/38, pulse 97, temperature 98.8 F (37.1 C), temperature source Oral, resp. rate 16, height 5\' 4"  (1.626 m), weight 80.786 kg (178 lb 1.6 oz), SpO2 89 %.  PHYSICAL EXAMINATION:  Physical Exam  GENERAL:  75 y.o.-year-old patient lying in the bed with no acute distress.  EYES: Pupils equal, round, reactive to light and accommodation. No scleral icterus. Extraocular muscles intact.  HEENT: Head atraumatic, normocephalic. Oropharynx and nasopharynx clear.  NECK:  Supple, no jugular venous distention. No thyroid  enlargement, no tenderness.  LUNGS: Normal breath sounds bilaterally, no wheezing, rales,rhonchi or crepitation. No use of accessory muscles of respiration.  CARDIOVASCULAR: S1, S2 normal. No murmurs, rubs, or gallops.  ABDOMEN: Soft, nontender, nondistended. Bowel sounds present. No organomegaly or mass.  EXTREMITIES: No pedal edema, cyanosis, or clubbing.  NEUROLOGIC: Cranial nerves II through XII are intact. Muscle strength 5/5 in all extremities Except right leg, movement is limited due to right hip pain.. Sensation intact. Gait not checked.  PSYCHIATRIC: The patient is alert and oriented x 2-3. Repeating herself occasionally SKIN: No obvious rash, lesion, or ulcer.    LABORATORY PANEL:   CBC  Recent Labs Lab 12/29/15 0756  WBC 13.7*  HGB 13.4  HCT 38.4  PLT 249   ------------------------------------------------------------------------------------------------------------------  Chemistries   Recent Labs Lab 12/28/15 1951 12/29/15 0756  NA 136 134*  K 4.3 4.6  CL 100* 102  CO2 23 20*  GLUCOSE 106* 147*  BUN 17 15  CREATININE 1.08* 0.91  CALCIUM 9.5 9.1  AST 49*  --   ALT 28  --   ALKPHOS 65  --   BILITOT 1.0  --    ------------------------------------------------------------------------------------------------------------------  Cardiac Enzymes  Recent Labs Lab 12/29/15 0756  TROPONINI 0.04*   ------------------------------------------------------------------------------------------------------------------  RADIOLOGY:  Dg Chest 1 View  12/28/2015  CLINICAL DATA:  Weakness for several days. Fall from standing today. 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 Normal heart size and pulmonary vascularity. No focal airspace disease or consolidation in the lungs. No blunting of  costophrenic angles. No pneumothorax. Calcified and tortuous aorta. Bilateral breast implants  with calcification. Degenerative changes in the spine and shoulders. Thoracolumbar scoliosis. IMPRESSION: No active disease. Electronically Signed   By: Burman Nieves M.D.   On: 12/28/2015 23:35   Ct Head Wo Contrast  12/28/2015  CLINICAL DATA:  Altered mental status and weakness after a fall. EXAM: CT HEAD WITHOUT CONTRAST TECHNIQUE: Contiguous axial images were obtained from the base of the skull through the vertex without intravenous contrast. COMPARISON:  None. FINDINGS: Mild diffuse cerebral atrophy. Mild ventricular dilatation consistent with central atrophy. Low-attenuation changes in the periventricular white matter consistent small vessel ischemia. No mass effect or midline shift. No abnormal extra-axial fluid collections. Gray-white matter junctions are distinct. Basal cisterns are not effaced. No evidence of acute intracranial hemorrhage. No depressed skull fractures. Postoperative changes in the right mastoid air cells. Visualized paranasal sinuses and mastoid air cells are not opacified. Vascular calcifications. IMPRESSION: No acute intracranial abnormalities. Chronic atrophy and small vessel ischemic changes. Postoperative changes in the right mastoid air cells. Electronically Signed   By: Burman Nieves M.D.   On: 12/28/2015 23:34   Dg Hip Unilat With Pelvis 2-3 Views Right  12/28/2015  CLINICAL DATA:  Fall today. Right hip injury and pain. Initial encounter. 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 No evidence of acute fracture or dislocation. No pelvic fracture identified. Generalized osteopenia noted. Bipolar left hip prosthesis is also seen, as well as severe lower lumbar spine degenerative changes. IMPRESSION: Bipolar hip prosthesis. No evidence of acute fracture or dislocation. Electronically Signed   By: Myles Rosenthal M.D.   On: 12/28/2015 21:20    EKG:   Orders placed or performed  during the hospital encounter of 12/28/15  . ED EKG  . ED EKG  . EKG 12-Lead  . EKG 12-Lead  . EKG 12-Lead  . EKG 12-Lead    ASSESSMENT AND PLAN:   75 year old female with past medical history significant for hypertension, hypothyroidism, hyperlipidemia and bilateral hip replacement surgeries presents to the hospital after a confusion and near syncopal episode.  #1 near-syncope/confusion-secondary to tramadol. Patient has taken 2 tablets of  within 3 hours apart. --Similar confusion noted after one dose of tramadol this morning. Though she has been taking this medication for a longer time, it does not appear appropriate at this time to continue it. -Discontinue tramadol at this time. -CT of the head and hip x-rays are negative. -Agree with physical therapy. Gentle hydration with IV fluids.  #2 UTI-continue Rocephin at this time. Follow-up blood cultures due to elevated white count and also chills on presentation. -If cultures are negative, discontinue antibiotics as urine is not positive for any bacteria.  #3 abnormal EKG-on initial presentation. Repeat EKG was normal. -Patient denies any cardiac symptoms. Continue telemetry monitoring. -Troponin trending.  #4 right hip pain-likely muscular pain after the fall. Occasionally has hip pain from her surgery. X-rays negative for any acute findings. -Discontinue tramadol. Started on Lidoderm patch and Motrin when necessary for now. -Ice pack as needed and therapy  #5 DVT prophylaxis-on Lovenox  Physical therapy consult today. Anticipate discharge tomorrow if mental status clears.    All the records are reviewed and case discussed with Care Management/Social Workerr. Management plans discussed with the patient, family and they are in agreement.  CODE STATUS: Full code  TOTAL TIME TAKING CARE OF THIS PATIENT: 33 minutes.   POSSIBLE  D/C IN 1 DAYS, DEPENDING ON CLINICAL CONDITION.   Enid Baas M.D on 12/29/2015 at 12:20  PM  Between 7am to 6pm - Pager - [phone]  After 6pm go to [email or web address] - password [email or web address]  [place] [facility]  Office  [phone]  CC: Primary care physician; [facility]

## 2015-12-30 ENCOUNTER — Observation Stay: Payer: Medicare Other

## 2015-12-30 DIAGNOSIS — A419 Sepsis, unspecified organism: Secondary | ICD-10-CM | POA: Diagnosis present

## 2015-12-30 DIAGNOSIS — R55 Syncope and collapse: Secondary | ICD-10-CM | POA: Diagnosis present

## 2015-12-30 DIAGNOSIS — Z79899 Other long term (current) drug therapy: Secondary | ICD-10-CM | POA: Diagnosis not present

## 2015-12-30 DIAGNOSIS — B952 Enterococcus as the cause of diseases classified elsewhere: Secondary | ICD-10-CM | POA: Diagnosis present

## 2015-12-30 DIAGNOSIS — Z87440 Personal history of urinary (tract) infections: Secondary | ICD-10-CM | POA: Diagnosis not present

## 2015-12-30 DIAGNOSIS — R531 Weakness: Secondary | ICD-10-CM | POA: Diagnosis present

## 2015-12-30 DIAGNOSIS — E785 Hyperlipidemia, unspecified: Secondary | ICD-10-CM | POA: Diagnosis present

## 2015-12-30 DIAGNOSIS — Z96643 Presence of artificial hip joint, bilateral: Secondary | ICD-10-CM | POA: Diagnosis present

## 2015-12-30 DIAGNOSIS — E039 Hypothyroidism, unspecified: Secondary | ICD-10-CM | POA: Diagnosis present

## 2015-12-30 DIAGNOSIS — Y92238 Other place in hospital as the place of occurrence of the external cause: Secondary | ICD-10-CM | POA: Diagnosis present

## 2015-12-30 DIAGNOSIS — Y998 Other external cause status: Secondary | ICD-10-CM | POA: Diagnosis not present

## 2015-12-30 DIAGNOSIS — Y939 Activity, unspecified: Secondary | ICD-10-CM | POA: Diagnosis not present

## 2015-12-30 DIAGNOSIS — F419 Anxiety disorder, unspecified: Secondary | ICD-10-CM | POA: Diagnosis present

## 2015-12-30 DIAGNOSIS — Z888 Allergy status to other drugs, medicaments and biological substances status: Secondary | ICD-10-CM | POA: Diagnosis not present

## 2015-12-30 DIAGNOSIS — N390 Urinary tract infection, site not specified: Secondary | ICD-10-CM | POA: Diagnosis present

## 2015-12-30 DIAGNOSIS — E7800 Pure hypercholesterolemia, unspecified: Secondary | ICD-10-CM | POA: Diagnosis present

## 2015-12-30 DIAGNOSIS — M25551 Pain in right hip: Secondary | ICD-10-CM | POA: Diagnosis present

## 2015-12-30 DIAGNOSIS — F329 Major depressive disorder, single episode, unspecified: Secondary | ICD-10-CM | POA: Diagnosis present

## 2015-12-30 DIAGNOSIS — N3281 Overactive bladder: Secondary | ICD-10-CM | POA: Diagnosis present

## 2015-12-30 DIAGNOSIS — W1839XA Other fall on same level, initial encounter: Secondary | ICD-10-CM | POA: Diagnosis present

## 2015-12-30 DIAGNOSIS — I10 Essential (primary) hypertension: Secondary | ICD-10-CM | POA: Diagnosis present

## 2015-12-30 DIAGNOSIS — Z8249 Family history of ischemic heart disease and other diseases of the circulatory system: Secondary | ICD-10-CM | POA: Diagnosis not present

## 2015-12-30 LAB — BASIC METABOLIC PANEL
Anion gap: 9 (ref 5–15)
BUN: 13 mg/dL (ref 6–20)
CALCIUM: 8 mg/dL — AB (ref 8.9–10.3)
CO2: 21 mmol/L — AB (ref 22–32)
CREATININE: 0.81 mg/dL (ref 0.44–1.00)
Chloride: 104 mmol/L (ref 101–111)
GFR calc Af Amer: >60 mL/min (ref >60)
GFR calc non Af Amer: >60 mL/min (ref >60)
GLUCOSE: 112 mg/dL — AB (ref 65–99)
Potassium: 3.6 mmol/L (ref 3.5–5.1)
Sodium: 134 mmol/L — ABNORMAL LOW (ref 135–145)

## 2015-12-30 LAB — CBC
HCT: 34.2 % — ABNORMAL LOW (ref 35.0–47.0)
Hemoglobin: 11.9 g/dL — ABNORMAL LOW (ref 12.0–16.0)
MCH: 33.2 pg (ref 26.0–34.0)
MCHC: 35 g/dL (ref 32.0–36.0)
MCV: 94.8 fL (ref 80.0–100.0)
Platelets: 225 10*3/uL (ref 150–440)
RBC: 3.6 MIL/uL — AB (ref 3.80–5.20)
RDW: 13.8 % (ref 11.5–14.5)
WBC: 12.7 10*3/uL — AB (ref 3.6–11.0)

## 2015-12-30 LAB — LACTIC ACID, PLASMA
Lactic Acid, Venous: 2.5 mmol/L (ref 0.5–1.9)
Lactic Acid, Venous: 1.2 mmol/L (ref 0.5–1.9)

## 2015-12-30 MED ORDER — VANCOMYCIN HCL IN DEXTROSE 1-5 GM/200ML-% IV SOLN
1000.0000 mg | INTRAVENOUS | Status: DC
  Administered 2015-12-31: 1000 mg via INTRAVENOUS
  Filled 2015-12-30 (×2): qty 200

## 2015-12-30 MED ORDER — ESTROGENS, CONJUGATED 0.625 MG/GM VA CREA
1.0000 | TOPICAL_CREAM | Freq: Every day | VAGINAL | Status: DC
  Administered 2015-12-30 – 2016-01-03 (×3): 1 via VAGINAL
  Filled 2015-12-30 (×2): qty 30

## 2015-12-30 MED ORDER — LORAZEPAM 2 MG/ML IJ SOLN
1.0000 mg | Freq: Once | INTRAMUSCULAR | Status: AC
  Administered 2015-12-30: 1 mg via INTRAVENOUS
  Filled 2015-12-30: qty 1

## 2015-12-30 MED ORDER — VANCOMYCIN HCL IN DEXTROSE 1-5 GM/200ML-% IV SOLN
1000.0000 mg | INTRAVENOUS | Status: AC
  Administered 2015-12-30: 1000 mg via INTRAVENOUS
  Filled 2015-12-30: qty 200

## 2015-12-30 NOTE — Progress Notes (Signed)
   12/30/15 1600  Clinical Encounter Type  Visited With Patient;Family  Visit Type Initial  Referral From Nurse  Consult/Referral To Chaplain  Spoke with patient about HOPA and Living Will and left documents with her. I then as requested by son called him to explain the documents. He stated that today was not a good day to have them completed because mom appeared to be a little loopy. I agreed and we will try again tomorrow.   Fisher ScientificChaplain Sim [place] [phone] [id number]

## 2015-12-30 NOTE — Consult Note (Signed)
Patient is seen for evaluation of right hip pain. She does not report a recent fall to me. She has pain in the right hip and she points to the lateral aspect. And is a poor historian tonight and seems somewhat confused. She [place] She does not recall any prodromal symptoms of pain. She has a left total hip as well. She had a CT scan that shows ostial lysis in the superior acetabulum around one of the screws is to hold the acetabular component place and also what may be a fracture but appears to me to be an vascular foramen and the anterior proximal femur.  Physical examination: She has slight pain with logrolling and flexion with internal and external rotation of the hip which she points more lateral than anterior or to the groin. She is able flex extend the toes sensation intact around the hip  Clinical impression is patient with underlying ostial lysis but just prior nonacute problem but will need to be addressed at some point, suspect it's more of a bursitis  Recommendation: Physical therapy weightbearing as tolerated on the right, she will need to see [place] to have the ostial lysis addressed probably with revision surgery

## 2015-12-30 NOTE — Progress Notes (Signed)
Patient resting in bed, no complaints at this time. Son at bedside.

## 2015-12-30 NOTE — Progress Notes (Signed)
PT Cancellation Note  Patient Details Name: Elizabeth Shepard MRN: [id number] DOB: 1940/12/14   Cancelled Treatment:    Reason Eval/Treat Not Completed: Other (comment) (Chart review reveals pt continues to have pain management issues, and is now pending CT. Will follow remotely and attempt evauation at later date/time as appropriate. )   10:46 AM, 12/30/2015 Rosamaria LintsAllan C Buccola, PT, DPT Physical Therapist - [place] [phone](ASCOM)  [phone] (mobile)

## 2015-12-30 NOTE — Progress Notes (Signed)
Patient BP low, Dr Nemiah CommanderKalisetti notified of holding lisinopril this am. Patient complaining of right hip pain unrelieved by lidoderm patch. Dr Nemiah CommanderKalisetti will place order for CT scan to further evaluate.

## 2015-12-30 NOTE — Progress Notes (Signed)
Pharmacy Antibiotic Note  Elizabeth Shepard is a 75 y.o. female admitted on 12/28/2015 with UTI.  Pharmacy has been consulted for ceftriaxone dosing. Vancomycin added 7/15.  Plan: Continue ceftriaxone 2 grams q 24 hours.  Ke: 0.057 Vd: 45.6 L  t1/2: 12 h  Vancomycin 1000 mg iv q 18 hours with stacked dosing and a trough with the 5th total dose. Goal trough 10-15 mcg/ml.    Height: 5\' 4"  (162.6 cm) Weight: 178 lb 1.6 oz (80.786 kg) IBW/kg (Calculated) : 54.7  Temp (24hrs), Avg:99.3 F (37.4 C), Min:98.3 F (36.8 C), Max:100.2 F (37.9 C)   Recent Labs Lab 12/28/15 1951 12/29/15 0756 12/30/15 0532  WBC 11.3* 13.7* 12.7*  CREATININE 1.08* 0.91 0.81    Estimated Creatinine Clearance: 63.6 mL/min (by C-G formula based on Cr of 0.81).    Allergies  Allergen Reactions  . Ciprofloxacin Itching and Rash    Antimicrobials this admission: ceftriaxone  7/13 >>  Vancomycin 7/13 >>   Dose adjustments this admission:   Microbiology results: 7/14 BCx: NGTD 7/13 UCx: GPC  7/13 UA: LE(tr) NO2(-) WBC 6-30  Thank you for allowing pharmacy to be a part of this patient's care.  Luisa HartChristy, Luisenrique Conran D 12/30/2015 12:19 PM

## 2015-12-30 NOTE — Progress Notes (Signed)
Patient complaining of pain in right hip. I reeducated patient that the CT scan of the hip is supposed to be today, and that she has had tylenol already. She also has a lidoderm patch on the right hip. Patient asking about eye drops and premarin cream.

## 2015-12-30 NOTE — Progress Notes (Signed)
[facility] [place] [facility] is having some runs of nonsustained PVC's. Pt seen lying on bed comfortably watching TV, no complains of chest pain. BP 103/45. Will continue to monitor.

## 2015-12-30 NOTE — Progress Notes (Signed)
Patient's son would like to set up a power of attorney. Chaplain called and notified.

## 2015-12-30 NOTE — Progress Notes (Signed)
Pt requesting tylenol but not time, brought pt ibuprofen but pt refused stated she cannot take due to ulcer. Stated would wait for tylenol

## 2015-12-30 NOTE — Progress Notes (Signed)
[facility] - [place] at [facility]   PATIENT NAME: Elizabeth Shepard    MR#:  [id number]  DATE OF BIRTH:  10/08/1940  SUBJECTIVE:  CHIEF COMPLAINT:   Chief Complaint  Patient presents with  . Fall   -More alert today, but complains of chills and also low garde fever yesterday - Complains of significant right hip pain -Blood Pressure is improving today  REVIEW OF SYSTEMS:  Review of Systems  Constitutional: Positive for chills and malaise/fatigue. Negative for fever.  HENT: Negative for ear discharge, ear pain and nosebleeds.   Eyes: Negative for blurred vision and double vision.  Respiratory: Positive for shortness of breath. Negative for cough and wheezing.   Cardiovascular: Negative for chest pain, palpitations and leg swelling.  Gastrointestinal: Negative for nausea, vomiting, abdominal pain, diarrhea and constipation.  Genitourinary: Negative for dysuria.  Musculoskeletal: Positive for joint pain.       Right hip pain  Neurological: Positive for weakness. Negative for dizziness, sensory change, speech change, focal weakness, seizures and headaches.  Psychiatric/Behavioral: Negative for depression.       Some confusion noted    DRUG ALLERGIES:   Allergies  Allergen Reactions  . Ciprofloxacin Itching and Rash    VITALS:  Blood pressure 114/46, pulse 91, temperature 98.8 F (37.1 C), temperature source Oral, resp. rate 19, height 5\' 4"  (1.626 m), weight 80.786 kg (178 lb 1.6 oz), SpO2 90 %.  PHYSICAL EXAMINATION:  Physical Exam  GENERAL:  75 y.o.-year-old patient lying in the bed, Shaking with Rigors/chills. Fever recorded this morning EYES: Pupils equal, round, reactive to light and accommodation. No scleral icterus. Extraocular muscles intact.  HEENT: Head atraumatic, normocephalic. Oropharynx and nasopharynx clear.  NECK:  Supple, no jugular venous distention. No thyroid enlargement, no tenderness.  LUNGS: Normal breath sounds bilaterally, no  wheezing, rales,rhonchi or crepitation. No use of accessory muscles of respiration. Shallow breathing at times, decreased bibasilar breath sounds CARDIOVASCULAR: S1, S2 normal. No murmurs, rubs, or gallops.  ABDOMEN: Soft, nontender, nondistended. Bowel sounds present. No organomegaly or mass.  EXTREMITIES: No pedal edema, cyanosis, or clubbing.  NEUROLOGIC: Cranial nerves II through XII are intact. Muscle strength 5/5 in all extremities Except right leg, movement is limited due to right hip pain.. Sensation intact. Gait not checked.  PSYCHIATRIC: The patient is alert and oriented x 2-3. Repeating herself occasionally SKIN: No obvious rash, lesion, or ulcer.    LABORATORY PANEL:   CBC  Recent Labs Lab 12/30/15 0532  WBC 12.7*  HGB 11.9*  HCT 34.2*  PLT 225   ------------------------------------------------------------------------------------------------------------------  Chemistries   Recent Labs Lab 12/28/15 1951  12/30/15 0532  NA 136  < > 134*  K 4.3  < > 3.6  CL 100*  < > 104  CO2 23  < > 21*  GLUCOSE 106*  < > 112*  BUN 17  < > 13  CREATININE 1.08*  < > 0.81  CALCIUM 9.5  < > 8.0*  AST 49*  --   --   ALT 28  --   --   ALKPHOS 65  --   --   BILITOT 1.0  --   --   < > = values in this interval not displayed. ------------------------------------------------------------------------------------------------------------------  Cardiac Enzymes  Recent Labs Lab 12/29/15 1213  TROPONINI 0.03*   ------------------------------------------------------------------------------------------------------------------  RADIOLOGY:  Dg Chest 1 View  12/28/2015  CLINICAL DATA:  Weakness for several days. Fall from standing today. 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 Normal heart size and pulmonary vascularity. No focal airspace disease or consolidation in the lungs. No blunting of  costophrenic angles. No pneumothorax. Calcified and tortuous aorta. Bilateral breast implants with calcification. Degenerative changes in the spine and shoulders. Thoracolumbar scoliosis. IMPRESSION: No active disease. Electronically Signed   By: Burman Nieves M.D.   On: 12/28/2015 23:35   Ct Head Wo Contrast  12/28/2015  CLINICAL DATA:  Altered mental status and weakness after a fall. EXAM: CT HEAD WITHOUT CONTRAST TECHNIQUE: Contiguous axial images were obtained from the base of the skull through the vertex without intravenous contrast. COMPARISON:  None. FINDINGS: Mild diffuse cerebral atrophy. Mild ventricular dilatation consistent with central atrophy. Low-attenuation changes in the periventricular white matter consistent small vessel ischemia. No mass effect or midline shift. No abnormal extra-axial fluid collections. Gray-white matter junctions are distinct. Basal cisterns are not effaced. No evidence of acute intracranial hemorrhage. No depressed skull fractures. Postoperative changes in the right mastoid air cells. Visualized paranasal sinuses and mastoid air cells are not opacified. Vascular calcifications. IMPRESSION: No acute intracranial abnormalities. Chronic atrophy and small vessel ischemic changes. Postoperative changes in the right mastoid air cells. Electronically Signed   By: Burman Nieves M.D.   On: 12/28/2015 23:34   Ct Hip Right Wo Contrast  12/30/2015  CLINICAL DATA:  Status post fall. History of prior right hip arthroplasty. EXAM: CT OF THE RIGHT HIP WITHOUT CONTRAST TECHNIQUE: Multidetector CT imaging of the right hip was performed according to the standard protocol. Multiplanar CT image reconstructions were also generated. COMPARISON:  None. FINDINGS: Bones/Joint/Cartilage Right total hip arthroplasty with beam hardening artifact partially obscuring the adjacent soft tissue and osseous structures. 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 Subtle linear lucency (image 41/series 7) likely reflecting a vascular foramen versus less likely a nondisplaced fracture. No other fracture or dislocation.  Normal alignment. Muscles and Tendons Normal muscles.  No muscle atrophy. Soft tissue No fluid collection or hematoma.  No soft tissue mass. IMPRESSION: 1. 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 Subtle linear lucency (image 41/series 7) likely reflecting a vascular foramen versus less likely a nondisplaced fracture. 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 Electronically Signed   By: Elige Ko   On: 12/30/2015 12:32   Dg Hip Unilat With Pelvis 2-3 Views Right  12/28/2015  CLINICAL DATA:  Fall today. Right hip injury and pain. Initial encounter. 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 No evidence of acute fracture or dislocation. No pelvic fracture identified. Generalized osteopenia noted. Bipolar left hip prosthesis is also seen, as well as severe lower lumbar spine degenerative changes. IMPRESSION: Bipolar hip prosthesis. No evidence of acute fracture or dislocation. Electronically Signed   By: Myles Rosenthal M.D.   On: 12/28/2015 21:20    EKG:   Orders placed or performed during the hospital encounter of 12/28/15  . ED EKG  . ED EKG  . EKG 12-Lead  . EKG 12-Lead  . EKG 12-Lead  . EKG 12-Lead    ASSESSMENT AND PLAN:   75 year old female with past medical history significant for hypertension, hypothyroidism, hyperlipidemia and bilateral hip  replacement surgeries presents to the hospital after a confusion and near syncopal episode.  #1 near-syncope/confusion-secondary to  tramadol. And also from sepsis. -Tramadol has been discontinued -No Further syncopal episodes. -CT of the head is negative. -Agree with physical therapy. Gentle hydration with IV fluids.  #2 sepsis-secondary to likely urinary tract infection. -Blood cultures are negative. Low-grade temperatures still present. Urine cultures growing gram-positive cocci. -Added vancomycin for MRSA coverage. Due to clinical worsening. -Continue IV fluids. Monitor WBC which is improving. -continue Rocephin for gram-negative coverage for any other source. -Lactic acid ordered  #3 right hip pain-x-rays are negative. Initially thought to be musculoskeletal pain. -Due to persistent pain, CT of the hip ordered that showed occult nondisplaced fracture versus artifact. -We'll avoid narcotics due to her confusion. Continue ibuprofen and Tylenol. -Orthopedics consult for the same  #4 hypothyroidism-on Synthroid. TSH within normal limits.  #5 depression and anxiety-continue Zoloft. 1 dose of Ativan received.  #6 DVT prophylaxis-on Lovenox  Physical therapy consult today. Anticipate discharge tomorrow if mental status clears.    All the records are reviewed and case discussed with Care Management/Social Workerr. Management plans discussed with the patient, family and they are in agreement.  CODE STATUS: Full code  TOTAL TIME TAKING CARE OF THIS PATIENT: 37 minutes.   POSSIBLE D/C IN 1-2 DAYS, DEPENDING ON CLINICAL CONDITION.   Enid Baas M.D on 12/30/2015 at 12:44 PM  Between 7am to 6pm - Pager - [phone]  After 6pm go to [email or web address] - password [email or web address] [facility]  [place] [facility]  Office  [phone]  CC: Primary care physician; [facility] C

## 2015-12-30 NOTE — Progress Notes (Signed)
Dr Nemiah CommanderKalisetti notified of lactic acid 2.5.

## 2015-12-31 LAB — BASIC METABOLIC PANEL
Anion gap: 7 (ref 5–15)
BUN: 12 mg/dL (ref 6–20)
CHLORIDE: 105 mmol/L (ref 101–111)
CO2: 24 mmol/L (ref 22–32)
Calcium: 8 mg/dL — ABNORMAL LOW (ref 8.9–10.3)
Creatinine, Ser: 0.82 mg/dL (ref 0.44–1.00)
GFR calc Af Amer: >60 mL/min (ref >60)
GLUCOSE: 122 mg/dL — AB (ref 65–99)
POTASSIUM: 3.2 mmol/L — AB (ref 3.5–5.1)
Sodium: 136 mmol/L (ref 135–145)

## 2015-12-31 LAB — CBC
HEMATOCRIT: 32.1 % — AB (ref 35.0–47.0)
Hemoglobin: 11.3 g/dL — ABNORMAL LOW (ref 12.0–16.0)
MCH: 33.7 pg (ref 26.0–34.0)
MCHC: 35.3 g/dL (ref 32.0–36.0)
MCV: 95.6 fL (ref 80.0–100.0)
PLATELETS: 235 10*3/uL (ref 150–440)
RBC: 3.36 MIL/uL — AB (ref 3.80–5.20)
RDW: 13.7 % (ref 11.5–14.5)
WBC: 10.1 10*3/uL (ref 3.6–11.0)

## 2015-12-31 LAB — URINE CULTURE: Culture: >=100000 — AB

## 2015-12-31 MED ORDER — DOCUSATE SODIUM 100 MG PO CAPS
100.0000 mg | ORAL_CAPSULE | Freq: Two times a day (BID) | ORAL | Status: DC
  Administered 2015-12-31 – 2016-01-03 (×7): 100 mg via ORAL
  Filled 2015-12-31 (×7): qty 1

## 2015-12-31 MED ORDER — SODIUM CHLORIDE 0.9 % IV SOLN
1.0000 g | Freq: Four times a day (QID) | INTRAVENOUS | Status: DC
  Administered 2015-12-31 – 2016-01-02 (×7): 1 g via INTRAVENOUS
  Filled 2015-12-31 (×10): qty 1000

## 2015-12-31 MED ORDER — POLYETHYLENE GLYCOL 3350 17 G PO PACK
17.0000 g | PACK | Freq: Every day | ORAL | Status: DC | PRN
  Administered 2015-12-31 – 2016-01-01 (×2): 17 g via ORAL
  Filled 2015-12-31 (×2): qty 1

## 2015-12-31 MED ORDER — TRAMADOL-ACETAMINOPHEN 37.5-325 MG PO TABS
1.0000 | ORAL_TABLET | Freq: Three times a day (TID) | ORAL | Status: DC | PRN
  Administered 2015-12-31: 1 via ORAL
  Filled 2015-12-31: qty 1

## 2015-12-31 MED ORDER — SENNA 8.6 MG PO TABS
1.0000 | ORAL_TABLET | Freq: Every day | ORAL | Status: DC
  Administered 2015-12-31 – 2016-01-03 (×4): 8.6 mg via ORAL
  Filled 2015-12-31 (×5): qty 1

## 2015-12-31 MED ORDER — POTASSIUM CHLORIDE CRYS ER 20 MEQ PO TBCR
40.0000 meq | EXTENDED_RELEASE_TABLET | Freq: Once | ORAL | Status: AC
  Administered 2015-12-31: 40 meq via ORAL
  Filled 2015-12-31: qty 2

## 2015-12-31 NOTE — Progress Notes (Signed)
[facility] Physicians - [place] at [facility]   PATIENT NAME: Elizabeth Shepard    MR#:  [id number]  DATE OF BIRTH:  11-09-1940  SUBJECTIVE:  CHIEF COMPLAINT:   Chief Complaint  Patient presents with  . Fall   -More alert and back to baseline mental status today - still complains of right hip pain, Ortho note reviewed - had a temp of 101.76F today  REVIEW OF SYSTEMS:  Review of Systems  Constitutional: Positive for chills and malaise/fatigue. Negative for fever.  HENT: Negative for ear discharge, ear pain and nosebleeds.   Eyes: Negative for blurred vision and double vision.  Respiratory: Negative for cough, shortness of breath and wheezing.   Cardiovascular: Negative for chest pain, palpitations and leg swelling.  Gastrointestinal: Negative for nausea, vomiting, abdominal pain, diarrhea and constipation.  Genitourinary: Negative for dysuria.  Musculoskeletal: Positive for joint pain.       Right hip pain  Neurological: Positive for weakness. Negative for dizziness, sensory change, speech change, focal weakness, seizures and headaches.  Psychiatric/Behavioral: Negative for depression.       Some confusion noted    DRUG ALLERGIES:   Allergies  Allergen Reactions  . Ciprofloxacin Itching and Rash    VITALS:  Blood pressure 137/61, pulse 87, temperature 98.3 F (36.8 C), temperature source Oral, resp. rate 18, height  (1.626 m), weight 80.786 kg (178 lb 1.6 oz), SpO2 91 %.  PHYSICAL EXAMINATION:  Physical Exam  GENERAL:  75 y.o.-year-old patient lying in the bed, not in any acute distress. Mental status back to baseline. EYES: Pupils equal, round, reactive to light and accommodation. No scleral icterus. Extraocular muscles intact.  HEENT: Head atraumatic, normocephalic. Oropharynx and nasopharynx clear.  NECK:  Supple, no jugular venous distention. No thyroid enlargement, no tenderness.  LUNGS: Normal breath sounds bilaterally, no wheezing, rales,rhonchi or  crepitation. No use of accessory muscles of respiration.  decreased bibasilar breath sounds CARDIOVASCULAR: S1, S2 normal. No murmurs, rubs, or gallops.  ABDOMEN: Soft, nontender, nondistended. Bowel sounds present. No organomegaly or mass.  EXTREMITIES: No pedal edema, cyanosis, or clubbing.  NEUROLOGIC: Cranial nerves II through XII are intact. Muscle strength 5/5 in all extremities Except right leg, movement is limited due to right hip pain.. Sensation intact. Gait not checked.  PSYCHIATRIC: The patient is alert and oriented x 3.  SKIN: No obvious rash, lesion, or ulcer.    LABORATORY PANEL:   CBC  Recent Labs Lab 12/31/15 0427  WBC 10.1  HGB 11.3*  HCT 32.1*  PLT 235   ------------------------------------------------------------------------------------------------------------------  Chemistries   Recent Labs Lab 12/28/15 1951  12/31/15 0427  NA 136  < > 136  K 4.3  < > 3.2*  CL 100*  < > 105  CO2 23  < > 24  GLUCOSE 106*  < > 122*  BUN 17  < > 12  CREATININE 1.08*  < > 0.82  CALCIUM 9.5  < > 8.0*  AST 49*  --   --   ALT 28  --   --   ALKPHOS 65  --   --   BILITOT 1.0  --   --   < > = values in this interval not displayed. ------------------------------------------------------------------------------------------------------------------  Cardiac Enzymes  Recent Labs Lab 12/29/15 1213  TROPONINI 0.03*   ------------------------------------------------------------------------------------------------------------------  RADIOLOGY:  Ct Hip Right Wo Contrast  12/30/2015  CLINICAL DATA:  Status post fall. History of prior right hip arthroplasty. EXAM: CT OF THE RIGHT HIP WITHOUT CONTRAST  TECHNIQUE: Multidetector CT imaging of the right hip was performed according to the standard protocol. Multiplanar CT image reconstructions were also generated. COMPARISON:  None. 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 Subtle linear lucency (image 41/series 7) likely reflecting a vascular foramen versus less likely a nondisplaced fracture. No other fracture or dislocation.  Normal alignment. Muscles and Tendons Normal muscles.  No muscle atrophy. Soft tissue No fluid collection or hematoma.  No soft tissue mass. IMPRESSION: 1. 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 Subtle linear lucency (image 41/series 7) likely reflecting a vascular foramen versus less likely a nondisplaced fracture. 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 Electronically Signed   By: Elige KoHetal  Patel   On: 12/30/2015 12:32    EKG:   Orders placed or performed during the hospital encounter of 12/28/15  . ED EKG  . ED EKG  . EKG 12-Lead  . EKG 12-Lead  . EKG 12-Lead  . EKG 12-Lead    ASSESSMENT AND PLAN:   75 year old female with past medical history significant for hypertension, hypothyroidism, hyperlipidemia and bilateral hip replacement surgeries presents to the hospital after a confusion and near syncopal episode.  #1 Near-syncope/confusion-secondary to And also from sepsis. -No Further syncopal episodes. -CT of the head is negative. -physical therapy.   #2 sepsis-secondary to likely urinary tract infection. -Blood cultures are negative.  Urine cultures growing pan sensitive enterococcus - change ABX to ampicillin - clinically better, but still had a fever of 101.7F today. Monitor for now - consulted [facility] ID over the phone. If repeat fever spikes >100.2F, will draw repeat blood cultures -discontinue IV fluids today,WBC which  is improving.  #3 right hip pain-x-rays are negative. musculoskeletal pain and bursitis. -Due to persistent pain, CT of the hip ordered- discussed with orthopedics- no fracture noted, - start ultracet low dose, work with PT - heat pad, weight bearing as tolerated  -We'll avoid narcotics due to her confusion.   #4 hypothyroidism-on Synthroid. TSH within normal limits.  #5 depression and anxiety-continue Zoloft.  #6 DVT prophylaxis-on Lovenox  Physical therapy consulted.  Needs rehab.    All the records are reviewed and case discussed with Care Management/Social Workerr. Management plans discussed with the patient, family and they are in agreement.  CODE STATUS: Full code  TOTAL TIME TAKING CARE OF THIS PATIENT: 37 minutes.   POSSIBLE D/C IN 2 DAYS, DEPENDING ON CLINICAL CONDITION.   Enid BaasKALISETTI,Leopold Smyers M.D on 12/31/2015 at 12:47 PM  Between 7am to 6pm - Pager - [phone]  After 6pm go to [email or web address] - password [email or web address] [facility]  [facility]  Office  [phone]  CC: Primary care physician; [facility]

## 2015-12-31 NOTE — Progress Notes (Signed)
CSW presented bed offers to patient. Patient accepted bed offer at [facility]. CSW informed Jomarie LongsJoseph- Admissions Coordinator at [facility]. CSW will continue to follow and assist.  Woodroe Modehristina Maverik Foot, MSW, LCSW-A, LCAS-A Clinical Social Worker [phone]

## 2015-12-31 NOTE — Evaluation (Signed)
Physical Therapy Evaluation Patient Details Name: Elizabeth Shepard MRN: [id number] DOB: 29-Jan-1941 Today's Date: 12/31/2015   History of Present Illness  75 y/o female with recent onset weakness.  She has elevated WBC count and found to have UTI.  Pt also with some R hip pain (remote history of b/l hip replacements)  Clinical Impression  Pt is slow and limited with mobility and ambulation but despite her guardedness she shows good effort with PT.  She does not have excessive pain with ~10 minutes of supine exercises but is uncomfortable with this and moving in bed.  She fatigues significantly with limited and slow ambulation and agrees that she is not able/safe to return home at this time.  Pt will benefit from short term rehab to work back to PLOF.    Follow Up Recommendations SNF    Equipment Recommendations       Recommendations for Other Services       Precautions / Restrictions Precautions Precautions: Fall Restrictions Weight Bearing Restrictions: No      Mobility  Bed Mobility Overal bed mobility: Needs Assistance Bed Mobility: Supine to Sit     Supine to sit: Mod assist     General bed mobility comments: Pt very guarded and hesitant with transition  Transfers Overall transfer level: Needs assistance Equipment used: Rolling walker (2 wheeled) Transfers: Sit to/from Stand Sit to Stand: Mod assist         General transfer comment: Pt needs encouragement and cuing to get to standing, and needs assist to maintain balance once upright  Ambulation/Gait Ambulation/Gait assistance: Min assist Ambulation Distance (Feet): 20 Feet Assistive device: Rolling walker (2 wheeled)       General Gait Details: Pt with very slow, hesitant ambulation with heavy reliance on the walker, small step length and signficant fatigue with the minimal distance.  She shows good effort but was hesitant and very slow.  Stairs            Wheelchair Mobility    Modified Rankin  (Stroke Patients Only)       Balance                                             Pertinent Vitals/Pain Pain Assessment: 0-10 Pain Score: 6  (reports pain goes up with any movement) Pain Location: R hip     Home Living Family/patient expects to be discharged to:: Skilled nursing facility Living Arrangements: Spouse/significant other                    Prior Function Level of Independence: Independent         Comments: pt reports she gets out of the house daily and is able to do all she wants/needs     Hand Dominance        Extremity/Trunk Assessment   Upper Extremity Assessment: Overall WFL for tasks assessed           Lower Extremity Assessment: Generalized weakness (L LE grossly 4-/5, R LE grossly 3/5)         Communication   Communication: No difficulties  Cognition Arousal/Alertness: Awake/alert Behavior During Therapy: WFL for tasks assessed/performed;Restless Overall Cognitive Status: Within Functional Limits for tasks assessed                      General Comments  Exercises General Exercises - Lower Extremity Ankle Circles/Pumps: AROM;5 reps Quad Sets: Strengthening;5 reps Heel Slides: Strengthening;5 reps Hip ABduction/ADduction: Strengthening;5 reps      Assessment/Plan    PT Assessment Patient needs continued PT services  PT Diagnosis Difficulty walking;Generalized weakness   PT Problem List Decreased strength;Decreased range of motion;Decreased activity tolerance;Decreased balance;Decreased mobility;Decreased safety awareness  PT Treatment Interventions DME instruction;Gait training;Functional mobility training;Therapeutic activities;Therapeutic exercise;Balance training;Neuromuscular re-education;Patient/family education   PT Goals (Current goals can be found in the Care Plan section) Acute Rehab PT Goals Patient Stated Goal: eventually go home PT Goal Formulation: With patient Time For Goal  Achievement: 01/14/16 Potential to Achieve Goals: Fair    Frequency Min 2X/week   Barriers to discharge        Co-evaluation               End of Session Equipment Utilized During Treatment: Gait belt Activity Tolerance: Patient limited by fatigue;Patient limited by pain Patient left: with chair alarm set;with nursing/sitter in room;with call bell/phone within reach           Time: 0910-0946 PT Time Calculation (min) (ACUTE ONLY): 36 min   Charges:   PT Evaluation $PT Eval Low Complexity: 1 Procedure PT Treatments $Therapeutic Exercise: 8-22 mins   PT G Codes:        Malachi Pro, DPT 12/31/2015, 11:55 AM

## 2016-01-01 LAB — BASIC METABOLIC PANEL
Anion gap: 7 (ref 5–15)
BUN: 13 mg/dL (ref 6–20)
CHLORIDE: 105 mmol/L (ref 101–111)
CO2: 25 mmol/L (ref 22–32)
Calcium: 8.1 mg/dL — ABNORMAL LOW (ref 8.9–10.3)
Creatinine, Ser: 0.69 mg/dL (ref 0.44–1.00)
GFR calc non Af Amer: >60 mL/min (ref >60)
GLUCOSE: 120 mg/dL — AB (ref 65–99)
Potassium: 3.5 mmol/L (ref 3.5–5.1)
Sodium: 137 mmol/L (ref 135–145)

## 2016-01-01 MED ORDER — TRAMADOL HCL 50 MG PO TABS
50.0000 mg | ORAL_TABLET | Freq: Four times a day (QID) | ORAL | Status: DC | PRN
  Administered 2016-01-01 – 2016-01-03 (×6): 50 mg via ORAL
  Filled 2016-01-01 (×6): qty 1

## 2016-01-01 MED ORDER — METHOCARBAMOL 500 MG PO TABS
500.0000 mg | ORAL_TABLET | Freq: Four times a day (QID) | ORAL | Status: DC | PRN
  Administered 2016-01-01 – 2016-01-02 (×4): 500 mg via ORAL
  Filled 2016-01-01 (×5): qty 1

## 2016-01-01 MED ORDER — TRAMADOL-ACETAMINOPHEN 37.5-325 MG PO TABS
1.0000 | ORAL_TABLET | Freq: Four times a day (QID) | ORAL | Status: DC | PRN
  Administered 2016-01-01 (×2): 1 via ORAL
  Filled 2016-01-01 (×2): qty 1

## 2016-01-01 MED ORDER — FLUCONAZOLE 100 MG PO TABS
200.0000 mg | ORAL_TABLET | Freq: Once | ORAL | Status: AC
  Administered 2016-01-01: 200 mg via ORAL
  Filled 2016-01-01 (×2): qty 2

## 2016-01-01 MED ORDER — SALINE SPRAY 0.65 % NA SOLN
1.0000 | NASAL | Status: DC | PRN
  Administered 2016-01-01: 1 via NASAL
  Filled 2016-01-01: qty 44

## 2016-01-01 NOTE — Care Management Important Message (Signed)
Important Message  Patient Details  Name: Elizabeth Shepard MRN: [id number] Date of Birth: 29-Apr-1941   Medicare Important Message Given:  Yes    Adonis HugueninBerkhead, Ilithyia Titzer L, RN 01/01/2016, 1:41 PM

## 2016-01-01 NOTE — Progress Notes (Signed)
Patient requested fluids to be given by IV, stated she doesn't want to drink fluids so she doesn't have to get up out of bed. Patient education given, that as long as she is alert she could drink.

## 2016-01-01 NOTE — Progress Notes (Signed)
md notified of pt complaint of pain and "only pain med that works is the tramadol" ordered for q 8hr prn given at 2244 7/16. MD ordered to change to q 6 hr prn

## 2016-01-01 NOTE — Progress Notes (Signed)
[facility] Physicians -  at [facility]   PATIENT NAME: Elizabeth Shepard    MR#:  [id number]  DATE OF BIRTH:  22-Aug-1940  SUBJECTIVE:  CHIEF COMPLAINT:   Chief Complaint  Patient presents with  . Fall   - No fevers today. But complains of significant right hip pain. CT and x-rays negative so far.  REVIEW OF SYSTEMS:  Review of Systems  Constitutional: Positive for malaise/fatigue. Negative for fever and chills.  HENT: Negative for ear discharge, ear pain and nosebleeds.   Eyes: Negative for blurred vision and double vision.  Respiratory: Negative for cough, shortness of breath and wheezing.   Cardiovascular: Negative for chest pain, palpitations and leg swelling.  Gastrointestinal: Negative for nausea, vomiting, abdominal pain, diarrhea and constipation.  Genitourinary: Negative for dysuria.  Musculoskeletal: Positive for joint pain.       Right hip pain  Neurological: Negative for dizziness, sensory change, speech change, focal weakness, seizures and headaches.  Psychiatric/Behavioral: Negative for depression.    DRUG ALLERGIES:   Allergies  Allergen Reactions  . Ciprofloxacin Itching and Rash    VITALS:  Blood pressure 142/68, pulse 82, temperature 98.2 F (36.8 C), temperature source Oral, resp. rate 18, height  (1.626 m), weight 80.786 kg (178 lb 1.6 oz), SpO2 92 %.  PHYSICAL EXAMINATION:  Physical Exam  GENERAL:  75 y.o.-year-old patient lying in the bed, not in any acute distress. Mental status back to baseline. EYES: Pupils equal, round, reactive to light and accommodation. No scleral icterus. Extraocular muscles intact.  HEENT: Head atraumatic, normocephalic. Oropharynx and nasopharynx clear.  NECK:  Supple, no jugular venous distention. No thyroid enlargement, no tenderness.  LUNGS: Normal breath sounds bilaterally, no wheezing, rales,rhonchi or crepitation. No use of accessory muscles of respiration.  decreased bibasilar breath  sounds CARDIOVASCULAR: S1, S2 normal. No murmurs, rubs, or gallops.  ABDOMEN: Soft, nontender, nondistended. Bowel sounds present. No organomegaly or mass.  EXTREMITIES: No pedal edema, cyanosis, or clubbing.  NEUROLOGIC: Cranial nerves II through XII are intact. Muscle strength 5/5 in all extremities Except right leg, movement is limited due to right hip pain. Most pain with hip abduction. Sensation intact. Gait not checked.  PSYCHIATRIC: The patient is alert and oriented x 3.  SKIN: No obvious rash, lesion, or ulcer.    LABORATORY PANEL:   CBC  Recent Labs Lab 12/31/15 0427  WBC 10.1  HGB 11.3*  HCT 32.1*  PLT 235   ------------------------------------------------------------------------------------------------------------------  Chemistries   Recent Labs Lab 12/28/15 1951  01/01/16 0458  NA 136  < > 137  K 4.3  < > 3.5  CL 100*  < > 105  CO2 23  < > 25  GLUCOSE 106*  < > 120*  BUN 17  < > 13  CREATININE 1.08*  < > 0.69  CALCIUM 9.5  < > 8.1*  AST 49*  --   --   ALT 28  --   --   ALKPHOS 65  --   --   BILITOT 1.0  --   --   < > = values in this interval not displayed. ------------------------------------------------------------------------------------------------------------------  Cardiac Enzymes  Recent Labs Lab 12/29/15 1213  TROPONINI 0.03*   ------------------------------------------------------------------------------------------------------------------  RADIOLOGY:  No results found.  EKG:   Orders placed or performed during the hospital encounter of 12/28/15  . ED EKG  . ED EKG  . EKG 12-Lead  . EKG 12-Lead  . EKG 12-Lead  . EKG 12-Lead  ASSESSMENT AND PLAN:   75 year old female with past medical history significant for hypertension, hypothyroidism, hyperlipidemia and bilateral hip replacement surgeries presents to the hospital after a confusion and near syncopal episode.  #1 Near-syncope/confusion-secondary to sepsis. -No Further  syncopal episodes. -CT of the head is negative. -physical therapy.   #2 sepsis-secondary to likely urinary tract infection. -Blood cultures are negative.  Urine cultures growing pan sensitive enterococcus - change ABX to ampicillin-changed to oral ampicillin tomorrow - clinically better, no fevers today - consulted  ID over the phone. If repeat fever spikes >100.47F, will draw repeat blood cultures  #3 right hip pain-x-rays are negative. musculoskeletal pain and bursitis. -Due to persistent pain, CT of the hip ordered- discussed with orthopedics- no fracture noted, - started ultracet, bone scan tomorrow to rule out other causes. MRI cannot be ordered due to metal from previous hip replacements. - heat pad, weight bearing as tolerated - Physical therapy consult. Will need rehabilitation at discharge  #4 hypothyroidism-on Synthroid. TSH within normal limits.  #5 depression and anxiety-continue Zoloft.  #6 DVT prophylaxis-on Lovenox  Physical therapy consulted.  Needs rehab. Possible discharge tomorrow if pain is better controlled Updated son [name] over the phone    All the records are reviewed and case discussed with Care Management/Social Workerr. Management plans discussed with the patient, family and they are in agreement.  CODE STATUS: Full code  TOTAL TIME TAKING CARE OF THIS PATIENT: 37 minutes.   POSSIBLE D/C IN 1 DAY, DEPENDING ON CLINICAL CONDITION.   Enid BaasKALISETTI,Mahi Zabriskie M.D on 01/01/2016 at 2:08 PM  Between 7am to 6pm - Pager - [phone]  After 6pm go to [email or web address] - password [email or web address] [facility]  [facility]  Office  [phone]  CC: Primary care physician; [facility]

## 2016-01-01 NOTE — Progress Notes (Signed)
MD and this writer assessed vaginal area r/t patient complaints of pain in area. Noted with redness around labia, no open areas noted. Patient with minimal white discharge. MD with new orders for yeast infection.

## 2016-01-01 NOTE — Progress Notes (Signed)
MD aware of eye drops, patient can use own medication

## 2016-01-01 NOTE — Progress Notes (Signed)
Physical Therapy Treatment Patient Details Name: Elizabeth Shepard MRN: [id number] DOB: 02-28-1941 Today's Date: 01/01/2016    History of Present Illness 75 y/o female with recent onset weakness.  She has elevated WBC count and found to have UTI.  Pt also with some R hip pain (remote history of b/l hip replacements)    PT Comments    Pt agreeable to PT; reports 8/10 pain in R hip. Pt initially refuses out of bed, but post supine exercises notes needing to use the bathroom. Pt requires heavy cueing for bed mobility, transfers and ambulation bed to bedside commode with Mod to Min A/guard. Pt requires Min guard assist for personal hygiene in stand. Pt encouraged to ambulate to chair and sit in chair for a while; pt agreeable. Ambulation antalgic and slow with decreased range throughout Bilateral lower extremities and weight bearing on Right lower extremity, but does so with only Min guard physical assist. Pt received up in chair comfortably. Pt encouraged to perform exercises in chair throughout the day as well as when returned to bed. Pt understands. Continue PT to progress range, strength to improve all functional mobility.   Follow Up Recommendations  SNF     Equipment Recommendations       Recommendations for Other Services       Precautions / Restrictions Precautions Precautions: Fall Restrictions Weight Bearing Restrictions: No    Mobility  Bed Mobility Overal bed mobility: Needs Assistance Bed Mobility: Supine to Sit     Supine to sit: Mod assist     General bed mobility comments: Heavy cueing for sequence and encouragement  Transfers Overall transfer level: Needs assistance Equipment used: Rolling walker (2 wheeled) Transfers: Sit to/from Stand Sit to Stand: Min assist         General transfer comment: Cues for sequence, hand placement and technique for attaining upright posture  Ambulation/Gait Ambulation/Gait assistance: Min assist Ambulation Distance (Feet): 20  Feet (also bed to bedside commode initially several feet) Assistive device: Rolling walker (2 wheeled) Gait Pattern/deviations: Step-to pattern;Step-through pattern;Decreased stride length;Decreased dorsiflexion - right;Decreased dorsiflexion - left;Decreased weight shift to right;Antalgic (decreased foot clearance B) Gait velocity: decreeased Gait velocity interpretation: <1.8 ft/sec, indicative of risk for recurrent falls General Gait Details: Rw lowered for more appropriate fit and approved ability to bear weight through UEs. Extremely slow with inconsistent step to to partial step through. Very limited motion in R > L BLES throughout. Many pauses. Cues for chair approach   Stairs            Wheelchair Mobility    Modified Rankin (Stroke Patients Only)       Balance Overall balance assessment: Needs assistance Sitting-balance support: Bilateral upper extremity supported;Feet supported Sitting balance-Leahy Scale: Good     Standing balance support: Bilateral upper extremity supported Standing balance-Leahy Scale: Fair                      Cognition Arousal/Alertness: Awake/alert Behavior During Therapy: WFL for tasks assessed/performed;Restless Overall Cognitive Status: Within Functional Limits for tasks assessed                      Exercises General Exercises - Lower Extremity Ankle Circles/Pumps: AROM;Both;20 reps;Supine Quad Sets: Strengthening;Both;10 reps;Supine Gluteal Sets: Strengthening;Both;10 reps;Supine Long Arc Quad: AROM;Both;10 reps;Seated Hip ABduction/ADduction: AROM;Both;10 reps;Seated (knees bent) Hip Flexion/Marching: AROM;Both;10 reps;Seated    General Comments        Pertinent Vitals/Pain Pain Assessment: 0-10 Pain Score: 8  Pain Location: RLE    Home Living                      Prior Function            PT Goals (current goals can now be found in the care plan section) Progress towards PT goals:  Progressing toward goals (slowly)    Frequency  Min 2X/week    PT Plan Current plan remains appropriate    Co-evaluation             End of Session Equipment Utilized During Treatment: Gait belt Activity Tolerance: Patient tolerated treatment well Patient left: in chair;with call bell/phone within reach;with chair alarm set     Time: 1049-1130 PT Time Calculation (min) (ACUTE ONLY): 41 min  Charges:  $Gait Training: 8-22 mins $Therapeutic Exercise: 8-22 mins $Therapeutic Activity: 8-22 mins                    G Codes:      Kristeen MissHeidi Elizabeth Bishop, PTA 01/01/2016, 1:20 PM

## 2016-01-02 ENCOUNTER — Inpatient Hospital Stay: Payer: Medicare Other

## 2016-01-02 MED ORDER — METHOCARBAMOL 500 MG PO TABS: 500.0000 mg | ORAL_TABLET | Freq: Four times a day (QID) | ORAL | Status: DC | PRN

## 2016-01-02 MED ORDER — DOCUSATE SODIUM 100 MG PO CAPS: 100.0000 mg | ORAL_CAPSULE | Freq: Two times a day (BID) | ORAL | Status: DC

## 2016-01-02 MED ORDER — FERROUS SULFATE 325 (65 FE) MG PO TABS: 325.0000 mg | ORAL_TABLET | Freq: Every day | ORAL | Status: DC

## 2016-01-02 MED ORDER — LIDOCAINE 5 % EX PTCH: 1.0000 | MEDICATED_PATCH | CUTANEOUS | Status: DC

## 2016-01-02 MED ORDER — SENNA 8.6 MG PO TABS: 1.0000 | ORAL_TABLET | Freq: Every day | ORAL | Status: DC

## 2016-01-02 MED ORDER — TRAMADOL HCL 50 MG PO TABS: 50.0000 mg | ORAL_TABLET | Freq: Four times a day (QID) | ORAL | Status: DC | PRN

## 2016-01-02 MED ORDER — TECHNETIUM TC 99M MEDRONATE IV KIT
22.5300 | PACK | Freq: Once | INTRAVENOUS | Status: AC | PRN
  Administered 2016-01-02: 22.53 via INTRAVENOUS

## 2016-01-02 MED ORDER — AMOXICILLIN 500 MG PO CAPS
500.0000 mg | ORAL_CAPSULE | Freq: Three times a day (TID) | ORAL | Status: DC
  Administered 2016-01-02 – 2016-01-03 (×4): 500 mg via ORAL
  Filled 2016-01-02 (×8): qty 1

## 2016-01-02 MED ORDER — POLYETHYLENE GLYCOL 3350 17 G PO PACK
17.0000 g | PACK | Freq: Every day | ORAL | Status: DC | PRN
Start: 2016-01-02 — End: 2016-01-19

## 2016-01-02 MED ORDER — AMOXICILLIN 500 MG PO CAPS: 500.0000 mg | ORAL_CAPSULE | Freq: Three times a day (TID) | ORAL | Status: DC

## 2016-01-02 MED ORDER — BISACODYL 10 MG RE SUPP
10.0000 mg | Freq: Once | RECTAL | Status: AC
  Administered 2016-01-02: 10 mg via RECTAL
  Filled 2016-01-02: qty 1

## 2016-01-02 NOTE — Progress Notes (Signed)
Patient refusing to be d/c'd to snf. States she is too weak and needs to be pain free.

## 2016-01-02 NOTE — Progress Notes (Signed)
[facility] Verbal encouragement given to patient. Patient denies being finish and request to stay. Call bell within reach. Nursing informed to check on patient

## 2016-01-02 NOTE — Progress Notes (Signed)
Per MD patient is not discharging today. Plan is for patient to D/C to [facility] when stable. [facility] liaison is aware of above. Clinical Social Worker (CSW) will continue to follow and assist as needed.   Baker Hughes IncorporatedBailey Lisette Mancebo, LCSW [phone]

## 2016-01-02 NOTE — Progress Notes (Signed)
Physical Therapy Treatment Patient Details Name: Dallie DadJoyce C Maddison MRN: [id number] DOB: [birth year] Today's Date: 01/02/2016    History of Present Illness 75 y/o female with recent onset weakness.  She has elevated WBC count and found to have UTI.  Pt also with some R hip pain (remote history of b/l hip replacements)    PT Comments    Pt agreeable to bed exercises only this morning. Pt notes she has been up and is currently awaiting a test. Pt continues with pain in right hip and lower extremity. Participates in bed exercises with assist required on right for several exercises. Encouraged performance of exercises throughout the day; pt understands. Continue PT to progress range and strength in Right lower extremity and throughout body to improve functional mobility to prior level of function.   Follow Up Recommendations  SNF     Equipment Recommendations       Recommendations for Other Services       Precautions / Restrictions Precautions Precautions: Fall Restrictions Weight Bearing Restrictions: No    Mobility  Bed Mobility               General bed mobility comments: Not tested, notes waiting to leave for test; up in chair earlier this morning and ambulated to the bathroom  Transfers                    Ambulation/Gait                 Stairs            Wheelchair Mobility    Modified Rankin (Stroke Patients Only)       Balance                                    Cognition Arousal/Alertness: Awake/alert Behavior During Therapy: WFL for tasks assessed/performed;Restless Overall Cognitive Status: Within Functional Limits for tasks assessed                      Exercises General Exercises - Lower Extremity Ankle Circles/Pumps: AROM;Both;20 reps;Supine Quad Sets: Strengthening;Both;20 reps;Supine Gluteal Sets: Strengthening;Both;20 reps;Supine Short Arc Quad: AROM;Both;20 reps;Supine Heel Slides: AAROM;Right;20  reps;Supine (AROM L) Hip ABduction/ADduction: AAROM;Right;20 reps;Supine (L AROM) Straight Leg Raises: AAROM;Both;10 reps;Supine    General Comments        Pertinent Vitals/Pain Pain Assessment: 0-10 Pain Score: 8  Pain Location: R hip/LE Pain Intervention(s): Limited activity within patient's tolerance;Monitored during session;Premedicated before session    Home Living                      Prior Function            PT Goals (current goals can now be found in the care plan section) Progress towards PT goals: Progressing toward goals (slowly)    Frequency  Min 2X/week    PT Plan Current plan remains appropriate    Co-evaluation             End of Session   Activity Tolerance: Patient tolerated treatment well;Patient limited by pain (RLE limited by pain) Patient left: in bed;with call bell/phone within reach;with bed alarm set     Time: 1000-1024 PT Time Calculation (min) (ACUTE ONLY): 24 min  Charges:  $Therapeutic Exercise: 23-37 mins  G CodesKristeen Miss, [place] 01/02/2016, 12:21 PM

## 2016-01-02 NOTE — Progress Notes (Signed)
[name]    MR#:  [id number]  DATE OF BIRTH:  Mar 20, 1941  SUBJECTIVE:  CHIEF COMPLAINT:   Chief Complaint  Patient presents with  . Fall   - Right hip pain persists, but better. Patient was able to get up with assistance and take a few steps today. -Bone scan today to rule out any other underlying lesions in the right hip. Overall improving. No further fevers or chills.  REVIEW OF SYSTEMS:  Review of Systems  Constitutional: Positive for malaise/fatigue. Negative for fever and chills.  HENT: Negative for ear discharge, ear pain and nosebleeds.   Eyes: Negative for blurred vision and double vision.  Respiratory: Negative for cough, shortness of breath and wheezing.   Cardiovascular: Negative for chest pain, palpitations and leg swelling.  Gastrointestinal: Negative for nausea, vomiting, abdominal pain, diarrhea and constipation.  Genitourinary: Positive for dysuria.  Musculoskeletal: Positive for joint pain.       Right hip pain  Neurological: Negative for dizziness, sensory change, speech change, focal weakness, seizures and headaches.  Psychiatric/Behavioral: Negative for depression.    DRUG ALLERGIES:   Allergies  Allergen Reactions  . Ciprofloxacin Itching and Rash    VITALS:  Blood pressure 113/52, pulse 74, temperature 98 F (36.7 C), temperature source Oral, resp. rate 19, height  (1.626 m), weight 80.786 kg (178 lb 1.6 oz), SpO2 94 %.  PHYSICAL EXAMINATION:  Physical Exam  GENERAL:  75 y.o.-year-old patient lying in the bed, not in any acute distress. Mental status back to baseline. EYES: Pupils equal, round, reactive to light and accommodation. No scleral icterus. Extraocular muscles intact.  HEENT: Head atraumatic, normocephalic. Oropharynx and nasopharynx clear.  NECK:  Supple, no jugular venous distention. No thyroid enlargement, no tenderness.  LUNGS: Normal breath sounds  bilaterally, no wheezing, rales,rhonchi or crepitation. No use of accessory muscles of respiration.  decreased bibasilar breath sounds CARDIOVASCULAR: S1, S2 normal. No murmurs, rubs, or gallops.  ABDOMEN: Soft, nontender, nondistended. Bowel sounds present. No organomegaly or mass.  EXTREMITIES: No pedal edema, cyanosis, or clubbing.  NEUROLOGIC: Cranial nerves II through XII are intact. Muscle strength 5/5 in all extremities Except right leg, movement is limited due to right hip pain. Most pain with hip abduction. Improving slowly. Sensation intact. Gait not checked.  PSYCHIATRIC: The patient is alert and oriented x 3.  SKIN: No obvious rash, lesion, or ulcer.    LABORATORY PANEL:   CBC  Recent Labs Lab 12/31/15 0427  WBC 10.1  HGB 11.3*  HCT 32.1*  PLT 235   ------------------------------------------------------------------------------------------------------------------  Chemistries   Recent Labs Lab 12/28/15 1951  01/01/16 0458  NA 136  < > 137  K 4.3  < > 3.5  CL 100*  < > 105  CO2 23  < > 25  GLUCOSE 106*  < > 120*  BUN 17  < > 13  CREATININE 1.08*  < > 0.69  CALCIUM 9.5  < > 8.1*  AST 49*  --   --   ALT 28  --   --   ALKPHOS 65  --   --   BILITOT 1.0  --   --   < > = values in this interval not displayed. ------------------------------------------------------------------------------------------------------------------  Cardiac Enzymes  Recent Labs Lab 12/29/15 1213  TROPONINI 0.03*   ------------------------------------------------------------------------------------------------------------------  RADIOLOGY:  No results found.  EKG:   Orders placed or performed during the hospital encounter of 12/28/15  .  ED EKG  . ED EKG  . EKG 12-Lead  . EKG 12-Lead  . EKG 12-Lead  . EKG 12-Lead    ASSESSMENT AND PLAN:   75 year old female with past medical history significant for hypertension, hypothyroidism, hyperlipidemia and bilateral hip replacement  surgeries presents to the hospital after a confusion and near syncopal episode.  #1 Near-syncope/confusion-secondary to sepsis. -No Further syncopal episodes. -CT of the head is negative. -physical therapy.   #2 sepsis-secondary to likely urinary tract infection. -Blood cultures are negative.  Urine cultures growing pan sensitive enterococcus - Currently on oral amoxicillin. - clinically better, no fevers  - consulted [place] ID over the phone.  #3 right hip pain-x-rays are negative. musculoskeletal pain and bursitis. -Due to persistent pain, CT of the hip ordered- discussed with orthopedics- no fracture noted, - started tramadol, physical therapy has been recommended. -No cortisone injection due to infection underlying. -Since patient is still complaining of pain, bone scan today to rule out other causes. MRI cannot be ordered due to metal from previous hip replacements. -However clinically improving. - heat pad, weight bearing as tolerated - Physical therapy consult. Will need rehabilitation at discharge  #4 hypothyroidism-on Synthroid. TSH within normal limits.  #5 depression and anxiety-continue Zoloft.  #6 DVT prophylaxis-on Lovenox  Physical therapy consulted.  Needs rehab. Possible discharge today if bone scan is negative    All the records are reviewed and case discussed with Care Management/Social Workerr. Management plans discussed with the patient, family and they are in agreement.  CODE STATUS: Full code  TOTAL TIME TAKING CARE OF THIS PATIENT: 36 minutes.   POSSIBLE D/C TODAY AND TOMORROW, DEPENDING ON CLINICAL CONDITION.   Enid BaasKALISETTI,Elizabeth Shepard M.D on 01/02/2016 at 1:28 PM  Between 7am to 6pm - Pager - [phone]  After 6pm go to [email or web address] - password [email or web address] [facility]  [facility]  Office  [phone]  CC: Primary care physician; [facility]

## 2016-01-02 NOTE — Progress Notes (Signed)
Bone scan confirms bone bruise at greater trochanter.  Continue symptomatic treatment, ok to be WBAT. Should resolve in 4-6 weeks.

## 2016-01-02 NOTE — Discharge Summary (Signed)
[facility] - [place] at [facility]   PATIENT NAME: Elizabeth Shepard    MR#:  [id number]  DATE OF BIRTH:  06-16-1941  DATE OF ADMISSION:  12/28/2015 ADMITTING PHYSICIAN: Oralia Manis, MD  DATE OF DISCHARGE: 01/02/2016  PRIMARY CARE PHYSICIAN: [facility]    ADMISSION DIAGNOSIS:  Weakness [R53.1] Pain [R52] Fall, initial encounter [W19.XXXA] Hip pain, acute, right [M25.551]  DISCHARGE DIAGNOSIS:  Principal Problem:   UTI (lower urinary tract infection) Active Problems:   HLD (hyperlipidemia)   BP (high blood pressure)   Hypothyroidism   Weakness   Abnormal EKG   Sepsis (HCC)   SECONDARY DIAGNOSIS:   Past Medical History  Diagnosis Date  . Hypertension   . Hypercholesteremia   . Thyroid disease   . Generalized pain   . Depression   . Hypothyroidism   . UTI (lower urinary tract infection)     HOSPITAL COURSE:   75 year old female with past medical history significant for hypertension, hypothyroidism, hyperlipidemia and bilateral hip replacement surgeries presents to the hospital after a confusion and near syncopal episode.  #1 Near-syncope/confusion-secondary to sepsis. -No Further syncopal episodes. -CT of the head is negative. -physical therapy.   #2 sepsis-secondary to likely urinary tract infection. -Blood cultures are negative. Urine cultures growing pan sensitive enterococcus - Currently on oral amoxicillin. Continue for 4 more days - clinically better, no fevers  - consulted  ID over the phone.  #3 right hip pain-x-rays are negative. musculoskeletal pain and bursitis. -Due to persistent pain, CT of the hip ordered- discussed with orthopedics- no fracture noted, - started tramadol, physical therapy has been recommended. -No cortisone injection due to infection underlying with UTI. -Since patient is still complaining of pain, bone scan today to rule out other causes. MRI cannot be ordered due to metal from  previous hip replacements. -However clinically improving. - heat pad, weight bearing as tolerated - Physical therapy consult. Will need rehabilitation at discharge  #4 hypothyroidism-on Synthroid. TSH within normal limits.  #5 depression and anxiety-continue Zoloft.  Being discharged to rehab today  DISCHARGE CONDITIONS:   Guarded  CONSULTS OBTAINED:  Treatment Team:  Kennedy Bucker, MD  DRUG ALLERGIES:   Allergies  Allergen Reactions  . Ciprofloxacin Itching and Rash    DISCHARGE MEDICATIONS:   Current Discharge Medication List    START taking these medications   Details  amoxicillin (AMOXIL) 500 MG capsule Take 1 capsule (500 mg total) by mouth every 8 (eight) hours. X 4 more days Qty: 12 capsule, Refills: 0    docusate sodium (COLACE) 100 MG capsule Take 1 capsule (100 mg total) by mouth 2 (two) times daily. Qty: 10 capsule, Refills: 0    lidocaine (LIDODERM) 5 % Place 1 patch onto the skin daily. Remove & Discard patch within 12 hours or as directed by MD Apply to right hip Qty: 30 patch, Refills: 0    methocarbamol (ROBAXIN) 500 MG tablet Take 1 tablet (500 mg total) by mouth every 6 (six) hours as needed for muscle spasms. X 5 days Qty: 20 tablet, Refills: 0    polyethylene glycol (MIRALAX / GLYCOLAX) packet Take 17 g by mouth daily as needed for mild constipation. Qty: 14 each, Refills: 0    senna (SENOKOT) 8.6 MG TABS tablet Take 1 tablet (8.6 mg total) by mouth daily. Qty: 120 each, Refills: 0      CONTINUE these medications which have CHANGED   Details  ferrous sulfate 325 (65 FE) MG  tablet Take 1 tablet (325 mg total) by mouth daily. Qty: 90 tablet, Refills: 0    traMADol (ULTRAM) 50 MG tablet Take 1 tablet (50 mg total) by mouth every 6 (six) hours as needed for moderate pain. Qty: 30 tablet, Refills: 0      CONTINUE these medications which have NOT CHANGED   Details  atorvastatin (LIPITOR) 40 MG tablet Take 40 mg by mouth at bedtime.      cetirizine (ZYRTEC) 10 MG tablet Take 10 mg by mouth daily as needed for allergies.    conjugated estrogens (PREMARIN) vaginal cream Place 1 Applicatorful vaginally every 3 (three) days.    Cranberry 1000 MG CAPS Take 1,000 mg by mouth daily.    levothyroxine (SYNTHROID, LEVOTHROID) 25 MCG tablet Take 25 mcg by mouth daily before breakfast.    Multiple Vitamin (MULTIVITAMIN WITH MINERALS) TABS tablet Take 1 tablet by mouth daily.    pantoprazole (PROTONIX) 40 MG tablet Take 1 tablet (40 mg total) by mouth 2 (two) times daily before a meal. Qty: 60 tablet, Refills: 0    sertraline (ZOLOFT) 100 MG tablet Take 100 mg by mouth at bedtime.    sucralfate (CARAFATE) 1 g tablet Take 1 g by mouth 2 (two) times daily.      STOP taking these medications     lisinopril (PRINIVIL,ZESTRIL) 10 MG tablet      fluconazole (DIFLUCAN) 50 MG tablet      phenazopyridine (PYRIDIUM) 100 MG tablet      sulfamethoxazole-trimethoprim (BACTRIM) 400-80 MG tablet          DISCHARGE INSTRUCTIONS:   1. PCP follow-up in 1-2 weeks 2. Orthopedics follow-up in 1-2 weeks  If you experience worsening of your admission symptoms, develop shortness of breath, life threatening emergency, suicidal or homicidal thoughts you must seek medical attention immediately by calling 911 or calling your MD immediately  if symptoms less severe.  You Must read complete instructions/literature along with all the possible adverse reactions/side effects for all the Medicines you take and that have been prescribed to you. Take any new Medicines after you have completely understood and accept all the possible adverse reactions/side effects.   Please note  You were cared for by a hospitalist during your hospital stay. If you have any questions about your discharge medications or the care you received while you were in the hospital after you are discharged, you can call the unit and asked to speak with the hospitalist on call if the  hospitalist that took care of you is not available. Once you are discharged, your primary care physician will handle any further medical issues. Please note that NO REFILLS for any discharge medications will be authorized once you are discharged, as it is imperative that you return to your primary care physician (or establish a relationship with a primary care physician if you do not have one) for your aftercare needs so that they can reassess your need for medications and monitor your lab values.    Today   CHIEF COMPLAINT:   Chief Complaint  Patient presents with  . Fall    VITAL SIGNS:  Blood pressure 140/61, pulse 85, temperature 97.8 F (36.6 C), temperature source Oral, resp. rate 18, height 5\' 4"  (1.626 m), weight 80.786 kg (178 lb 1.6 oz), SpO2 97 %.  I/O:   Intake/Output Summary (Last 24 hours) at 01/02/16 1421 Last data filed at 01/02/16 0900  Gross per 24 hour  Intake    530 ml  Output  0 ml  Net    530 ml    PHYSICAL EXAMINATION:   Physical Exam  GENERAL: 75 y.o.-year-old patient lying in the bed, not in any acute distress. Mental status back to baseline. EYES: Pupils equal, round, reactive to light and accommodation. No scleral icterus. Extraocular muscles intact.  HEENT: Head atraumatic, normocephalic. Oropharynx and nasopharynx clear.  NECK: Supple, no jugular venous distention. No thyroid enlargement, no tenderness.  LUNGS: Normal breath sounds bilaterally, no wheezing, rales,rhonchi or crepitation. No use of accessory muscles of respiration. decreased bibasilar breath sounds CARDIOVASCULAR: S1, S2 normal. No murmurs, rubs, or gallops.  ABDOMEN: Soft, nontender, nondistended. Bowel sounds present. No organomegaly or mass.  EXTREMITIES: No pedal edema, cyanosis, or clubbing.  NEUROLOGIC: Cranial nerves II through XII are intact. Muscle strength 5/5 in all extremities Except right leg, movement is limited due to right hip pain. Most pain with hip  abduction. Improving slowly. Sensation intact. Gait not checked.  PSYCHIATRIC: The patient is alert and oriented x 3.  SKIN: No obvious rash, lesion, or ulcer.   DATA REVIEW:   CBC  Recent Labs Lab 12/31/15 0427  WBC 10.1  HGB 11.3*  HCT 32.1*  PLT 235    Chemistries   Recent Labs Lab 12/28/15 1951  01/01/16 0458  NA 136  < > 137  K 4.3  < > 3.5  CL 100*  < > 105  CO2 23  < > 25  GLUCOSE 106*  < > 120*  BUN 17  < > 13  CREATININE 1.08*  < > 0.69  CALCIUM 9.5  < > 8.1*  AST 49*  --   --   ALT 28  --   --   ALKPHOS 65  --   --   BILITOT 1.0  --   --   < > = values in this interval not displayed.  Cardiac Enzymes  Recent Labs Lab 12/29/15 1213  TROPONINI 0.03*    Microbiology Results  Results for orders placed or performed during the hospital encounter of 12/28/15  Urine culture     Status: Abnormal   Collection Time: 12/28/15  7:51 PM  Result Value Ref Range Status   Specimen Description URINE, RANDOM  Final   Special Requests NONE  Final   Culture >=100,000 COLONIES/mL ENTEROCOCCUS SPECIES (A)  Final   Report Status 12/31/2015 FINAL  Final   Organism ID, Bacteria ENTEROCOCCUS SPECIES (A)  Final      Susceptibility   Enterococcus species - MIC*    AMPICILLIN <=2 SENSITIVE Sensitive     LEVOFLOXACIN 0.5 SENSITIVE Sensitive     NITROFURANTOIN <=16 SENSITIVE Sensitive     VANCOMYCIN 2 SENSITIVE Sensitive     * >=100,000 COLONIES/mL ENTEROCOCCUS SPECIES  Culture, blood (Routine X 2) w Reflex to ID Panel     Status: None (Preliminary result)   Collection Time: 12/29/15 12:25 AM  Result Value Ref Range Status   Specimen Description BLOOD LEFT HAND  Final   Special Requests   Final    BOTTLES DRAWN AEROBIC AND ANAEROBIC  4CCAERO, 4CCANA   Culture NO GROWTH 4 DAYS  Final   Report Status PENDING  Incomplete  Culture, blood (Routine X 2) w Reflex to ID Panel     Status: None (Preliminary result)   Collection Time: 12/29/15 12:25 AM  Result Value Ref Range  Status   Specimen Description BLOOD RIGHT WRIST  Final   Special Requests   Final    BOTTLES DRAWN AEROBIC  AND ANAEROBIC  6CCAERO, 3CCANA   Culture NO GROWTH 4 DAYS  Final   Report Status PENDING  Incomplete    RADIOLOGY:  No results found.  EKG:   Orders placed or performed during the hospital encounter of 12/28/15  . ED EKG  . ED EKG  . EKG 12-Lead  . EKG 12-Lead  . EKG 12-Lead  . EKG 12-Lead      Management plans discussed with the patient, family and they are in agreement.  CODE STATUS:     Code Status Orders        Start     Ordered   12/29/15 0107  Full code   Continuous     12/29/15 0106    Code Status History    Date Active Date Inactive Code Status Order ID Comments User Context   07/04/2015  6:48 PM 07/07/2015  9:24 PM Full Code [id number]  Alford Highland, MD ED      TOTAL TIME TAKING CARE OF THIS PATIENT: 37 minutes.    Enid Baas M.D on 01/02/2016 at 2:21 PM  Between 7am to 6pm - Pager - [phone]  After 6pm go to [email or web address] - password [email or web address] [facility]  [facility] [place] [facility]  Office  [phone]  CC: Primary care physician; [facility]

## 2016-01-03 LAB — CULTURE, BLOOD (ROUTINE X 2)
CULTURE: NO GROWTH
Culture: NO GROWTH

## 2016-01-03 LAB — URINALYSIS COMPLETE WITH MICROSCOPIC (ARMC ONLY)
BACTERIA UA: NONE SEEN
Bilirubin Urine: NEGATIVE
Glucose, UA: NEGATIVE mg/dL
KETONES UR: NEGATIVE mg/dL
Leukocytes, UA: NEGATIVE
NITRITE: NEGATIVE
PH: 7 (ref 5.0–8.0)
PROTEIN: NEGATIVE mg/dL
SPECIFIC GRAVITY, URINE: 1.004 — AB (ref 1.005–1.030)

## 2016-01-03 LAB — CBC
HEMATOCRIT: 33.7 % — AB (ref 35.0–47.0)
HEMOGLOBIN: 12 g/dL (ref 12.0–16.0)
MCH: 33.7 pg (ref 26.0–34.0)
MCHC: 35.8 g/dL (ref 32.0–36.0)
MCV: 94.2 fL (ref 80.0–100.0)
Platelets: 312 10*3/uL (ref 150–440)
RBC: 3.57 MIL/uL — AB (ref 3.80–5.20)
RDW: 13.4 % (ref 11.5–14.5)
WBC: 9.4 10*3/uL (ref 3.6–11.0)

## 2016-01-03 MED ORDER — OXYBUTYNIN CHLORIDE 5 MG PO TABS: 5.0000 mg | ORAL_TABLET | Freq: Two times a day (BID) | ORAL | Status: DC

## 2016-01-03 MED ORDER — OXYBUTYNIN CHLORIDE ER 5 MG PO TB24: 5.0000 mg | ORAL_TABLET | Freq: Every day | ORAL | Status: DC

## 2016-01-03 MED ORDER — METHOCARBAMOL 500 MG PO TABS
500.0000 mg | ORAL_TABLET | Freq: Three times a day (TID) | ORAL | Status: DC
  Administered 2016-01-03: 500 mg via ORAL
  Filled 2016-01-03 (×2): qty 1

## 2016-01-03 MED ORDER — METHOCARBAMOL 500 MG PO TABS: 500.0000 mg | ORAL_TABLET | Freq: Three times a day (TID) | ORAL | Status: DC

## 2016-01-03 MED ORDER — OXYBUTYNIN CHLORIDE ER 5 MG PO TB24
5.0000 mg | ORAL_TABLET | Freq: Every day | ORAL | Status: DC
  Filled 2016-01-03: qty 1

## 2016-01-03 NOTE — Progress Notes (Signed)
[facility] with Elizabeth Shepard[facility] at [phone], to notify of non-emergent EMS transport.  Auth notification reference given as [id number].   Service date range good from 01/03/16 - 04/02/16.   Gap exception requested to determine if services can be considered at an in-network level.

## 2016-01-03 NOTE — Progress Notes (Signed)
Patient is being discharge to SNF , report given to floor nurse , awaiting pick up by EMS 

## 2016-01-03 NOTE — Clinical Social Work Placement (Signed)
   CLINICAL SOCIAL WORK PLACEMENT  NOTE  Date:  01/03/2016  Patient Details  Name: Elizabeth Shepard MRN: [id number] Date of Birth: Apr 10, 1941  Clinical Social Work is seeking post-discharge placement for this patient at the Skilled  Nursing Facility level of care (*CSW will initial, date and re-position this form in  chart as items are completed):  Yes   Patient/family provided with [facility] Clinical Social Work Department's list of facilities offering this level of care within the geographic area requested by the patient (or if unable, by the patient's family).  Yes   Patient/family informed of their freedom to choose among providers that offer the needed level of care, that participate in Medicare, Medicaid or managed care program needed by the patient, have an available bed and are willing to accept the patient.  Yes   Patient/family informed of [facility]'s ownership interest in [facility] and [facility], as well as of the fact that they are under no obligation to receive care at these facilities.  PASRR submitted to EDS on 12/29/15     PASRR number received on 12/29/15     Existing PASRR number confirmed on       FL2 transmitted to all facilities in geographic area requested by pt/family on 12/29/15     FL2 transmitted to all facilities within larger geographic area on       Patient informed that his/her managed care company has contracts with or will negotiate with certain facilities, including the following:        Yes   Patient/family informed of bed offers received.  Patient chooses bed at  ([facility] )     Physician recommends and patient chooses bed at      Patient to be transferred to  ([facility] ) on 01/03/16.  Patient to be transferred to facility by  [facility]     Patient family notified on 01/03/16 of transfer.  Name of family member notified:   (Patient's son [name] is aware of D/C today. )     PHYSICIAN       Additional Comment:     _______________________________________________ [name], Ladon ApplebaumBailey G, LCSW 01/03/2016, 12:39 PM

## 2016-01-03 NOTE — Progress Notes (Signed)
Patient is medically stable for D/C to [facility] today. Per [facility] liaison patient will go to room 605. RN will call report and arrange EMS for transport. Clinical Child psychotherapistocial Worker (CSW) sent D/C orders to [employer] via [employer]. Patient is aware of above. CSW contacted patient's son [name] and made him aware of above. Please reconsult if future social work needs arise. CSW signing off.   [employer] Klaryssa Fauth, LCSW [phone]

## 2016-01-03 NOTE — Discharge Summary (Signed)
[facility] - [place] at [facility]   PATIENT NAME: Elizabeth Shepard    MR#:  [id number]  DATE OF BIRTH:  [birth year]  DATE OF ADMISSION:  12/28/2015 ADMITTING PHYSICIAN: Oralia Manis, MD  DATE OF DISCHARGE: 01/03/2016  PRIMARY CARE PHYSICIAN: [facility]    ADMISSION DIAGNOSIS:  Weakness [R53.1] Pain [R52] Fall, initial encounter [W19.XXXA] Hip pain, acute, right [M25.551]  DISCHARGE DIAGNOSIS:  Principal Problem:   UTI (lower urinary tract infection) Active Problems:   HLD (hyperlipidemia)   BP (high blood pressure)   Hypothyroidism   Weakness   Abnormal EKG   Sepsis (HCC)   SECONDARY DIAGNOSIS:   Past Medical History  Diagnosis Date  . Hypertension   . Hypercholesteremia   . Thyroid disease   . Generalized pain   . Depression   . Hypothyroidism   . UTI (lower urinary tract infection)     HOSPITAL COURSE:   75 year old female with past medical history significant for hypertension, hypothyroidism, hyperlipidemia and bilateral hip replacement surgeries presents to the hospital after a confusion and near syncopal episode.  #1 Near-syncope/confusion-secondary to sepsis. -No Further syncopal episodes. -CT of the head is negative. -physical therapy.   #2 sepsis-secondary to likely urinary tract infection. -Blood cultures are negative. Urine cultures growing pan sensitive enterococcus - Currently on oral amoxicillin. Continue for 4 more days - clinically better, no fevers  - consulted [facility] over the phone.  #3 right hip pain-x-rays are negative. musculoskeletal pain and bursitis. -Due to persistent pain, CT of the hip ordered- discussed with orthopedics- no fracture noted, - started tramadol, physical therapy has been recommended. -No cortisone injection due to infection underlying with UTI. -Since patient is still complaining of pain, bone scan to rule out other causes. It shows bone bruising at right greater  trochanter site, ortho said that it will take about 3-4 weeks to heal. Continue pain control, physical therapy and heat pad as needed.  MRI cannot be ordered due to metal from previous hip replacements. -clinically improving. - heat pad, weight bearing as tolerated - Physical therapy consult. Will need rehabilitation at discharge  #4 hypothyroidism-on Synthroid. TSH within normal limits.  #5 depression and anxiety-continue Zoloft.  #6 Overactive bladder- low dose ditropan added at bedtime, recheck UA  Being discharged to rehab today  DISCHARGE CONDITIONS:   Guarded  CONSULTS OBTAINED:  Treatment Team:  Kennedy Bucker, MD  DRUG ALLERGIES:   Allergies  Allergen Reactions  . Ciprofloxacin Itching and Rash    DISCHARGE MEDICATIONS:   Current Discharge Medication List    START taking these medications   Details  amoxicillin (AMOXIL) 500 MG capsule Take 1 capsule (500 mg total) by mouth every 8 (eight) hours. X 4 more days Qty: 12 capsule, Refills: 0    docusate sodium (COLACE) 100 MG capsule Take 1 capsule (100 mg total) by mouth 2 (two) times daily. Qty: 10 capsule, Refills: 0    lidocaine (LIDODERM) 5 % Place 1 patch onto the skin daily. Remove & Discard patch within 12 hours or as directed by MD Apply to right hip Qty: 30 patch, Refills: 0    methocarbamol (ROBAXIN) 500 MG tablet Take 1 tablet (500 mg total) by mouth 3 (three) times daily. X 5 days Qty: 20 tablet, Refills: 0    oxybutynin (DITROPAN-XL) 5 MG 24 hr tablet Take 1 tablet (5 mg total) by mouth at bedtime. Qty: 10 tablet, Refills: 0    polyethylene glycol (MIRALAX / GLYCOLAX)  packet Take 17 g by mouth daily as needed for mild constipation. Qty: 14 each, Refills: 0    senna (SENOKOT) 8.6 MG TABS tablet Take 1 tablet (8.6 mg total) by mouth daily. Qty: 120 each, Refills: 0      CONTINUE these medications which have CHANGED   Details  ferrous sulfate 325 (65 FE) MG tablet Take 1 tablet (325 mg total) by  mouth daily. Qty: 90 tablet, Refills: 0    traMADol (ULTRAM) 50 MG tablet Take 1 tablet (50 mg total) by mouth every 6 (six) hours as needed for moderate pain. Qty: 30 tablet, Refills: 0      CONTINUE these medications which have NOT CHANGED   Details  atorvastatin (LIPITOR) 40 MG tablet Take 40 mg by mouth at bedtime.     cetirizine (ZYRTEC) 10 MG tablet Take 10 mg by mouth daily as needed for allergies.    conjugated estrogens (PREMARIN) vaginal cream Place 1 Applicatorful vaginally every 3 (three) days.    Cranberry 1000 MG CAPS Take 1,000 mg by mouth daily.    levothyroxine (SYNTHROID, LEVOTHROID) 25 MCG tablet Take 25 mcg by mouth daily before breakfast.    Multiple Vitamin (MULTIVITAMIN WITH MINERALS) TABS tablet Take 1 tablet by mouth daily.    pantoprazole (PROTONIX) 40 MG tablet Take 1 tablet (40 mg total) by mouth 2 (two) times daily before a meal. Qty: 60 tablet, Refills: 0    sertraline (ZOLOFT) 100 MG tablet Take 100 mg by mouth at bedtime.    sucralfate (CARAFATE) 1 g tablet Take 1 g by mouth 2 (two) times daily.      STOP taking these medications     lisinopril (PRINIVIL,ZESTRIL) 10 MG tablet      fluconazole (DIFLUCAN) 50 MG tablet      phenazopyridine (PYRIDIUM) 100 MG tablet      sulfamethoxazole-trimethoprim (BACTRIM) 400-80 MG tablet          DISCHARGE INSTRUCTIONS:   1. PCP follow-up in 1-2 weeks 2. Orthopedics follow-up in 1-2 weeks 3. APPLY HEATING PAD TO RIGHT HIP    If you experience worsening of your admission symptoms, develop shortness of breath, life threatening emergency, suicidal or homicidal thoughts you must seek medical attention immediately by calling 911 or calling your MD immediately  if symptoms less severe.  You Must read complete instructions/literature along with all the possible adverse reactions/side effects for all the Medicines you take and that have been prescribed to you. Take any new Medicines after you have  completely understood and accept all the possible adverse reactions/side effects.   Please note  You were cared for by a hospitalist during your hospital stay. If you have any questions about your discharge medications or the care you received while you were in the hospital after you are discharged, you can call the unit and asked to speak with the hospitalist on call if the hospitalist that took care of you is not available. Once you are discharged, your primary care physician will handle any further medical issues. Please note that NO REFILLS for any discharge medications will be authorized once you are discharged, as it is imperative that you return to your primary care physician (or establish a relationship with a primary care physician if you do not have one) for your aftercare needs so that they can reassess your need for medications and monitor your lab values.    Today   CHIEF COMPLAINT:   Chief Complaint  Patient presents with  .  Fall    VITAL SIGNS:  Blood pressure 118/62, pulse 81, temperature 98.3 F (36.8 C), temperature source Oral, resp. rate 18, height 5\' 4"  (1.626 m), weight 80.786 kg (178 lb 1.6 oz), SpO2 93 %.  I/O:    Intake/Output Summary (Last 24 hours) at 01/03/16 0753 Last data filed at 01/03/16 0542  Gross per 24 hour  Intake    603 ml  Output   1300 ml  Net   -697 ml    PHYSICAL EXAMINATION:   Physical Exam  GENERAL: 75 y.o.-year-old patient lying in the bed, not in any acute distress. Mental status back to baseline. EYES: Pupils equal, round, reactive to light and accommodation. No scleral icterus. Extraocular muscles intact.  HEENT: Head atraumatic, normocephalic. Oropharynx and nasopharynx clear.  NECK: Supple, no jugular venous distention. No thyroid enlargement, no tenderness.  LUNGS: Normal breath sounds bilaterally, no wheezing, rales,rhonchi or crepitation. No use of accessory muscles of respiration. decreased bibasilar breath  sounds CARDIOVASCULAR: S1, S2 normal. No murmurs, rubs, or gallops.  ABDOMEN: Soft, nontender, nondistended. Bowel sounds present. No organomegaly or mass.  EXTREMITIES: No pedal edema, cyanosis, or clubbing.  NEUROLOGIC: Cranial nerves II through XII are intact. Muscle strength 5/5 in all extremities Except right leg, movement is limited due to right hip pain. Most pain with hip abduction. Improving slowly. Sensation intact. Gait not checked.  PSYCHIATRIC: The patient is alert and oriented x 3.  SKIN: No obvious rash, lesion, or ulcer.   DATA REVIEW:   CBC  Recent Labs Lab 01/03/16 0521  WBC 9.4  HGB 12.0  HCT 33.7*  PLT 312    Chemistries   Recent Labs Lab 12/28/15 1951  01/01/16 0458  NA 136  < > 137  K 4.3  < > 3.5  CL 100*  < > 105  CO2 23  < > 25  GLUCOSE 106*  < > 120*  BUN 17  < > 13  CREATININE 1.08*  < > 0.69  CALCIUM 9.5  < > 8.1*  AST 49*  --   --   ALT 28  --   --   ALKPHOS 65  --   --   BILITOT 1.0  --   --   < > = values in this interval not displayed.  Cardiac Enzymes  Recent Labs Lab 12/29/15 1213  TROPONINI 0.03*    Microbiology Results  Results for orders placed or performed during the hospital encounter of 12/28/15  Urine culture     Status: Abnormal   Collection Time: 12/28/15  7:51 PM  Result Value Ref Range Status   Specimen Description URINE, RANDOM  Final   Special Requests NONE  Final   Culture >=100,000 COLONIES/mL ENTEROCOCCUS SPECIES (A)  Final   Report Status 12/31/2015 FINAL  Final   Organism ID, Bacteria ENTEROCOCCUS SPECIES (A)  Final      Susceptibility   Enterococcus species - MIC*    AMPICILLIN <=2 SENSITIVE Sensitive     LEVOFLOXACIN 0.5 SENSITIVE Sensitive     NITROFURANTOIN <=16 SENSITIVE Sensitive     VANCOMYCIN 2 SENSITIVE Sensitive     * >=100,000 COLONIES/mL ENTEROCOCCUS SPECIES  Culture, blood (Routine X 2) w Reflex to ID Panel     Status: None (Preliminary result)   Collection Time: 12/29/15 12:25 AM   Result Value Ref Range Status   Specimen Description BLOOD LEFT HAND  Final   Special Requests   Final    BOTTLES DRAWN AEROBIC AND ANAEROBIC  4CCAERO, 4CCANA   Culture NO GROWTH 4 DAYS  Final   Report Status PENDING  Incomplete  Culture, blood (Routine X 2) w Reflex to ID Panel     Status: None (Preliminary result)   Collection Time: 12/29/15 12:25 AM  Result Value Ref Range Status   Specimen Description BLOOD RIGHT WRIST  Final   Special Requests   Final    BOTTLES DRAWN AEROBIC AND ANAEROBIC  6CCAERO, 3CCANA   Culture NO GROWTH 4 DAYS  Final   Report Status PENDING  Incomplete    RADIOLOGY:  Nm Bone W/spect  01/02/2016  CLINICAL DATA:  Right hip pain. Previous of bilateral hip arthroplasty. EXAM: NM BONE SCAN AND SPECT IMAGING TECHNIQUE: After intravenous injection of radiopharmaceutical, delayed planar images were obtained in multiple projections. Additionally, delayed triplanar SPECT images were obtained through the area of interest. RADIOPHARMACEUTICALS:  22.5 mCi Tc-4130m MDP COMPARISON:  12/30/2015 FINDINGS: On the flow phase portion of the examination there is no abnormal asymmetric increased radiotracer uptake. On the blood pool phase portion of the exam no abnormal asymmetric increased radiotracer uptake is identified. On the 3 hour delayed images there is moderate asymmetric increased uptake localizing to the greater trochanter of the proximal right femur. Mild increased uptake is identified throughout the trochanteric portions of the left proximal femur. Degenerative type changes are noted within the lumbar spine. IMPRESSION: 1. There is no abnormal uptake scratch set the specific findings of arthroplasty device loosening are not identified. 2. On the delayed phase imaging only there is asymmetric increased uptake localizing to the greater trochanter of the proximal right femur. Electronically Signed   By: Signa Kellaylor  Stroud M.D.   On: 01/02/2016 15:14    EKG:   Orders placed or  performed during the hospital encounter of 12/28/15  . ED EKG  . ED EKG  . EKG 12-Lead  . EKG 12-Lead  . EKG 12-Lead  . EKG 12-Lead      Management plans discussed with the patient, family and they are in agreement.  CODE STATUS:     Code Status Orders        Start     Ordered   12/29/15 0107  Full code   Continuous     12/29/15 0106    Code Status History    Date Active Date Inactive Code Status Order ID Comments User Context   07/04/2015  6:48 PM 07/07/2015  9:24 PM Full Code [id number]  Alford Highlandichard Wieting, MD ED      TOTAL TIME TAKING CARE OF THIS PATIENT: 37 minutes.    Enid BaasKALISETTI,Adria Costley M.D on 01/03/2016 at 7:53 AM  Between 7am to 6pm - Pager - [phone]  After 6pm go to [email or web address] - password [email or web address] [facility]  [facility]  Office  [phone]  CC: Primary care physician; [facility]

## 2016-01-03 NOTE — Progress Notes (Signed)
Patient complains of pain. Patient falling asleep during medication administration. Patient name was called to arouse. Medication given see mar. Will continue to monitor.

## 2016-01-03 NOTE — Care Management Important Message (Signed)
Important Message  Patient Details  Name: Elizabeth Shepard MRN: [id number] Date of Birth: May 21, 1941   Medicare Important Message Given:  Yes    Olegario MessierKathy A Kristjan Derner 01/03/2016, 10:09 AM

## 2016-01-03 NOTE — Progress Notes (Signed)
Patient refuses to get OOB and use BSC.Patient states" you are harassing me and I'm tired and in pain. Patient medicated at proper times when medication due. Education given to patient to try and do her best. Patient states" leave that to rehab they will deal with it".

## 2016-01-19 ENCOUNTER — Encounter: Payer: Self-pay | Admitting: Urology

## 2016-01-19 ENCOUNTER — Ambulatory Visit (INDEPENDENT_AMBULATORY_CARE_PROVIDER_SITE_OTHER): Payer: Medicare Other | Admitting: Urology

## 2016-01-19 VITALS — BP 143/72 | HR 79 | Ht 64.0 in | Wt 178.0 lb

## 2016-01-19 DIAGNOSIS — N952 Postmenopausal atrophic vaginitis: Secondary | ICD-10-CM

## 2016-01-19 DIAGNOSIS — N393 Stress incontinence (female) (male): Secondary | ICD-10-CM | POA: Diagnosis not present

## 2016-01-19 DIAGNOSIS — N390 Urinary tract infection, site not specified: Secondary | ICD-10-CM | POA: Diagnosis not present

## 2016-01-19 DIAGNOSIS — R350 Frequency of micturition: Secondary | ICD-10-CM

## 2016-01-19 NOTE — Progress Notes (Signed)
01/19/2016 5:55 PM   Elizabeth Shepard 07-20-1940 [id number]  Referring provider: Raynelle Bring [address] [place], [place] [address]  Chief Complaint  Patient presents with  . Recurrent UTI    hospital follow up    HPI:  75 yo F with recurrent UTIs over the past ~1.5 years.  She was visiting her husband when she fell on 01/01/16 dx with hip contusion.  She was admitted and found to have an incidental asymptomatic "UTI" for with pansensitive Entercoccus which she was adequately treated.  Follow up UCx negative.    Today, she denies any dysuria, blood in the urine.  No fevers/ chills.  She does have baseline frequency. No fevers or chills. She requests to be catheterized today for a urinalysis/urine culture to rule out infection although she is completely asymptomatic otherwise. .   She is using estrogen cream twice weekly x many years.  She had stopped it for a period of time just before she started developing recurrent UTIs but has since resumed this medication.  She uses vaginal applicator 1.5 g.  She is on cranberry tabs.   She does have some mild baseline SUI.  Adequate bladder emptying in the past.    She continues to have urinary frequency q 2 hours including night time which is stable for years.  She was started  on oxybutynin the hospital and she does not want to be on this medication.  She is quite upset that this was started without her knowledge and without discussion.  She does have issues with falling and confusion and does not do well with Benadryl or other anticholinergics.  RUS 09/26/15 negative.    PMH: Past Medical History:  Diagnosis Date  . Depression   . Generalized pain   . Hypercholesteremia   . Hypertension   . Hypothyroidism   . Thyroid disease   . UTI (lower urinary tract infection)     Surgical History: Past Surgical History:  Procedure Laterality Date  . AUGMENTATION MAMMAPLASTY Bilateral 1980  . BREAST BIOPSY Right 1970   neg  .  COLONOSCOPY WITH PROPOFOL N/A 09/12/2015   Procedure: COLONOSCOPY WITH PROPOFOL;  Surgeon: Christena Deem, MD;  Location: [facility] ENDOSCOPY;  Service: Endoscopy;  Laterality: N/A;  . ESOPHAGOGASTRODUODENOSCOPY N/A 07/06/2015   Procedure: ESOPHAGOGASTRODUODENOSCOPY (EGD);  Surgeon: Christena Deem, MD;  Location: [facility] ENDOSCOPY;  Service: Endoscopy;  Laterality: N/A;  . ESOPHAGOGASTRODUODENOSCOPY (EGD) WITH PROPOFOL N/A 09/12/2015   Procedure: ESOPHAGOGASTRODUODENOSCOPY (EGD) WITH PROPOFOL;  Surgeon: Christena Deem, MD;  Location: [facility] ENDOSCOPY;  Service: Endoscopy;  Laterality: N/A;  . HIP SURGERY    . Mastoid surgery    . TONSILLECTOMY    . TOTAL HIP ARTHROPLASTY Bilateral     Home Medications:    Medication List       Accurate as of 01/19/16  5:55 PM. Always use your most recent med list.          cetirizine 10 MG tablet Commonly known as:  ZYRTEC Take 10 mg by mouth daily as needed for allergies.   conjugated estrogens vaginal cream Commonly known as:  PREMARIN Place 1 Applicatorful vaginally every 3 (three) days.   Cranberry 1000 MG Caps Take 1,000 mg by mouth daily.   docusate sodium 100 MG capsule Commonly known as:  COLACE Take 1 capsule (100 mg total) by mouth 2 (two) times daily.   ferrous sulfate 325 (65 FE) MG tablet Take 1 tablet (325 mg total) by mouth daily.  levothyroxine 25 MCG tablet Commonly known as:  SYNTHROID, LEVOTHROID Take 25 mcg by mouth daily before breakfast.   lidocaine 5 % Commonly known as:  LIDODERM Place 1 patch onto the skin daily. Remove & Discard patch within 12 hours or as directed by MD Apply to right hip   methocarbamol 500 MG tablet Commonly known as:  ROBAXIN Take 1 tablet (500 mg total) by mouth 3 (three) times daily. X 5 days   multivitamin with minerals Tabs tablet Take 1 tablet by mouth daily.   oxybutynin 5 MG 24 hr tablet Commonly known as:  DITROPAN-XL Take 1 tablet (5 mg total) by mouth at bedtime.     pantoprazole 40 MG tablet Commonly known as:  PROTONIX Take 1 tablet (40 mg total) by mouth 2 (two) times daily before a meal.   senna 8.6 MG Tabs tablet Commonly known as:  SENOKOT Take 1 tablet (8.6 mg total) by mouth daily.   sertraline 100 MG tablet Commonly known as:  ZOLOFT Take 100 mg by mouth at bedtime.   sucralfate 1 g tablet Commonly known as:  CARAFATE Take 1 g by mouth 2 (two) times daily.       Allergies:  Allergies  Allergen Reactions  . Ciprofloxacin Itching and Rash    Family History: Family History  Problem Relation Age of Onset  . CAD Mother   . Diabetes Mother   . CAD Father   . Diabetes Father   . Bladder Cancer Neg Hx   . Prostate cancer Neg Hx   . Kidney cancer Neg Hx     Social History:  reports that she has never smoked. She has never used smokeless tobacco. She reports that she does not drink alcohol or use drugs.  ROS: UROLOGY Frequent Urination?: Yes Hard to postpone urination?: No Burning/pain with urination?: No Get up at night to urinate?: Yes Leakage of urine?: No Urine stream starts and stops?: Yes Trouble starting stream?: Yes Do you have to strain to urinate?: No Blood in urine?: No Urinary tract infection?: No Sexually transmitted disease?: No Injury to kidneys or bladder?: No Painful intercourse?: No Weak stream?: Yes Currently pregnant?: No Vaginal bleeding?: No Last menstrual period?: n  Gastrointestinal Nausea?: No Vomiting?: No Indigestion/heartburn?: No Diarrhea?: No Constipation?: No  Constitutional Fever: No Night sweats?: No Weight loss?: No Fatigue?: Yes  Skin Skin rash/lesions?: No Itching?: No  Eyes Blurred vision?: No Double vision?: No  Ears/Nose/Throat Sore throat?: No Sinus problems?: No  Hematologic/Lymphatic Swollen glands?: No Easy bruising?: No  Cardiovascular Leg swelling?: Yes Chest pain?: No  Respiratory Cough?: No Shortness of breath?: No  Endocrine Excessive  thirst?: No  Musculoskeletal Back pain?: Yes Joint pain?: Yes  Neurological Headaches?: No Dizziness?: No  Psychologic Depression?: Yes Anxiety?: Yes  Physical Exam: BP (!) 143/72   Pulse 79   Ht 5\' 4"  (1.626 m)   Wt 178 lb (80.7 kg)   BMI 30.55 kg/m   Constitutional:  Alert and oriented, No acute distress. In wheel chair today. HEENT: [place] AT, moist mucus membranes.  Trachea midline, no masses. Cardiovascular: No clubbing, cyanosis, or edema. Respiratory: Normal respiratory effort, no increased work of breathing.  Skin: No rashes, bruises or suspicious lesions. Neurologic: Grossly intact, no focal deficits, moving all 4 extremities. Psychiatric: Normal mood and affect.  Laboratory Data: Lab Results  Component Value Date   WBC 9.4 01/03/2016   HGB 12.0 01/03/2016   HCT 33.7 (L) 01/03/2016   MCV 94.2 01/03/2016  PLT 312 01/03/2016    Lab Results  Component Value Date   CREATININE 0.69 01/01/2016    Urinalysis Asymptomatic, not indicated today  Pertinent Imaging: n/a  Assessment & Plan:    1. Recurrent UTI Suspect bacterial colonization rather than true infection in absence of significant symptoms Asymptomatic today Desires catheterized specimen today- discussed that not indicated given recent negative culture and lack of smyptoms Reviewed abx stewardship again today Continue cranberry tabs/ probiotic/ estrogen cream  Advised to call if develops signs/ symptoms of infection  2. Atrophic vaginitis Continue topical estrogen cream.  3. Stress incontinence of urine Mild. Encouraged Kegel exercises.  4. OAB Stop oxybutynin Mirabegron 25 mg staples x 2 week given for OAB symptoms- does not do well with anticholergics   Return in about 3 months (around 04/20/2016) for recheck symptoms.  Vanna Scotland, MD  [facility] [address] [place], [place] [address] [phone]

## 2016-02-16 ENCOUNTER — Other Ambulatory Visit: Payer: Self-pay | Admitting: Internal Medicine

## 2016-02-16 DIAGNOSIS — R748 Abnormal levels of other serum enzymes: Secondary | ICD-10-CM

## 2016-03-12 ENCOUNTER — Ambulatory Visit
Admission: RE | Admit: 2016-03-12 | Discharge: 2016-03-12 | Disposition: A | Discharge status: Home / Self Care | Payer: Medicare Other | Source: Ambulatory Visit | Attending: Internal Medicine | Admitting: Internal Medicine

## 2016-03-12 DIAGNOSIS — R748 Abnormal levels of other serum enzymes: Secondary | ICD-10-CM | POA: Diagnosis not present

## 2016-04-19 ENCOUNTER — Ambulatory Visit: Payer: Medicare Other | Admitting: Urology

## 2016-08-13 ENCOUNTER — Other Ambulatory Visit: Payer: Self-pay | Admitting: Family Medicine

## 2016-08-13 DIAGNOSIS — Z1231 Encounter for screening mammogram for malignant neoplasm of breast: Secondary | ICD-10-CM

## 2016-08-13 DIAGNOSIS — Z13820 Encounter for screening for osteoporosis: Secondary | ICD-10-CM

## 2016-08-20 IMAGING — US US RENAL
1 series · 14 of 25 positions shown · non-contrast
Comparison: None in PACs

CLINICAL DATA: Recurrent urinary tract infections.

EXAM:
RENAL / URINARY TRACT ULTRASOUND COMPLETE

[Series 1: us renal · 0.25mm/px · 14 of 74 slices shown]
[im 1/74]
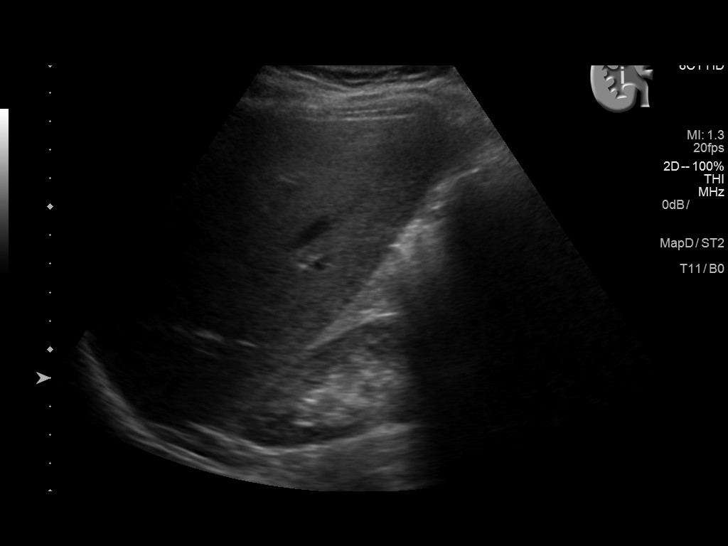
[im 7/74]
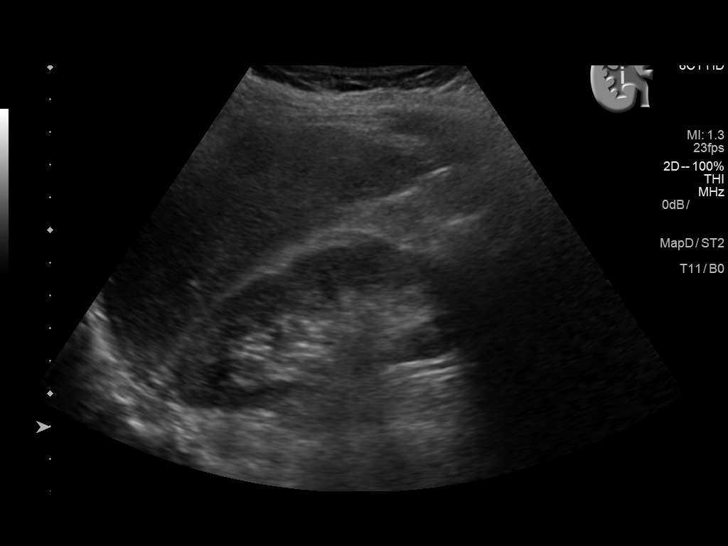
[im 13/74]
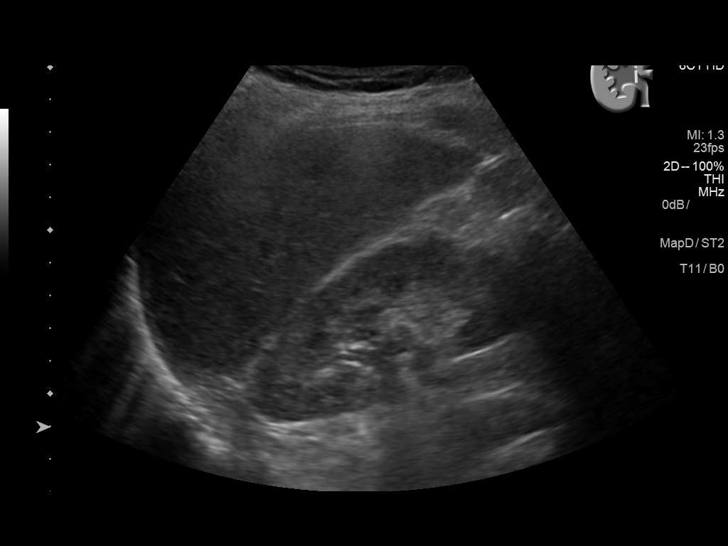
[im 19/74]
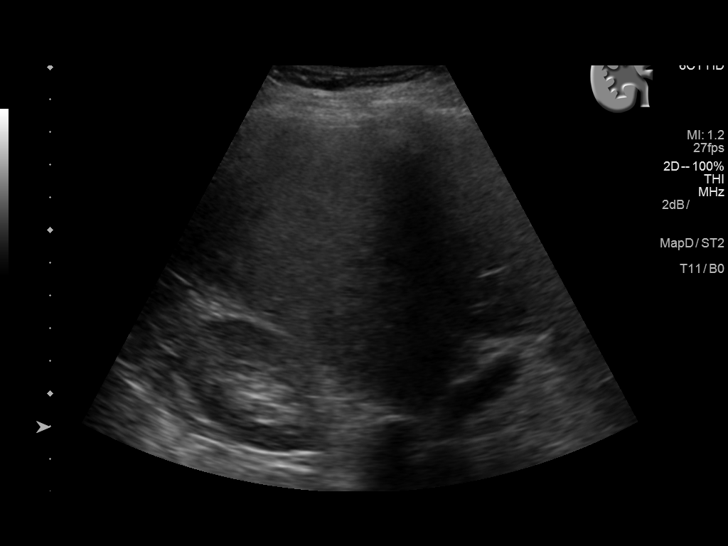
[im 25/74]
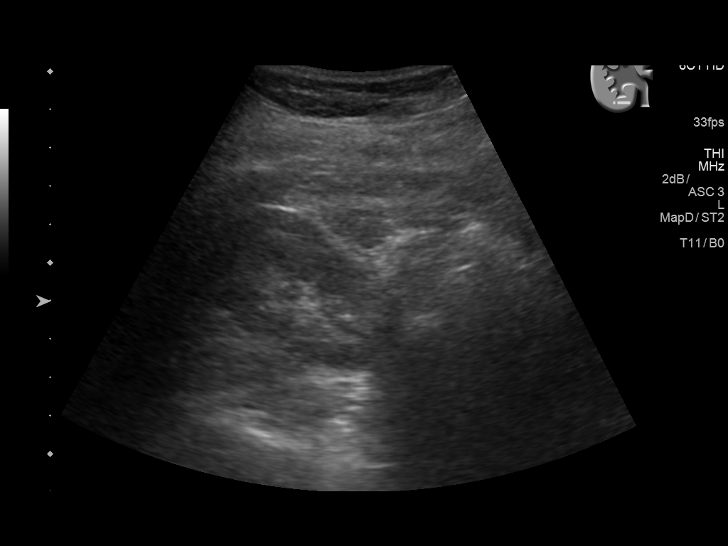
[im 28/74]
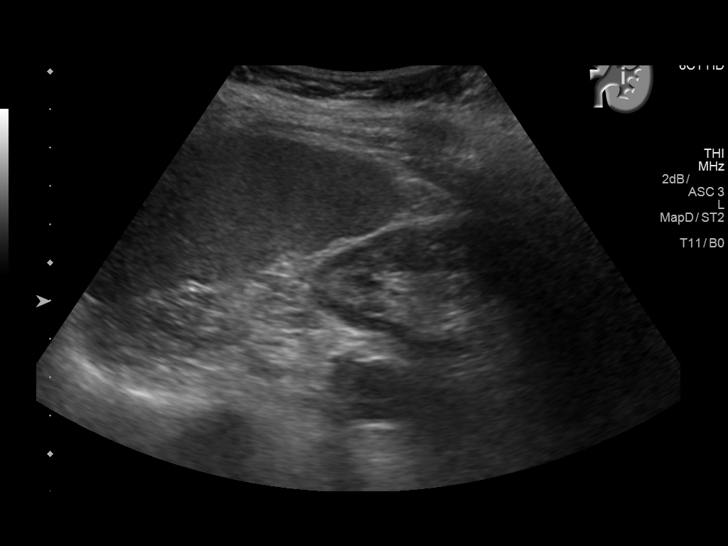
[im 34/74]
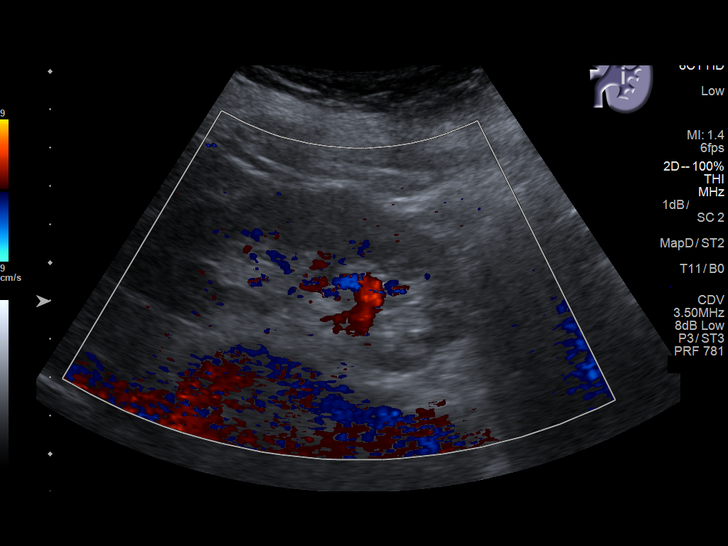
[im 40/74]
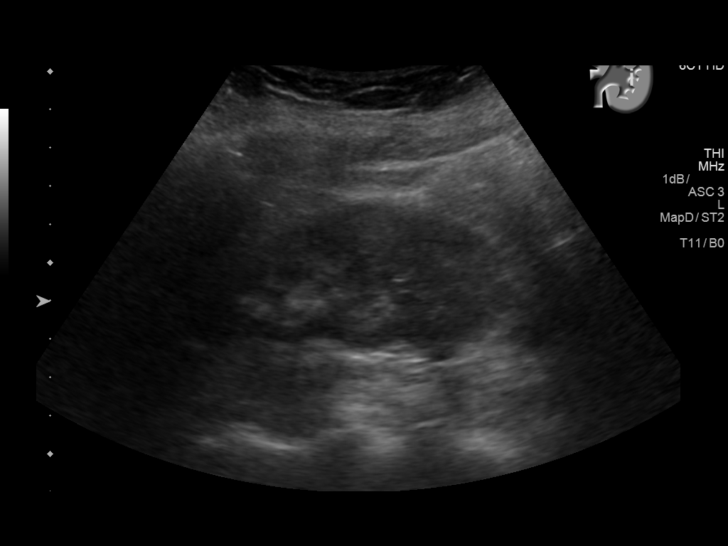
[im 46/74]
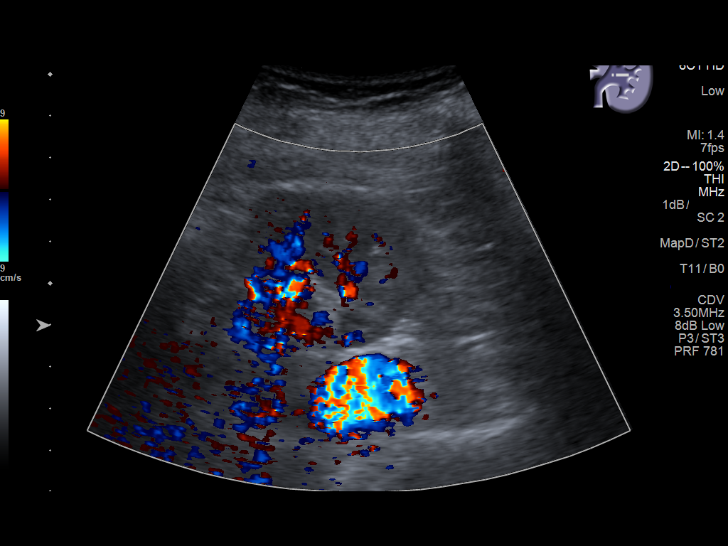
[im 49/74]
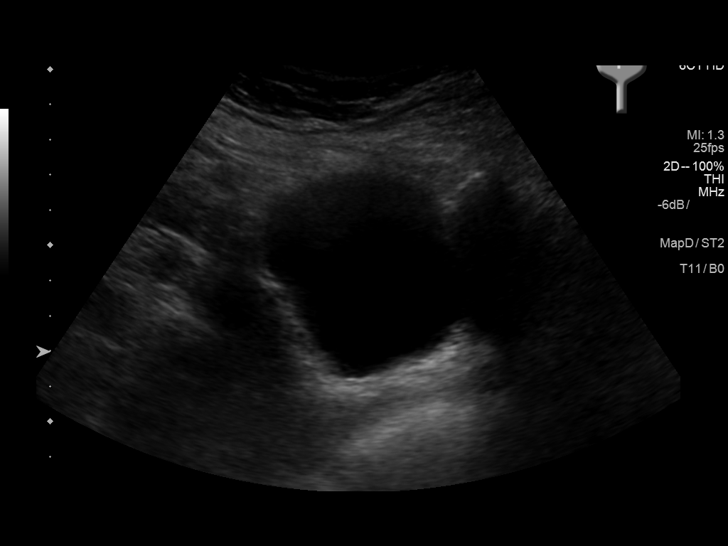
[im 55/74]
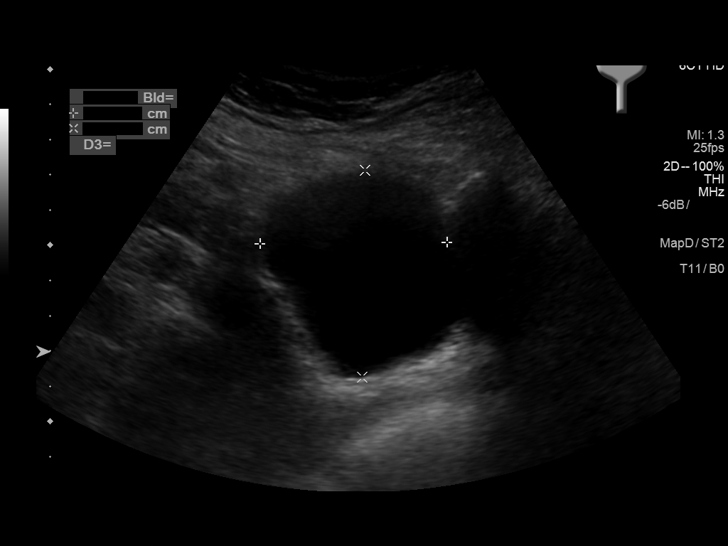
[im 61/74]
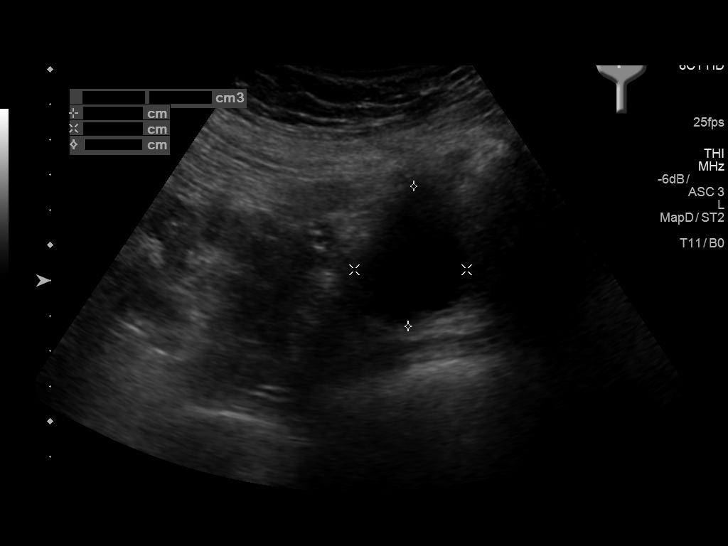
[im 67/74]
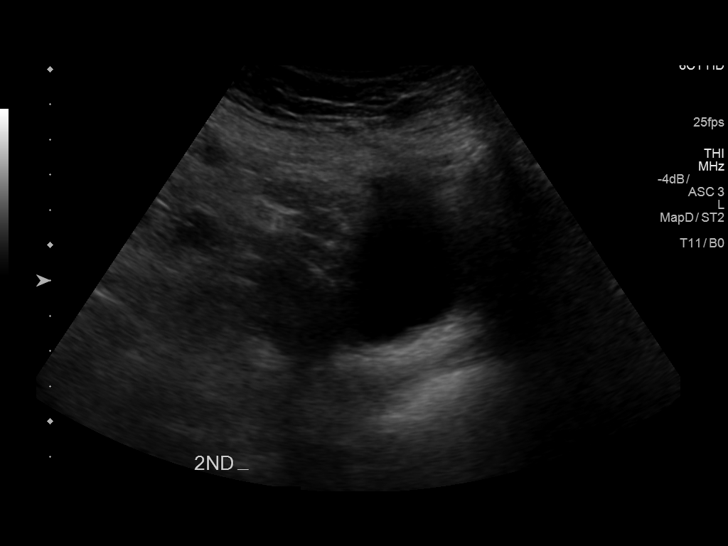
[im 74/74]
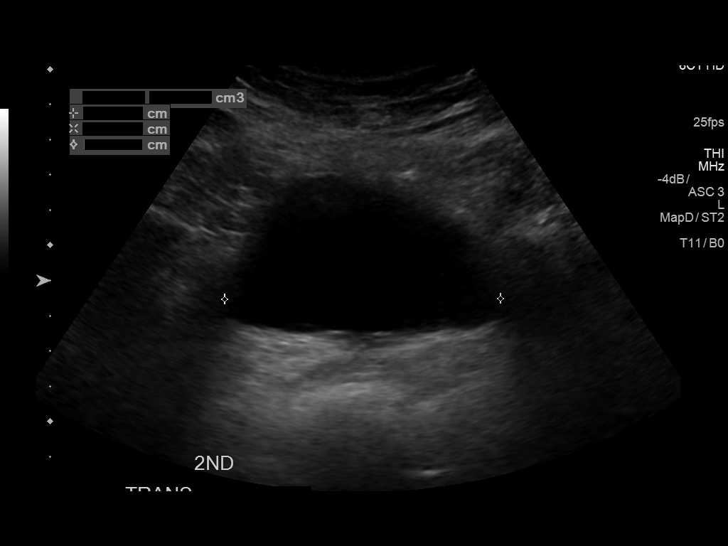

[14 of 25 positions shown; findings below may reference images not displayed]

FINDINGS: Right Kidney:

Length: 9.8 cm. Echogenicity within normal limits. No mass or
hydronephrosis visualized.

Left Kidney:

Length: 9.3 cm. Echogenicity within normal limits. No mass or
hydronephrosis visualized.

Bladder:

The urinary bladder is normal in contour. Ureteral jets are observed
bilaterally. The prevoid volume with 174 cc. The postvoid volume is
approximately 60 cc.
IMPRESSION: Normal appearance of the kidneys for age. There is no
hydronephrosis. Moderate-sized residual urinary bladder volume of 60
cc.

## 2016-10-22 ENCOUNTER — Ambulatory Visit: Payer: Medicare Other

## 2016-10-22 ENCOUNTER — Other Ambulatory Visit: Payer: Medicare Other

## 2016-10-29 ENCOUNTER — Ambulatory Visit
Admission: RE | Admit: 2016-10-29 | Discharge: 2016-10-29 | Disposition: A | Discharge status: Home / Self Care | Payer: Medicare Other | Source: Ambulatory Visit | Attending: Family Medicine | Admitting: Family Medicine

## 2016-10-29 DIAGNOSIS — Z780 Asymptomatic menopausal state: Secondary | ICD-10-CM | POA: Diagnosis not present

## 2016-10-29 DIAGNOSIS — Z96643 Presence of artificial hip joint, bilateral: Secondary | ICD-10-CM | POA: Diagnosis not present

## 2016-10-29 DIAGNOSIS — Z9882 Breast implant status: Secondary | ICD-10-CM | POA: Diagnosis not present

## 2016-10-29 DIAGNOSIS — Z13820 Encounter for screening for osteoporosis: Secondary | ICD-10-CM

## 2016-10-29 DIAGNOSIS — Z1231 Encounter for screening mammogram for malignant neoplasm of breast: Secondary | ICD-10-CM | POA: Diagnosis not present

## 2017-06-20 ENCOUNTER — Emergency Department
Admission: EM | Admit: 2017-06-20 | Discharge: 2017-06-20 | Disposition: A | Discharge status: Home / Self Care | Payer: Medicare Other | Attending: Emergency Medicine | Admitting: Emergency Medicine

## 2017-06-20 ENCOUNTER — Other Ambulatory Visit: Payer: Self-pay

## 2017-06-20 ENCOUNTER — Encounter: Payer: Self-pay | Admitting: Emergency Medicine

## 2017-06-20 ENCOUNTER — Emergency Department: Payer: Medicare Other

## 2017-06-20 DIAGNOSIS — I10 Essential (primary) hypertension: Secondary | ICD-10-CM | POA: Diagnosis not present

## 2017-06-20 DIAGNOSIS — R600 Localized edema: Secondary | ICD-10-CM | POA: Insufficient documentation

## 2017-06-20 DIAGNOSIS — F329 Major depressive disorder, single episode, unspecified: Secondary | ICD-10-CM | POA: Insufficient documentation

## 2017-06-20 DIAGNOSIS — Z96643 Presence of artificial hip joint, bilateral: Secondary | ICD-10-CM | POA: Diagnosis not present

## 2017-06-20 DIAGNOSIS — Z79899 Other long term (current) drug therapy: Secondary | ICD-10-CM | POA: Diagnosis not present

## 2017-06-20 DIAGNOSIS — R609 Edema, unspecified: Secondary | ICD-10-CM

## 2017-06-20 DIAGNOSIS — E039 Hypothyroidism, unspecified: Secondary | ICD-10-CM | POA: Insufficient documentation

## 2017-06-20 DIAGNOSIS — R2241 Localized swelling, mass and lump, right lower limb: Secondary | ICD-10-CM | POA: Diagnosis present

## 2017-06-20 NOTE — ED Provider Notes (Signed)
[facility] Emergency Department Provider Note  ____________________________________________  Time seen: Approximately 8:41 PM  I have reviewed the triage vital signs and the nursing notes.   HISTORY  Chief Complaint Leg Swelling    HPI Elizabeth Shepard is a 77 y.o. female sent to the ED by her primary care doctor for evaluation of possible blood clot in the right leg. Patient reports that she has chronic peripheral edema, but it's been worse over the past few days since returning from a car trip to the beach where she drove 3 hours each way with her sons. She denies any chest pain or shortness of breath. No leg pain but does note that her right leg is swollen more than the left. Denies history of blood clot. No recent injuries or surgeries. No fevers or chills.     Past Medical History:  Diagnosis Date  . Depression   . Generalized pain   . Hypercholesteremia   . Hypertension   . Hypothyroidism   . Thyroid disease   . UTI (lower urinary tract infection)      Patient Active Problem List   Diagnosis Date Noted  . Sepsis (HCC) 12/30/2015  . UTI (lower urinary tract infection) 12/29/2015  . Hypothyroidism 12/29/2015  . Weakness 12/29/2015  . Abnormal EKG 12/29/2015  . Allergic rhinitis 09/20/2015  . Clinical depression 09/20/2015  . HLD (hyperlipidemia) 09/20/2015  . BP (high blood pressure) 09/20/2015  . Anemia 07/04/2015  . Acute hemorrhagic cystitis 11/07/2014     Past Surgical History:  Procedure Laterality Date  . AUGMENTATION MAMMAPLASTY Bilateral 1980  . BREAST BIOPSY Right 1970   neg  . COLONOSCOPY WITH PROPOFOL N/A 09/12/2015   Procedure: COLONOSCOPY WITH PROPOFOL;  Surgeon: Christena DeemMartin U Skulskie, MD;  [facility];  Service: Endoscopy;  Laterality: N/A;  . ESOPHAGOGASTRODUODENOSCOPY N/A 07/06/2015   Procedure: ESOPHAGOGASTRODUODENOSCOPY (EGD);  Surgeon: Christena DeemMartin U Skulskie, MD;  [facility];  Service: Endoscopy;   Laterality: N/A;  . ESOPHAGOGASTRODUODENOSCOPY (EGD) WITH PROPOFOL N/A 09/12/2015   Procedure: ESOPHAGOGASTRODUODENOSCOPY (EGD) WITH PROPOFOL;  Surgeon: Christena DeemMartin U Skulskie, MD;  [facility];  Service: Endoscopy;  Laterality: N/A;  . HIP SURGERY    . Mastoid surgery    . TONSILLECTOMY    . TOTAL HIP ARTHROPLASTY Bilateral      Prior to Admission medications   Medication Sig Start Date End Date Taking? Authorizing Provider  cetirizine (ZYRTEC) 10 MG tablet Take 10 mg by mouth daily as needed for allergies.    [provider]  conjugated estrogens (PREMARIN) vaginal cream Place 1 Applicatorful vaginally every 3 (three) days.    [provider]  Cranberry 1000 MG CAPS Take 1,000 mg by mouth daily.    [provider]  docusate sodium (COLACE) 100 MG capsule Take 1 capsule (100 mg total) by mouth 2 (two) times daily. 01/02/16   Enid BaasKalisetti, Radhika, MD  ferrous sulfate 325 (65 FE) MG tablet Take 1 tablet (325 mg total) by mouth daily. 01/02/16   Enid BaasKalisetti, Radhika, MD  levothyroxine (SYNTHROID, LEVOTHROID) 25 MCG tablet Take 25 mcg by mouth daily before breakfast.    [provider]  lidocaine (LIDODERM) 5 % Place 1 patch onto the skin daily. Remove & Discard patch within 12 hours or as directed by MD Apply to right hip 01/02/16   Enid BaasKalisetti, Radhika, MD  methocarbamol (ROBAXIN) 500 MG tablet Take 1 tablet (500 mg total) by mouth 3 (three) times daily. X 5 days 01/03/16   Nemiah CommanderKalisetti,  Donnita [place], MD  Multiple Vitamin (MULTIVITAMIN WITH MINERALS) TABS tablet Take 1 tablet by mouth daily.    [provider]  oxybutynin (DITROPAN-XL) 5 MG 24 hr tablet Take 1 tablet (5 mg total) by mouth at bedtime. 01/03/16   Enid Baas, MD  pantoprazole (PROTONIX) 40 MG tablet Take 1 tablet (40 mg total) by mouth 2 (two) times daily before a meal. Patient taking differently: Take 40 mg by mouth daily.  07/07/15   Shaune Pollack, MD  senna (SENOKOT) 8.6 MG TABS tablet  Take 1 tablet (8.6 mg total) by mouth daily. 01/02/16   Enid Baas, MD  sertraline (ZOLOFT) 100 MG tablet Take 100 mg by mouth at bedtime.    [provider]  sucralfate (CARAFATE) 1 g tablet Take 1 g by mouth 2 (two) times daily.    [provider]     Allergies Ciprofloxacin   Family History  Problem Relation Age of Onset  . CAD Mother   . Diabetes Mother   . CAD Father   . Diabetes Father   . Bladder Cancer Neg Hx   . Prostate cancer Neg Hx   . Kidney cancer Neg Hx     Social History Social History   Tobacco Use  . Smoking status: Never Smoker  . Smokeless tobacco: Never Used  Substance Use Topics  . Alcohol use: No  . Drug use: No    Review of Systems  Constitutional:   No fever or chills.  ENT:   No sore throat. No rhinorrhea. Cardiovascular:   No chest pain or syncope. Respiratory:   No dyspnea or cough. Gastrointestinal:   Negative for abdominal pain, vomiting and diarrhea.  Musculoskeletal:   Bilateral lower leg swelling, right greater than left All other systems reviewed and are negative except as documented above in ROS and HPI.  ____________________________________________   PHYSICAL EXAM:  VITAL SIGNS: ED Triage Vitals  Enc Vitals Group     BP 06/20/17 1748 (!) 142/74     Pulse Rate 06/20/17 1748 73     Resp 06/20/17 1748 16     Temp 06/20/17 1748 97.8 F (36.6 C)     Temp Source 06/20/17 1748 Oral     SpO2 06/20/17 1748 94 %     Weight 06/20/17 1749 172 lb (78 kg)     Height 06/20/17 1749 5\' 4"  (1.626 m)     Head Circumference --      Peak Flow --      Pain Score 06/20/17 1751 0     Pain Loc --      Pain Edu? --      Excl. in GC? --     Vital signs reviewed, nursing assessments reviewed.   Constitutional:   Alert and oriented. Well appearing and in no distress. Eyes:   No scleral icterus.  EOMI. [facility]   Head:   Normocephalic and atraumatic.   Nose:   No congestion/rhinnorhea.      Neck:   No  meningismus. Full ROM. Thyroid nonpalpable Hematological/Lymphatic/Immunilogical:   No cervical lymphadenopathy. Cardiovascular:   RRR. Symmetric bilateral radial and DP pulses.  No murmurs.  Respiratory:   Normal respiratory effort without tachypnea/retractions. Breath sounds are clear and equal bilaterally. No wheezes/rales/rhonchi. Gastrointestinal:   Soft and nontender. Non distended. There is no CVA tenderness.  No rebound, rigidity, or guarding. Genitourinary:   deferred Musculoskeletal:   Normal range of motion in all extremities. No joint effusions.  No lower extremity tenderness.  2+ pitting edema bilateral lower extremities, slightly greater on the right. Right calf circumference about 1 cm greater than the left. No soft tissue tenderness palpable cords. Negative Homans sign. No erythema or streaking. Multiple varicose veins Neurologic:   Normal speech and language.  Motor grossly intact. No gross focal neurologic deficits are appreciated.  Skin:    Skin is warm, dry and intact. No rash noted.  No petechiae, purpura, or bullae.  ____________________________________________    LABS (pertinent positives/negatives) (all labs ordered are listed, but only abnormal results are displayed) Labs Reviewed - No data to display ____________________________________________   EKG    ____________________________________________    RADIOLOGY  US Venous Img Lower Unilateral Right  Result Date: 06/20/2017 CLINICAL DATA:  Swelling and pain in RIGHT lower extremity for 1 week EXAM: RIGHT LOWER EXTREMITY VENOUS DOPPLER ULTRASOUND TECHNIQUE: Gray-scale sonography with graded compression, as well as color Doppler and duplex ultrasound were performed to evaluate the lower extremity deep venous systems from the level of the common femoral vein and including the common femoral, femoral, profunda femoral, popliteal and calf veins including the posterior tibial, peroneal and gastrocnemius veins when  visible. The superficial great saphenous vein was also interrogated. Spectral Doppler was utilized to evaluate flow at rest and with distal augmentation maneuvers in the common femoral, femoral and popliteal veins. COMPARISON:  None. FINDINGS: Contralateral Common Femoral Vein: Respiratory phasicity is normal and symmetric with the symptomatic side. No evidence of thrombus. Normal compressibility. Common Femoral Vein: No evidence of thrombus. Normal compressibility, respiratory phasicity and response to augmentation. Saphenofemoral Junction: No evidence of thrombus. Normal compressibility and flow on color Doppler imaging. Profunda Femoral Vein: No evidence of thrombus. Normal compressibility and flow on color Doppler imaging. Femoral Vein: No evidence of thrombus. Normal compressibility, respiratory phasicity and response to augmentation. Popliteal Vein: No evidence of thrombus. Normal compressibility, respiratory phasicity and response to augmentation. Calf Veins: No evidence of thrombus. Normal compressibility and flow on color Doppler imaging. Superficial Great Saphenous Vein: No evidence of thrombus. Normal compressibility. Venous Reflux:  None. Other Findings:  None. IMPRESSION: No evidence of deep venous thrombosis in the RIGHT lower extremity. Electronically Signed   By: Ulyses Southward M.D.   On: 06/20/2017 18:39    ____________________________________________   PROCEDURES Procedures  ____________________________________________    CLINICAL IMPRESSION / ASSESSMENT AND PLAN / ED COURSE  Pertinent labs & imaging results that were available during my care of the patient were reviewed by me and considered in my medical decision making (see chart for details).   Patient well appearing no acute distress, presents with bilateral lower leg swelling consistent with peripheral edema. Low suspicion for ACS or CHF, likely dependent edema. Presentation is not consistent with soft tissue infection or  cellulitis. Low suspicion for DVT, and ultrasound is negative for DVT. Patient will be discharged home to follow up with her primary care doctor. Counseled on leg elevation and compression stockings which she does have at home. Vital signs unremarkable, patient well appearing and nontoxic.      ____________________________________________   FINAL CLINICAL IMPRESSION(S) / ED DIAGNOSES    Final diagnoses:  Peripheral edema       Portions of this note were generated with dragon dictation software. Dictation errors may occur despite best attempts at proofreading.    Sharman Cheek, MD 06/20/17 2044

## 2017-06-20 NOTE — ED Notes (Signed)
Pt left after MD talked to her. PT was not in the room upon discharge with RN. PT left without papers and without signing for discharge or registration. Pt was ambulatory and per last time this RN assessed pt in NAD.

## 2017-06-20 NOTE — ED Triage Notes (Signed)
FIRST NURSE NOTE-SENT FROM [facility] FOR R/O DVT

## 2017-06-20 NOTE — ED Notes (Signed)
Has had US, waiting on results and room.

## 2017-06-20 NOTE — ED Triage Notes (Addendum)
Pt to ED c/o right leg swelling x1 week with some pain and redness.  No noticeable warmth to right leg.  Denies SOB.  Took tramadol at home at 1700 today.

## 2017-06-20 NOTE — Discharge Instructions (Signed)
Your ultrasound today did not show any blood clots in the leg.    [place] Venous Img Lower Unilateral Right  Result Date: 06/20/2017 CLINICAL DATA:  Swelling and pain in RIGHT lower extremity for 1 week EXAM: RIGHT LOWER EXTREMITY VENOUS DOPPLER ULTRASOUND TECHNIQUE: Gray-scale sonography with graded compression, as well as color Doppler and duplex ultrasound were performed to evaluate the lower extremity deep venous systems from the level of the common femoral vein and including the common femoral, femoral, profunda femoral, popliteal and calf veins including the posterior tibial, peroneal and gastrocnemius veins when visible. The superficial great saphenous vein was also interrogated. Spectral Doppler was utilized to evaluate flow at rest and with distal augmentation maneuvers in the common femoral, femoral and popliteal veins. COMPARISON:  None. FINDINGS: Contralateral Common Femoral Vein: Respiratory phasicity is normal and symmetric with the symptomatic side. No evidence of thrombus. Normal compressibility. Common Femoral Vein: No evidence of thrombus. Normal compressibility, respiratory phasicity and response to augmentation. Saphenofemoral Junction: No evidence of thrombus. Normal compressibility and flow on color Doppler imaging. Profunda Femoral Vein: No evidence of thrombus. Normal compressibility and flow on color Doppler imaging. Femoral Vein: No evidence of thrombus. Normal compressibility, respiratory phasicity and response to augmentation. Popliteal Vein: No evidence of thrombus. Normal compressibility, respiratory phasicity and response to augmentation. Calf Veins: No evidence of thrombus. Normal compressibility and flow on color Doppler imaging. Superficial Great Saphenous Vein: No evidence of thrombus. Normal compressibility. Venous Reflux:  None. Other Findings:  None. IMPRESSION: No evidence of deep venous thrombosis in the RIGHT lower extremity. Electronically Signed   By: Ulyses SouthwardMark  Boles M.D.   On:  06/20/2017 18:39

## 2017-06-20 NOTE — ED Notes (Signed)
ED Provider at bedside. 

## 2017-09-11 ENCOUNTER — Emergency Department: Payer: Medicare Other

## 2017-09-11 ENCOUNTER — Encounter: Payer: Self-pay | Admitting: Emergency Medicine

## 2017-09-11 ENCOUNTER — Other Ambulatory Visit: Payer: Self-pay

## 2017-09-11 ENCOUNTER — Observation Stay
Admission: EM | Admit: 2017-09-11 | Discharge: 2017-09-14 | Disposition: A | Discharge status: Home Health | Payer: Medicare Other | Attending: Internal Medicine | Admitting: Internal Medicine

## 2017-09-11 DIAGNOSIS — I10 Essential (primary) hypertension: Secondary | ICD-10-CM | POA: Insufficient documentation

## 2017-09-11 DIAGNOSIS — E785 Hyperlipidemia, unspecified: Secondary | ICD-10-CM | POA: Diagnosis not present

## 2017-09-11 DIAGNOSIS — Z79890 Hormone replacement therapy (postmenopausal): Secondary | ICD-10-CM | POA: Diagnosis not present

## 2017-09-11 DIAGNOSIS — K219 Gastro-esophageal reflux disease without esophagitis: Secondary | ICD-10-CM | POA: Diagnosis not present

## 2017-09-11 DIAGNOSIS — W19XXXA Unspecified fall, initial encounter: Secondary | ICD-10-CM | POA: Diagnosis not present

## 2017-09-11 DIAGNOSIS — S73004A Unspecified dislocation of right hip, initial encounter: Secondary | ICD-10-CM

## 2017-09-11 DIAGNOSIS — E039 Hypothyroidism, unspecified: Secondary | ICD-10-CM | POA: Insufficient documentation

## 2017-09-11 DIAGNOSIS — Z79899 Other long term (current) drug therapy: Secondary | ICD-10-CM | POA: Insufficient documentation

## 2017-09-11 DIAGNOSIS — Z96643 Presence of artificial hip joint, bilateral: Secondary | ICD-10-CM | POA: Insufficient documentation

## 2017-09-11 DIAGNOSIS — S73034A Other anterior dislocation of right hip, initial encounter: Principal | ICD-10-CM | POA: Insufficient documentation

## 2017-09-11 DIAGNOSIS — M25551 Pain in right hip: Secondary | ICD-10-CM | POA: Diagnosis present

## 2017-09-11 LAB — BASIC METABOLIC PANEL
ANION GAP: 14 (ref 5–15)
BUN: 18 mg/dL (ref 6–20)
CALCIUM: 9.7 mg/dL (ref 8.9–10.3)
CO2: 23 mmol/L (ref 22–32)
Chloride: 98 mmol/L — ABNORMAL LOW (ref 101–111)
Creatinine, Ser: 0.98 mg/dL (ref 0.44–1.00)
GFR calc Af Amer: >60 mL/min (ref >60)
GFR, EST NON AFRICAN AMERICAN: 55 mL/min — AB (ref >60)
GLUCOSE: 112 mg/dL — AB (ref 65–99)
POTASSIUM: 3.7 mmol/L (ref 3.5–5.1)
Sodium: 135 mmol/L (ref 135–145)

## 2017-09-11 LAB — CBC WITH DIFFERENTIAL/PLATELET
BASOS ABS: 0 10*3/uL (ref 0–0.1)
Basophils Relative: 0 %
EOS PCT: 0 %
Eosinophils Absolute: 0 10*3/uL (ref 0–0.7)
HEMATOCRIT: 44.9 % (ref 35.0–47.0)
Hemoglobin: 15 g/dL (ref 12.0–16.0)
LYMPHS PCT: 14 %
Lymphs Abs: 1 10*3/uL (ref 1.0–3.6)
MCH: 32.6 pg (ref 26.0–34.0)
MCHC: 33.4 g/dL (ref 32.0–36.0)
MCV: 97.7 fL (ref 80.0–100.0)
Monocytes Absolute: 0.3 10*3/uL (ref 0.2–0.9)
Monocytes Relative: 4 %
NEUTROS ABS: 6.1 10*3/uL (ref 1.4–6.5)
Neutrophils Relative %: 82 %
PLATELETS: 380 10*3/uL (ref 150–440)
RBC: 4.59 MIL/uL (ref 3.80–5.20)
RDW: 12.3 % (ref 11.5–14.5)
WBC: 7.4 10*3/uL (ref 3.6–11.0)

## 2017-09-11 LAB — TYPE AND SCREEN
ABO/RH(D): O POS
ANTIBODY SCREEN: NEGATIVE

## 2017-09-11 MED ORDER — ACETAMINOPHEN 325 MG PO TABS: 650.0000 mg | ORAL_TABLET | Freq: Four times a day (QID) | ORAL | Status: DC | PRN

## 2017-09-11 MED ORDER — ETOMIDATE 2 MG/ML IV SOLN
INTRAVENOUS | Status: AC | PRN
  Administered 2017-09-11: 20 mg via INTRAVENOUS

## 2017-09-11 MED ORDER — ACETAMINOPHEN 650 MG RE SUPP: 650.0000 mg | Freq: Four times a day (QID) | RECTAL | Status: DC | PRN

## 2017-09-11 MED ORDER — ONDANSETRON HCL 4 MG PO TABS: 4.0000 mg | ORAL_TABLET | Freq: Four times a day (QID) | ORAL | Status: DC | PRN

## 2017-09-11 MED ORDER — SERTRALINE HCL 50 MG PO TABS
100.0000 mg | ORAL_TABLET | Freq: Every day | ORAL | Status: DC
  Administered 2017-09-11 – 2017-09-13 (×2): 100 mg via ORAL
  Filled 2017-09-11 (×3): qty 2

## 2017-09-11 MED ORDER — PROPOFOL 10 MG/ML IV BOLUS
0.7500 mg/kg | Freq: Once | INTRAVENOUS | Status: AC
  Administered 2017-09-11: 57.8 mg via INTRAVENOUS
  Filled 2017-09-11: qty 20

## 2017-09-11 MED ORDER — HYDROCODONE-ACETAMINOPHEN 5-325 MG PO TABS
1.0000 | ORAL_TABLET | ORAL | Status: DC | PRN
  Administered 2017-09-11 – 2017-09-14 (×13): 2 via ORAL
  Filled 2017-09-11 (×12): qty 2

## 2017-09-11 MED ORDER — BISACODYL 5 MG PO TBEC
5.0000 mg | DELAYED_RELEASE_TABLET | Freq: Every day | ORAL | Status: DC | PRN
  Administered 2017-09-13 – 2017-09-14 (×2): 5 mg via ORAL
  Filled 2017-09-11 (×3): qty 1

## 2017-09-11 MED ORDER — SUCRALFATE 1 G PO TABS
1.0000 g | ORAL_TABLET | Freq: Two times a day (BID) | ORAL | Status: DC
  Administered 2017-09-11 – 2017-09-14 (×4): 1 g via ORAL
  Filled 2017-09-11 (×5): qty 1

## 2017-09-11 MED ORDER — SODIUM CHLORIDE 0.9% FLUSH: 3.0000 mL | Freq: Two times a day (BID) | INTRAVENOUS | Status: DC

## 2017-09-11 MED ORDER — ENOXAPARIN SODIUM 40 MG/0.4ML ~~LOC~~ SOLN
40.0000 mg | SUBCUTANEOUS | Status: DC
  Administered 2017-09-11 – 2017-09-13 (×3): 40 mg via SUBCUTANEOUS
  Filled 2017-09-11 (×3): qty 0.4

## 2017-09-11 MED ORDER — KETOROLAC TROMETHAMINE 15 MG/ML IJ SOLN
15.0000 mg | Freq: Four times a day (QID) | INTRAMUSCULAR | Status: DC | PRN
  Administered 2017-09-12: 15 mg via INTRAVENOUS
  Filled 2017-09-11: qty 1

## 2017-09-11 MED ORDER — SODIUM CHLORIDE 0.9 % IV SOLN: 250.0000 mL | INTRAVENOUS | Status: DC | PRN

## 2017-09-11 MED ORDER — SODIUM CHLORIDE 0.9% FLUSH: 3.0000 mL | INTRAVENOUS | Status: DC | PRN

## 2017-09-11 MED ORDER — LISINOPRIL 10 MG PO TABS
10.0000 mg | ORAL_TABLET | Freq: Every day | ORAL | Status: DC
  Administered 2017-09-12 – 2017-09-14 (×3): 10 mg via ORAL
  Filled 2017-09-11 (×3): qty 1

## 2017-09-11 MED ORDER — PROPOFOL 10 MG/ML IV BOLUS
INTRAVENOUS | Status: AC | PRN
  Administered 2017-09-11: 40 mg via INTRAVENOUS
  Administered 2017-09-11: 57.8 mg via INTRAVENOUS
  Administered 2017-09-11: 20 mg via INTRAVENOUS

## 2017-09-11 MED ORDER — LEVOTHYROXINE SODIUM 25 MCG PO TABS
25.0000 ug | ORAL_TABLET | Freq: Every day | ORAL | Status: DC
  Administered 2017-09-11 – 2017-09-14 (×3): 25 ug via ORAL
  Filled 2017-09-11 (×4): qty 1

## 2017-09-11 MED ORDER — LORATADINE 10 MG PO TABS
10.0000 mg | ORAL_TABLET | Freq: Every day | ORAL | Status: DC
  Administered 2017-09-11 – 2017-09-14 (×4): 10 mg via ORAL
  Filled 2017-09-11 (×4): qty 1

## 2017-09-11 MED ORDER — FENTANYL CITRATE (PF) 100 MCG/2ML IJ SOLN
50.0000 ug | INTRAMUSCULAR | Status: AC | PRN
  Administered 2017-09-11 – 2017-09-12 (×2): 50 ug via INTRAVENOUS
  Filled 2017-09-11 (×2): qty 2

## 2017-09-11 MED ORDER — ATORVASTATIN CALCIUM 20 MG PO TABS
40.0000 mg | ORAL_TABLET | Freq: Every day | ORAL | Status: DC
  Administered 2017-09-11 – 2017-09-14 (×4): 40 mg via ORAL
  Filled 2017-09-11 (×4): qty 2

## 2017-09-11 MED ORDER — ETOMIDATE 2 MG/ML IV SOLN
INTRAVENOUS | Status: AC
  Filled 2017-09-11: qty 10

## 2017-09-11 MED ORDER — PANTOPRAZOLE SODIUM 40 MG PO TBEC
40.0000 mg | DELAYED_RELEASE_TABLET | Freq: Every day | ORAL | Status: DC
  Administered 2017-09-11 – 2017-09-14 (×4): 40 mg via ORAL
  Filled 2017-09-11 (×4): qty 1

## 2017-09-11 MED ORDER — ALBUTEROL SULFATE (2.5 MG/3ML) 0.083% IN NEBU: 2.5000 mg | INHALATION_SOLUTION | RESPIRATORY_TRACT | Status: DC | PRN

## 2017-09-11 MED ORDER — SENNOSIDES-DOCUSATE SODIUM 8.6-50 MG PO TABS: 1.0000 | ORAL_TABLET | Freq: Every evening | ORAL | Status: DC | PRN

## 2017-09-11 MED ORDER — ONDANSETRON HCL 4 MG/2ML IJ SOLN: 4.0000 mg | Freq: Four times a day (QID) | INTRAMUSCULAR | Status: DC | PRN

## 2017-09-11 NOTE — ED Provider Notes (Signed)
[facility] Emergency Department Provider Note   ____________________________________________    I have reviewed the triage vital signs and the nursing notes.   HISTORY  Chief Complaint Fall and Hip Pain     HPI Elizabeth Shepard is a 77 y.o. female who presents after a fall.  Patient reports she has had back pain over the last week and occasionally has radiating pain down her right leg, this happened to her today and she fell to the ground.  She complains of pain in her right hip when she moves her leg.  She has had hip replacements bilaterally, performed in [place].  Has been taking tramadol for her back pain, denies chest pain or shortness of breath.  No fevers or chills.  Past Medical History:  Diagnosis Date  . Depression   . Generalized pain   . Hypercholesteremia   . Hypertension   . Hypothyroidism   . Thyroid disease   . UTI (lower urinary tract infection)     Patient Active Problem List   Diagnosis Date Noted  . Sepsis (HCC) 12/30/2015  . UTI (lower urinary tract infection) 12/29/2015  . Hypothyroidism 12/29/2015  . Weakness 12/29/2015  . Abnormal EKG 12/29/2015  . Allergic rhinitis 09/20/2015  . Clinical depression 09/20/2015  . HLD (hyperlipidemia) 09/20/2015  . BP (high blood pressure) 09/20/2015  . Anemia 07/04/2015  . Acute hemorrhagic cystitis 11/07/2014    Past Surgical History:  Procedure Laterality Date  . AUGMENTATION MAMMAPLASTY Bilateral 1980  . BREAST BIOPSY Right 1970   neg  . COLONOSCOPY WITH PROPOFOL N/A 09/12/2015   Procedure: COLONOSCOPY WITH PROPOFOL;  Surgeon: Christena Deem, MD;  Location: [facility] ENDOSCOPY;  Service: Endoscopy;  Laterality: N/A;  . ESOPHAGOGASTRODUODENOSCOPY N/A 07/06/2015   Procedure: ESOPHAGOGASTRODUODENOSCOPY (EGD);  Surgeon: Christena Deem, MD;  Location: [facility] ENDOSCOPY;  Service: Endoscopy;  Laterality: N/A;  . ESOPHAGOGASTRODUODENOSCOPY (EGD) WITH PROPOFOL N/A 09/12/2015   Procedure: ESOPHAGOGASTRODUODENOSCOPY (EGD) WITH PROPOFOL;  Surgeon: Christena Deem, MD;  Location: [facility] ENDOSCOPY;  Service: Endoscopy;  Laterality: N/A;  . HIP SURGERY    . Mastoid surgery    . TONSILLECTOMY    . TOTAL HIP ARTHROPLASTY Bilateral     Prior to Admission medications   Medication Sig Start Date End Date Taking? Authorizing Provider  acetaminophen (TYLENOL) 500 MG tablet Take 1,000 mg by mouth every 6 (six) hours as needed.   Yes [provider]  atorvastatin (LIPITOR) 40 MG tablet Take 40 mg by mouth daily.   Yes [provider]  cetirizine (ZYRTEC) 10 MG tablet Take 10 mg by mouth daily as needed for allergies.   Yes [provider]  conjugated estrogens (PREMARIN) vaginal cream Place 1 Applicatorful vaginally 2 (two) times a week.    Yes [provider]  Cranberry 12600 MG CAPS Take 1 capsule by mouth daily.    Yes [provider]  levothyroxine (SYNTHROID, LEVOTHROID) 25 MCG tablet Take 25 mcg by mouth daily before breakfast.   Yes [provider]  lisinopril (PRINIVIL,ZESTRIL) 10 MG tablet Take 10 mg by mouth daily.   Yes [provider]  Melatonin 5 MG TABS Take by mouth.   Yes [provider]  Multiple Vitamin (MULTIVITAMIN WITH MINERALS) TABS tablet Take 1 tablet by mouth daily.   Yes [provider]  pantoprazole (PROTONIX) 40 MG tablet Take 1 tablet (40 mg total) by mouth 2 (two) times daily before a meal. Patient taking differently: Take 40 mg by  mouth daily.  07/07/15  Yes Shaune Pollack, MD  sertraline (ZOLOFT) 100 MG tablet Take 100 mg by mouth at bedtime.   Yes [provider]  sucralfate (CARAFATE) 1 g tablet Take 1 g by mouth 2 (two) times daily.   Yes [provider]  traMADol (ULTRAM) 50 MG tablet Take 50-100 mg by mouth daily as needed.   Yes [provider]  docusate sodium (COLACE) 100 MG capsule Take 1 capsule (100 mg total) by mouth 2 (two) times  daily. Patient not taking: Reported on 09/11/2017 01/02/16   Enid Baas, MD  ferrous sulfate 325 (65 FE) MG tablet Take 1 tablet (325 mg total) by mouth daily. Patient not taking: Reported on 09/11/2017 01/02/16   Enid Baas, MD  lidocaine (LIDODERM) 5 % Place 1 patch onto the skin daily. Remove & Discard patch within 12 hours or as directed by MD Apply to right hip Patient not taking: Reported on 09/11/2017 01/02/16   Enid Baas, MD  methocarbamol (ROBAXIN) 500 MG tablet Take 1 tablet (500 mg total) by mouth 3 (three) times daily. X 5 days Patient not taking: Reported on 09/11/2017 01/03/16   Enid Baas, MD  oxybutynin (DITROPAN-XL) 5 MG 24 hr tablet Take 1 tablet (5 mg total) by mouth at bedtime. Patient not taking: Reported on 09/11/2017 01/03/16   Enid Baas, MD  senna (SENOKOT) 8.6 MG TABS tablet Take 1 tablet (8.6 mg total) by mouth daily. Patient not taking: Reported on 09/11/2017 01/02/16   Enid Baas, MD     Allergies Ciprofloxacin  Family History  Problem Relation Age of Onset  . CAD Mother   . Diabetes Mother   . CAD Father   . Diabetes Father   . Bladder Cancer Neg Hx   . Prostate cancer Neg Hx   . Kidney cancer Neg Hx     Social History Social History   Tobacco Use  . Smoking status: Never Smoker  . Smokeless tobacco: Never Used  Substance Use Topics  . Alcohol use: No  . Drug use: No    Review of Systems  Constitutional: No fever/chills Eyes: No visual changes.  ENT: No neck pain Cardiovascular: Denies chest pain. Respiratory: Denies shortness of breath. Gastrointestinal: No abdominal pain.  No nausea, no vomiting.   Genitourinary: Negative for dysuria. Musculoskeletal: Low back pain as described above, right hip pain Skin: Negative for rash. Neurological: No focal deficits   ____________________________________________   PHYSICAL EXAM:  VITAL SIGNS: ED Triage Vitals  Enc Vitals Group     BP 09/11/17 1245  (!) 159/81     Pulse Rate 09/11/17 1245 95     Resp 09/11/17 1245 20     Temp 09/11/17 1245 98.2 F (36.8 C)     Temp Source 09/11/17 1245 Oral     SpO2 09/11/17 1245 95 %     Weight 09/11/17 1248 77.1 kg (170 lb)     Height 09/11/17 1248 1.6 m (5\' 3" )     Head Circumference --      Peak Flow --      Pain Score 09/11/17 1247 10     Pain Loc --      Pain Edu? --      Excl. in GC? --     Constitutional: Alert and oriented. No acute distress.  Eyes: Conjunctivae are normal.  Head: Atraumatic. Nose: No congestion/rhinnorhea. Mouth/Throat: Mucous membranes are moist.   Neck:  Painless ROM, no bony abnormalities, no vertebral tenderness palpation Cardiovascular:  Normal rate, regular rhythm. Grossly normal heart sounds.  Good peripheral circulation. Respiratory: Normal respiratory effort.  No retractions. Lungs CTAB. Gastrointestinal: Soft and nontender. No distention.   Genitourinary: deferred Musculoskeletal: Pain in the right hip with axial load and external rotation, leg is held in internal rotation but does not appear grossly shortened.  Warm and well perfused distally Neurologic:  Normal speech and language. No gross focal neurologic deficits are appreciated.  Skin:  Skin is warm, dry and intact. No rash noted. Psychiatric: Mood and affect are normal. Speech and behavior are normal.  ____________________________________________   LABS (all labs ordered are listed, but only abnormal results are displayed)  Labs Reviewed  BASIC METABOLIC PANEL - Abnormal; Notable for the following components:      Result Value   Chloride 98 (*)    Glucose, Bld 112 (*)    GFR calc non Af Amer 55 (*)    All other components within normal limits  CBC WITH DIFFERENTIAL/PLATELET  PROTIME-INR  TYPE AND SCREEN    ____________________________________________  EKG   ____________________________________________  RADIOLOGY  None ____________________________________________   PROCEDURES  Procedure(s) performed: No  .Sedation Date/Time: 09/11/2017 3:39 PM Performed by: Jene Every, MD Authorized by: Jene Every, MD   Consent:    Consent obtained:  Written (electronic informed consent)   Consent given by:  Patient   Risks discussed:  Allergic reaction, dysrhythmia, inadequate sedation, nausea, vomiting, respiratory compromise necessitating ventilatory assistance and intubation, prolonged sedation necessitating reversal and prolonged hypoxia resulting in organ damage Universal protocol:    Procedure explained and questions answered to patient or proxy's satisfaction: yes     Relevant documents present and verified: yes     Test results available and properly labeled: yes     Imaging studies available: yes     Required blood products, implants, devices, and special equipment available: yes     Immediately prior to procedure a time out was called: yes     Patient identity confirmation method:  Arm band Indications:    Procedure performed:  Dislocation reduction   Procedure necessitating sedation performed by:  Physician performing sedation   Intended level of sedation:  Moderate (conscious sedation) Pre-sedation assessment:    Time since last food or drink:  6 hours   ASA classification: class 2 - patient with mild systemic disease     Neck mobility: normal     Mouth opening:  3 or more finger widths   Mallampati score:  I - soft palate, uvula, fauces, pillars visible   Pre-sedation assessments completed and reviewed: airway patency, cardiovascular function, hydration status, mental status and pain level     Pre-sedation assessment completed:  09/11/2017 3:45 PM Immediate pre-procedure details:    Reassessment: Patient reassessed immediately prior to procedure     Reviewed: vital  signs, relevant labs/tests and NPO status     Verified: bag valve mask available, emergency equipment available, intubation equipment available, IV patency confirmed, oxygen available, reversal medications available and suction available   Procedure details (see MAR for exact dosages):    Intra-procedure monitoring:  Blood pressure monitoring, continuous pulse oximetry, cardiac monitor, frequent vital sign checks and frequent LOC assessments   Intra-procedure events: none     Total Provider sedation time (minutes):  10 Post-procedure details:    Post-sedation assessment completed:  09/11/2017 3:55 PM   Attendance: Constant attendance by certified staff until patient recovered     Recovery: Patient returned to pre-procedure baseline     Post-sedation  assessments completed and reviewed: airway patency and cardiovascular function     Patient tolerance:  Tolerated well, no immediate complications  .Ortho Injury Treatment Date/Time: 09/11/2017 3:57 PM Performed by: Jene EveryKinner, Ketra Duchesne, MD Authorized by: Jene EveryKinner, Zymiere Trostle, MD   Consent:    Consent obtained:  Written   Consent given by:  Patient   Risks discussed:  Fracture, nerve damage, restricted joint movement, vascular damage and recurrent dislocation   Alternatives discussed:  No treatmentInjury location: hip Location details: right hip Injury type: dislocation Dislocation type: anterior Spontaneous dislocation: no Prosthesis: yes Pre-procedure neurovascular assessment: neurovascularly intact Pre-procedure distal perfusion: normal Pre-procedure neurological function: normal Manipulation performed: yes Reduction method: traction and counter traction Reduction successful: no Post-procedure distal perfusion: normal Post-procedure range of motion: unchanged      Critical Care performed: No ____________________________________________   INITIAL IMPRESSION / ASSESSMENT AND PLAN / ED COURSE  Pertinent labs & imaging results that were  available during my care of the patient were reviewed by me and considered in my medical decision making (see chart for details).  Patient presents for fall from standing which appears to be mechanical, lab work is overall reassuring, tenderness along the right hip and some pain with axial load, pending x-rays of the hip.  Treated with 50 of IV fentanyl  ----------------------------------------- 3:38 PM on 09/11/2017 -----------------------------------------  X-rays confirmed dislocation, patient has consented for relocation, preparing for moderate sedation.  Discussed with Dr. Allena KatzPatel of orthopedics who agrees with plan.  ----------------------------------------- 3:55 PM on 09/11/2017 -----------------------------------------  Attempted reduction under moderate sedation unable to reduce hip fully.  Discussed with Dr. Allena KatzPatel who states he can be here around 5 PM to perform reduction ____________________________________________   FINAL CLINICAL IMPRESSION(S) / ED DIAGNOSES  Final diagnoses:  Dislocation of right hip, initial encounter [facility](HCC)        Note:  This document was prepared using Dragon voice recognition software and may include unintentional dictation errors.    Jene EveryKinner, Isaiahs Chancy, MD 09/11/17 [phone]

## 2017-09-11 NOTE — ED Provider Notes (Addendum)
Assumed care from Dr. Irven EasterlyKidder.  Patient had initially had reduction attempted under etomidate unsuccessfully.  Dr. Allena KatzPatel with orthopedic surgery was kind enough to come down to the emergency department.  I directed sedation while he reduced the hip.  Given patient's concern for ambulation will plan on admission.   Elizabeth Shepard, Elizabeth Scaff, MD 09/11/17 [id number]    Elizabeth Shepard, Elizabeth Brauer, MD 09/11/17 [phone]

## 2017-09-11 NOTE — ED Triage Notes (Signed)
Pt to ED via EMS from home c/o fall today in the yard with right hip pain.  States has had 2 months of right hip pain and discomfort but worse after the fall and "doesn't feel right".  Per EMS patient with shortening and medial rotation, small laceration under left eye bleeding controlled.  EMS states patient took 100mg  tramadol and 1g tylenol around noon today, patient unsure of amount for sure.  Pt presents A&Ox4, bilateral hip replacements, denies other pain ATT.

## 2017-09-11 NOTE — H&P (Addendum)
[employer] - [place] [facility]   PATIENT NAME: Elizabeth Shepard    MR#:  [id number]  DATE OF BIRTH:  1941/06/07  DATE OF ADMISSION:  09/11/2017  PRIMARY CARE PHYSICIAN: Raynelle Bring   REQUESTING/REFERRING PHYSICIAN: Dr. Derrill Kay  CHIEF COMPLAINT:   Chief Complaint  Patient presents with  . Fall  . Hip Pain   Fall and right hip pain today. HISTORY OF PRESENT ILLNESS:  Elizabeth Shepard  is a 77 y.o. female with a known history of hypertension, hyperlipidemia, hypothyroidism and generalized pain.  The patient has had back pain over the last week.  She fell by accident today and hurt her right hip.  She denies any syncope, loss of consciousness or seizure.  She was found right hip dislocation and got reduction by orthopedic surgeon Dr. Allena Katz in the ED.  Since patient cannot remove and her husband cannot take care of for her at home, Dr. Derrill Kay requested observation.  PAST MEDICAL HISTORY:   Past Medical History:  Diagnosis Date  . Depression   . Generalized pain   . Hypercholesteremia   . Hypertension   . Hypothyroidism   . Thyroid disease   . UTI (lower urinary tract infection)     PAST SURGICAL HISTORY:   Past Surgical History:  Procedure Laterality Date  . AUGMENTATION MAMMAPLASTY Bilateral 1980  . BREAST BIOPSY Right 1970   neg  . COLONOSCOPY WITH PROPOFOL N/A 09/12/2015   Procedure: COLONOSCOPY WITH PROPOFOL;  Surgeon: Christena Deem, MD;  Location: [facility] ENDOSCOPY;  Service: Endoscopy;  Laterality: N/A;  . ESOPHAGOGASTRODUODENOSCOPY N/A 07/06/2015   Procedure: ESOPHAGOGASTRODUODENOSCOPY (EGD);  Surgeon: Christena Deem, MD;  Location: [facility] ENDOSCOPY;  Service: Endoscopy;  Laterality: N/A;  . ESOPHAGOGASTRODUODENOSCOPY (EGD) WITH PROPOFOL N/A 09/12/2015   Procedure: ESOPHAGOGASTRODUODENOSCOPY (EGD) WITH PROPOFOL;  Surgeon: Christena Deem, MD;  Location: [facility] ENDOSCOPY;  Service: Endoscopy;  Laterality: N/A;  . HIP SURGERY    . Mastoid  surgery    . TONSILLECTOMY    . TOTAL HIP ARTHROPLASTY Bilateral     SOCIAL HISTORY:   Social History   Tobacco Use  . Smoking status: Never Smoker  . Smokeless tobacco: Never Used  Substance Use Topics  . Alcohol use: No    FAMILY HISTORY:   Family History  Problem Relation Age of Onset  . CAD Mother   . Diabetes Mother   . CAD Father   . Diabetes Father   . Bladder Cancer Neg Hx   . Prostate cancer Neg Hx   . Kidney cancer Neg Hx     DRUG ALLERGIES:   Allergies  Allergen Reactions  . Ciprofloxacin Itching and Rash    REVIEW OF SYSTEMS:   Review of Systems  Constitutional: Negative for chills, fever and malaise/fatigue.  HENT: Positive for hearing loss. Negative for sore throat.   Eyes: Negative for blurred vision and double vision.  Respiratory: Negative for cough, hemoptysis, shortness of breath, wheezing and stridor.   Cardiovascular: Negative for chest pain, palpitations, orthopnea and leg swelling.  Gastrointestinal: Negative for abdominal pain, blood in stool, diarrhea, melena, nausea and vomiting.  Genitourinary: Negative for dysuria, flank pain and hematuria.  Musculoskeletal: Positive for joint pain. Negative for back pain.  Skin: Negative for rash.  Neurological: Negative for dizziness, sensory change, focal weakness, seizures, loss of consciousness, weakness and headaches.  Endo/Heme/Allergies: Negative for polydipsia.  Psychiatric/Behavioral: Negative for depression. The patient is not nervous/anxious.     MEDICATIONS AT HOME:  Prior to Admission medications   Medication Sig Start Date End Date Taking? Authorizing Provider  acetaminophen (TYLENOL) 500 MG tablet Take 1,000 mg by mouth every 6 (six) hours as needed.   Yes [provider]  atorvastatin (LIPITOR) 40 MG tablet Take 40 mg by mouth daily.   Yes [provider]  cetirizine (ZYRTEC) 10 MG tablet Take 10 mg by mouth daily as needed for allergies.   Yes [provider]  conjugated estrogens (PREMARIN) vaginal cream Place 1 Applicatorful vaginally 2 (two) times a week.    Yes [provider]  Cranberry 12600 MG CAPS Take 1 capsule by mouth daily.    Yes [provider]  levothyroxine (SYNTHROID, LEVOTHROID) 25 MCG tablet Take 25 mcg by mouth daily before breakfast.   Yes [provider]  lisinopril (PRINIVIL,ZESTRIL) 10 MG tablet Take 10 mg by mouth daily.   Yes [provider]  Melatonin 5 MG TABS Take by mouth.   Yes [provider]  Multiple Vitamin (MULTIVITAMIN WITH MINERALS) TABS tablet Take 1 tablet by mouth daily.   Yes [provider]  pantoprazole (PROTONIX) 40 MG tablet Take 1 tablet (40 mg total) by mouth 2 (two) times daily before a meal. Patient taking differently: Take 40 mg by mouth daily.  07/07/15  Yes Shaune Pollackhen, Elizbeth Posa, MD  sertraline (ZOLOFT) 100 MG tablet Take 100 mg by mouth at bedtime.   Yes [provider]  sucralfate (CARAFATE) 1 g tablet Take 1 g by mouth 2 (two) times daily.   Yes [provider]  traMADol (ULTRAM) 50 MG tablet Take 50-100 mg by mouth daily as needed.   Yes [provider]  docusate sodium (COLACE) 100 MG capsule Take 1 capsule (100 mg total) by mouth 2 (two) times daily. Patient not taking: Reported on 09/11/2017 01/02/16   Enid BaasKalisetti, Radhika, MD  ferrous sulfate 325 (65 FE) MG tablet Take 1 tablet (325 mg total) by mouth daily. Patient not taking: Reported on 09/11/2017 01/02/16   Enid BaasKalisetti, Radhika, MD  lidocaine (LIDODERM) 5 % Place 1 patch onto the skin daily. Remove & Discard patch within 12 hours or as directed by MD Apply to right hip Patient not taking: Reported on 09/11/2017 01/02/16   Enid BaasKalisetti, Radhika, MD  methocarbamol (ROBAXIN) 500 MG tablet Take 1 tablet (500 mg total) by mouth 3 (three) times daily. X 5 days Patient not taking: Reported on 09/11/2017 01/03/16   Enid BaasKalisetti, Radhika, MD  oxybutynin (DITROPAN-XL) 5 MG 24 hr  tablet Take 1 tablet (5 mg total) by mouth at bedtime. Patient not taking: Reported on 09/11/2017 01/03/16   Enid BaasKalisetti, Radhika, MD  senna (SENOKOT) 8.6 MG TABS tablet Take 1 tablet (8.6 mg total) by mouth daily. Patient not taking: Reported on 09/11/2017 01/02/16   Enid BaasKalisetti, Radhika, MD      VITAL SIGNS:  Blood pressure (!) 168/136, pulse 93, temperature 98.2 F (36.8 C), temperature source Oral, resp. rate (!) 23, height 5\' 3"  (1.6 m), weight 170 lb (77.1 kg), SpO2 100 %.  PHYSICAL EXAMINATION:  Physical Exam  GENERAL:  77 y.o.-year-old patient lying in the bed with no acute distress.  EYES: Pupils equal, round, reactive to light and accommodation. No scleral icterus. Extraocular muscles intact.  HEENT: Head atraumatic, normocephalic. Oropharynx and nasopharynx clear.  Facial abrasion on the left side. NECK:  Supple, no jugular venous distention. No thyroid enlargement, no tenderness.  LUNGS: Normal breath sounds bilaterally, no wheezing, rales,rhonchi or crepitation. No use of accessory muscles  of respiration.  CARDIOVASCULAR: S1, S2 normal. No murmurs, rubs, or gallops.  ABDOMEN: Soft, nontender, nondistended. Bowel sounds present. No organomegaly or mass.  EXTREMITIES: No pedal edema, cyanosis, or clubbing.  Right leg in fixation. NEUROLOGIC: Cranial nerves II through XII are intact. Muscle strength 5/5 in all extremities except the right leg. Sensation intact. Gait not checked.  PSYCHIATRIC: The patient is alert and oriented x 3.  SKIN: No obvious rash, lesion, or ulcer.   LABORATORY PANEL:   CBC Recent Labs  Lab 09/11/17 1329  WBC 7.4  HGB 15.0  HCT 44.9  PLT 380   ------------------------------------------------------------------------------------------------------------------  Chemistries  Recent Labs  Lab 09/11/17 1329  NA 135  K 3.7  CL 98*  CO2 23  GLUCOSE 112*  BUN 18  CREATININE 0.98  CALCIUM 9.7    ------------------------------------------------------------------------------------------------------------------  Cardiac Enzymes No results for input(s): TROPONINI in the last 168 hours. ------------------------------------------------------------------------------------------------------------------  RADIOLOGY:  Dg Chest 1 View  Result Date: 09/11/2017 CLINICAL DATA:  Fall.  Right hip dislocation EXAM: CHEST  1 VIEW COMPARISON:  12/28/2015 FINDINGS: Heart size upper normal. Negative for heart failure. Lungs are clear without infiltrate or effusion. Mild atherosclerotic calcification aortic arch Retrocardiac density unchanged most compatible with hiatal hernia. Bilateral calcified breast implants IMPRESSION: No acute abnormality.  Probable hiatal hernia Electronically Signed   By: Marlan Palau M.D.   On: 09/11/2017 15:03   Dg Hip Unilat W Or Wo Pelvis 2-3 Views Right  Result Date: 09/11/2017 CLINICAL DATA:  Post reduction total-hip replacement dislocation on the right EXAM: DG HIP (WITH OR WITHOUT PELVIS) 2-3V RIGHT COMPARISON:  Study obtained earlier in the day FINDINGS: Frontal pelvis as well as frontal and lateral right hip images were obtained. There has been successful reduction of dislocation of the total hip prosthesis on the right. There are total hip prostheses bilaterally with prosthetic components appearing well-seated on current examination. No acute fracture or dislocation. No erosive change. Bones are osteoporotic. Note that there is moderate osteoporosis surrounding the acetabular components of each prosthesis. IMPRESSION: Successful reduction of total-hip replacement dislocation on the right. Prosthetic components bowel bilaterally appear well seated. No acute fracture or dislocation. No periarticular osteoporosis surrounding the acetabular component of each total hip prosthesis. Electronically Signed   By: Bretta Bang III M.D.   On: 09/11/2017 18:03   Dg Hip Unilat With  Pelvis 2-3 Views Right  Result Date: 09/11/2017 CLINICAL DATA:  Fall EXAM: DG HIP (WITH OR WITHOUT PELVIS) 2-3V RIGHT COMPARISON:  12/28/2015 FINDINGS: Right hip replacement is dislocated superiorly.  No fracture. Left hip replacement in satisfactory alignment. Negative for pelvic fracture. Lumbar degenerative change and scoliosis IMPRESSION: Superior dislocation right hip prosthesis. Electronically Signed   By: Marlan Palau M.D.   On: 09/11/2017 15:02      IMPRESSION AND PLAN:  Right hip dislocation. Patient will be placed for observation. Pain control and physical therapy evaluation.  Hypertension.  Continue home hypertension medication. Hypothyroidism.  Continue home Synthroid.  Discussed with Dr. Allena Katz, orthopedic surgeon. All the records are reviewed and case discussed with ED provider. Management plans discussed with the patient, family and they are in agreement.  CODE STATUS: Full code  TOTAL TIME TAKING CARE OF THIS PATIENT: 45 minutes.    Shaune Pollack M.D on 09/11/2017 at 6:25 PM  Between 7am to 6pm - Pager - [phone]  After 6pm go to [email or web address] - Scientist, research (life sciences) [facility]  Office  [phone]  CC: Primary care physician;  [facility]   Note: This dictation was prepared with Nurse, children's dictation along with smaller Lobbyist. Any transcriptional errors that result from this process are unin

## 2017-09-11 NOTE — ED Notes (Signed)
[place] Scale charted wrong time unable to d/c

## 2017-09-11 NOTE — Consult Note (Signed)
ORTHOPAEDIC CONSULTATION  REQUESTING PHYSICIAN: Dr. Olga Coaster  Chief Complaint:   R hip pain  History of Present Illness: Elizabeth Shepard is a 77 y.o. female who had a fall ~11am at home today.  She noted immediate onset of pain in her R hip with decreased ability to move it. She has had bilateral THA, the right one done ~8 years ago by Dr. Lequita Halt in [place]. X-rays in ED show R hip prosthetic dislocation. She has not had any dislocation episodes previously. Pain is located deep in her groin and lateral hip. Pain is described as sharp and rated 9/10 in severity. Pain worse with any movement attempts, and improved only with keeping leg still. She complains of pain in her right hip when she moves her leg.      Past Medical History:  Diagnosis Date  . Depression   . Generalized pain   . Hypercholesteremia   . Hypertension   . Hypothyroidism   . Thyroid disease   . UTI (lower urinary tract infection)    Past Surgical History:  Procedure Laterality Date  . AUGMENTATION MAMMAPLASTY Bilateral 1980  . BREAST BIOPSY Right 1970   neg  . COLONOSCOPY WITH PROPOFOL N/A 09/12/2015   Procedure: COLONOSCOPY WITH PROPOFOL;  Surgeon: Christena Deem, MD;  Location: [facility] ENDOSCOPY;  Service: Endoscopy;  Laterality: N/A;  . ESOPHAGOGASTRODUODENOSCOPY N/A 07/06/2015   Procedure: ESOPHAGOGASTRODUODENOSCOPY (EGD);  Surgeon: Christena Deem, MD;  Location: [facility] ENDOSCOPY;  Service: Endoscopy;  Laterality: N/A;  . ESOPHAGOGASTRODUODENOSCOPY (EGD) WITH PROPOFOL N/A 09/12/2015   Procedure: ESOPHAGOGASTRODUODENOSCOPY (EGD) WITH PROPOFOL;  Surgeon: Christena Deem, MD;  Location: [facility] ENDOSCOPY;  Service: Endoscopy;  Laterality: N/A;  . HIP SURGERY    . Mastoid surgery    . TONSILLECTOMY    . TOTAL HIP ARTHROPLASTY Bilateral    Social History   Socioeconomic History  . Marital status: Married    Spouse name: Not on file  . Number  of children: Not on file  . Years of education: Not on file  . Highest education level: Not on file  Occupational History  . Not on file  Social Needs  . Financial resource strain: Not on file  . Food insecurity:    Worry: Not on file    Inability: Not on file  . Transportation needs:    Medical: Not on file    Non-medical: Not on file  Tobacco Use  . Smoking status: Never Smoker  . Smokeless tobacco: Never Used  Substance and Sexual Activity  . Alcohol use: No  . Drug use: No  . Sexual activity: Not on file  Lifestyle  . Physical activity:    Days per week: Not on file    Minutes per session: Not on file  . Stress: Not on file  Relationships  . Social connections:    Talks on phone: Not on file    Gets together: Not on file    Attends religious service: Not on file    Active member of club or organization: Not on file    Attends meetings of clubs or organizations: Not on file    Relationship status: Not on file  Other Topics Concern  . Not on file  Social History Narrative  . Not on file   Family History  Problem Relation Age of Onset  . CAD Mother   . Diabetes Mother   . CAD Father   . Diabetes Father   . Bladder Cancer Neg Hx   .  Prostate cancer Neg Hx   . Kidney cancer Neg Hx    Allergies  Allergen Reactions  . Ciprofloxacin Itching and Rash   Prior to Admission medications   Medication Sig Start Date End Date Taking? Authorizing Provider  acetaminophen (TYLENOL) 500 MG tablet Take 1,000 mg by mouth every 6 (six) hours as needed.   Yes [provider]  atorvastatin (LIPITOR) 40 MG tablet Take 40 mg by mouth daily.   Yes [provider]  cetirizine (ZYRTEC) 10 MG tablet Take 10 mg by mouth daily as needed for allergies.   Yes [provider]  conjugated estrogens (PREMARIN) vaginal cream Place 1 Applicatorful vaginally 2 (two) times a week.    Yes [provider]  Cranberry 12600 MG CAPS Take 1 capsule by mouth daily.     Yes [provider]  levothyroxine (SYNTHROID, LEVOTHROID) 25 MCG tablet Take 25 mcg by mouth daily before breakfast.   Yes [provider]  lisinopril (PRINIVIL,ZESTRIL) 10 MG tablet Take 10 mg by mouth daily.   Yes [provider]  Melatonin 5 MG TABS Take by mouth.   Yes [provider]  Multiple Vitamin (MULTIVITAMIN WITH MINERALS) TABS tablet Take 1 tablet by mouth daily.   Yes [provider]  pantoprazole (PROTONIX) 40 MG tablet Take 1 tablet (40 mg total) by mouth 2 (two) times daily before a meal. Patient taking differently: Take 40 mg by mouth daily.  07/07/15  Yes Shaune Pollackhen, Qing, MD  sertraline (ZOLOFT) 100 MG tablet Take 100 mg by mouth at bedtime.   Yes [provider]  sucralfate (CARAFATE) 1 g tablet Take 1 g by mouth 2 (two) times daily.   Yes [provider]  traMADol (ULTRAM) 50 MG tablet Take 50-100 mg by mouth daily as needed.   Yes [provider]  docusate sodium (COLACE) 100 MG capsule Take 1 capsule (100 mg total) by mouth 2 (two) times daily. Patient not taking: Reported on 09/11/2017 01/02/16   Enid BaasKalisetti, Radhika, MD  ferrous sulfate 325 (65 FE) MG tablet Take 1 tablet (325 mg total) by mouth daily. Patient not taking: Reported on 09/11/2017 01/02/16   Enid BaasKalisetti, Radhika, MD  lidocaine (LIDODERM) 5 % Place 1 patch onto the skin daily. Remove & Discard patch within 12 hours or as directed by MD Apply to right hip Patient not taking: Reported on 09/11/2017 01/02/16   Enid BaasKalisetti, Radhika, MD  methocarbamol (ROBAXIN) 500 MG tablet Take 1 tablet (500 mg total) by mouth 3 (three) times daily. X 5 days Patient not taking: Reported on 09/11/2017 01/03/16   Enid BaasKalisetti, Radhika, MD  oxybutynin (DITROPAN-XL) 5 MG 24 hr tablet Take 1 tablet (5 mg total) by mouth at bedtime. Patient not taking: Reported on 09/11/2017 01/03/16   Enid BaasKalisetti, Radhika, MD  senna (SENOKOT) 8.6 MG TABS tablet Take 1 tablet (8.6 mg total) by mouth  daily. Patient not taking: Reported on 09/11/2017 01/02/16   Enid BaasKalisetti, Radhika, MD   Recent Labs    09/11/17 1329  WBC 7.4  HGB 15.0  HCT 44.9  PLT 380  K 3.7  CL 98*  CO2 23  BUN 18  CREATININE 0.98  GLUCOSE 112*  CALCIUM 9.7   Dg Chest 1 View  Result Date: 09/11/2017 CLINICAL DATA:  Fall.  Right hip dislocation EXAM: CHEST  1 VIEW COMPARISON:  12/28/2015 FINDINGS: Heart size upper normal. Negative for heart failure. Lungs are clear without infiltrate or effusion. Mild atherosclerotic calcification aortic arch Retrocardiac density unchanged most  compatible with hiatal hernia. Bilateral calcified breast implants IMPRESSION: No acute abnormality.  Probable hiatal hernia Electronically Signed   By: Marlan Palau M.D.   On: 09/11/2017 15:03   Dg Hip Unilat With Pelvis 2-3 Views Right  Result Date: 09/11/2017 CLINICAL DATA:  Fall EXAM: DG HIP (WITH OR WITHOUT PELVIS) 2-3V RIGHT COMPARISON:  12/28/2015 FINDINGS: Right hip replacement is dislocated superiorly.  No fracture. Left hip replacement in satisfactory alignment. Negative for pelvic fracture. Lumbar degenerative change and scoliosis IMPRESSION: Superior dislocation right hip prosthesis. Electronically Signed   By: Marlan Palau M.D.   On: 09/11/2017 15:02     Positive ROS: All other systems have been reviewed and were otherwise negative with the exception of those mentioned in the HPI and as above.  Physical Exam: BP (!) 168/136   Pulse 93   Temp 98.2 F (36.8 C) (Oral)   Resp (!) 23   Ht 5\' 3"  (1.6 m)   Wt 77.1 kg (170 lb)   SpO2 100%   BMI 30.11 kg/m  General:  Alert, no acute distress Psychiatric:  Patient is competent for consent with normal mood and affect   Cardiovascular:  No pedal edema, regular rate and rhythm Respiratory:  No wheezing, non-labored breathing GI:  Abdomen is soft and non-tender Skin:  No lesions in the area of chief complaint, no erythema Neurologic:  Sensation intact distally, CN grossly  intact Lymphatic:  No axillary or cervical lymphadenopathy  Orthopedic Exam:  RLE: 5/5 DF/PF/EHL SILT s/s/t/sp/dp distr Foot wwp Leg shortened and internally rotated   X-rays:  As above: R periprosthetic hip dislocation.  Assessment/Plan: 1. We discussed that her hip remained dislocated and the next best course of action would be closed reduction under sedation in the ED. After discussion of risks, benefits, and alternatives to procedure, the patient elected to proceed.  Please see procedure note below.  2. Patient will have posterior hip precautions. These were reviewed with patient. Keep knee immobilizer in place. Can be partial weight-bearing on RLE.  3. Recommend follow up with Dr. Lequita Halt in [place], but she can certainly follow up with [place] if needed.       Procedure Note: Closed reduction of R periprosthetic hip dislocation The patient concurred with the proposed plan, giving informed consent. The site of surgery was properly noted/marked. A Time Out was held and the patient identity, procedure, and laterality was confirmed. After administration of adequate anesthesia with propofol sedation, reduction maneuver consisting of hip flexion, adduction, and rotation was performed. A palpable clunk was felt as the hip reduced. X-rays were obtained to confirm reduction. A knee immobilizer was placed. The leg was kept in an abducted position with pillows in between the legs. The patient was awakened from anesthesia without any further complication. Neurovascular exam was grossly intact.      Signa Kell   09/11/2017 4:56 PM

## 2017-09-12 LAB — BASIC METABOLIC PANEL
ANION GAP: 11 (ref 5–15)
BUN: 24 mg/dL — ABNORMAL HIGH (ref 6–20)
CALCIUM: 9.2 mg/dL (ref 8.9–10.3)
CHLORIDE: 101 mmol/L (ref 101–111)
CO2: 23 mmol/L (ref 22–32)
CREATININE: 1.48 mg/dL — AB (ref 0.44–1.00)
GFR calc Af Amer: 39 mL/min — ABNORMAL LOW (ref >60)
GFR calc non Af Amer: 33 mL/min — ABNORMAL LOW (ref >60)
GLUCOSE: 133 mg/dL — AB (ref 65–99)
Potassium: 4.5 mmol/L (ref 3.5–5.1)
Sodium: 135 mmol/L (ref 135–145)

## 2017-09-12 LAB — CBC
HCT: 40.5 % (ref 35.0–47.0)
Hemoglobin: 13.8 g/dL (ref 12.0–16.0)
MCH: 33.2 pg (ref 26.0–34.0)
MCHC: 33.9 g/dL (ref 32.0–36.0)
MCV: 97.9 fL (ref 80.0–100.0)
Platelets: 356 10*3/uL (ref 150–440)
RBC: 4.14 MIL/uL (ref 3.80–5.20)
RDW: 12.3 % (ref 11.5–14.5)
WBC: 9.3 10*3/uL (ref 3.6–11.0)

## 2017-09-12 MED ORDER — SODIUM CHLORIDE 0.9 % IV BOLUS
500.0000 mL | Freq: Once | INTRAVENOUS | Status: AC
  Administered 2017-09-12: 500 mL via INTRAVENOUS

## 2017-09-12 MED ORDER — SODIUM CHLORIDE 0.9 % IV SOLN
INTRAVENOUS | Status: AC
  Administered 2017-09-12 – 2017-09-13 (×2): via INTRAVENOUS

## 2017-09-12 NOTE — Care Management Note (Signed)
Case Management Note  Patient Details  Name: Elizabeth Shepard MRN: [id number] Date of Birth: 04/14/1941  Subjective/Objective:  Have met with patient multiple times today. She will be discharging tomorrow since she is not comfortable discharging today, she has no ride, she doesn't feel well, she hasn't had a BM and she is having difficulty urinating. She lives with her spouse and he isn't able to assist her at home. She has no one else to assist her. She will need a walker and bsc. Ordered from [facility] with [facility]. Discussed home health and patient is agreeable. No agency preference. Referral to [facility] for RN, PT, OT, HHA and SW.                   Action/Plan: [facility]  Expected Discharge Date:                  Expected Discharge Plan:  Home w Home Health Services  In-House Referral:     Discharge planning Services  CM Consult  Post Acute Care Choice:  Durable Medical Equipment, Home Health Choice offered to:  Patient  DME Arranged:  Bedside commode, Walker rolling DME Agency:  [facility]  HH Arranged:  RN, PT, OT, Nurse's Aide HH Agency:  [facility]  Status of Service:  In process, will continue to follow  If discussed at Long Length of Stay Meetings, dates discussed:    Additional Comments:  Lisa M Jacobs, RN 09/12/2017, 1:56 PM  

## 2017-09-12 NOTE — Progress Notes (Signed)
Patient has not urinated since being admitted this evening.Patient not complaining of any discomfort. Bladder scanned 218 in bladder. MD notified. Orders received to start fluids.

## 2017-09-12 NOTE — Evaluation (Signed)
Physical Therapy Evaluation Patient Details Name: Elizabeth Shepard MRN: [id number] DOB: Jun 17, 1941 Today's Date: 09/12/2017   History of Present Illness  presented to ER secondary to fall with R hip pain; admitted with R hip dislocation (THR x8 years prior), status post closed reduction in ER (09/11/17).  Clinical Impression  Upon evaluation, patient alert and oriented to basic information; follows commands, but demonstrates mild confusion at times.  R hip strength grossly 3-/5 with functional activities; KI donned throughout session.  Demonstrates ability to complete bed mobility with cga/min assist; sit/stand, basic transfers and gait (50' x2) with RW, cga; stairs (x4) with bilat rails, cga/min assist.  Frequent cuing for awareness and adherence to THPs and PWB restrictions with functional activities, but no significant buckling or LOB noted.  Reviewed donning/doffing R KI; anticipate need of helper to ensure placement and proper fit of brace. Would benefit from skilled PT to address above deficits and promote optimal return to PLOF; Recommend transition to HHPT with maximal home support services upon discharge from acute hospitalization.     Follow Up Recommendations Home health PT(HHOT, HHRN, HHaide, HHRN)    Equipment Recommendations  (reports having RW and BSC in home already)    Recommendations for Other Services       Precautions / Restrictions Precautions Precautions: Fall;Posterior Hip Restrictions Weight Bearing Restrictions: Yes RLE Weight Bearing: Partial weight bearing      Mobility  Bed Mobility Overal bed mobility: Needs Assistance Bed Mobility: Supine to Sit     Supine to sit: Supervision     General bed mobility comments: cuing for adherence to posterior THPs  Transfers Overall transfer level: Needs assistance Equipment used: Rolling walker (2 wheeled) Transfers: Sit to/from Stand Sit to Stand: Min guard         General transfer comment: cuing for hand  placement  Ambulation/Gait Ambulation/Gait assistance: Min guard Ambulation Distance (Feet): 50 Feet(x2) Assistive device: Rolling walker (2 wheeled)       General Gait Details: encouraged for step to gait pattern and WBing bilat UEs in R LE stance to maximize adherence to PWB R LE.  No buckling or LOB.  Decreased cadence and gait speed; intermittent cuing fo redirection to task.  Stairs Stairs: Yes Stairs assistance: Min assist Stair Management: Two rails Number of Stairs: 4 General stair comments: min cuing for technique; encouraged for WBing bilat UEs to offset pressure in R LE as needed.  Min cuing for bilat UE hand position on rails when descending to maximize balance/safety  Wheelchair Mobility    Modified Rankin (Stroke Patients Only)       Balance Overall balance assessment: Needs assistance Sitting-balance support: Feet supported;No upper extremity supported Sitting balance-Leahy Scale: Good     Standing balance support: Bilateral upper extremity supported Standing balance-Leahy Scale: Fair                               Pertinent Vitals/Pain Pain Assessment: No/denies pain    Home Living Family/patient expects to be discharged to:: Private residence Living Arrangements: Spouse/significant other Available Help at Discharge: Family Type of Home: House Home Access: Stairs to enter Entrance Stairs-Rails: Left;Right;Can reach both Secretary/administrator of Steps: 3 Home Layout: One level Home Equipment: Environmental consultant - 2 wheels;Bedside commode      Prior Function Level of Independence: Independent         Comments: Indep with ADLs, household and community mobilization; does endorse 2-3 additional falls within  previous six months.  Caregiver for husband.     Hand Dominance        Extremity/Trunk Assessment   Upper Extremity Assessment Upper Extremity Assessment: Overall WFL for tasks assessed    Lower Extremity Assessment Lower Extremity  Assessment: (R hip grossly 3-/5, knee immobilized, R ankle 4/5)       Communication   Communication: No difficulties  Cognition Arousal/Alertness: Awake/alert Behavior During Therapy: WFL for tasks assessed/performed Overall Cognitive Status: Within Functional Limits for tasks assessed                                        General Comments      Exercises Other Exercises Other Exercises: Toilet transfer, SPT with RW, cga/min assist; sit/stand with RW, cga/min assist.  Able to continently empty bladder; RN informed/aware. Other Exercises: Instructed in wearing schedule and donning/doffing R KI; patient voiced understanding, but question full comprehension.  Will continue to reinforce throughout stay.   Assessment/Plan    PT Assessment Patient needs continued PT services  PT Problem List Decreased strength;Decreased range of motion;Decreased activity tolerance;Decreased balance;Decreased mobility;Decreased coordination;Decreased knowledge of use of DME;Decreased cognition;Decreased safety awareness       PT Treatment Interventions DME instruction;Gait training;Stair training;Functional mobility training;Therapeutic exercise;Balance training;Therapeutic activities;Cognitive remediation;Patient/family education    PT Goals (Current goals can be found in the Care Plan section)  Acute Rehab PT Goals Patient Stated Goal: to try to go to the bathroom PT Goal Formulation: With patient Time For Goal Achievement: 09/26/17 Potential to Achieve Goals: Good    Frequency 7X/week   Barriers to discharge Decreased caregiver support      Co-evaluation               AM-PAC PT "6 Clicks" Daily Activity  Outcome Measure Difficulty turning over in bed (including adjusting bedclothes, sheets and blankets)?: A Little Difficulty moving from lying on back to sitting on the side of the bed? : A Little Difficulty sitting down on and standing up from a chair with arms (e.g.,  wheelchair, bedside commode, etc,.)?: A Little Help needed moving to and from a bed to chair (including a wheelchair)?: A Little Help needed walking in hospital room?: A Little Help needed climbing 3-5 steps with a railing? : A Little 6 Click Score: 18    End of Session Equipment Utilized During Treatment: Gait belt Activity Tolerance: Patient tolerated treatment well Patient left: in chair;with chair alarm set;with call bell/phone within reach Nurse Communication: Mobility status PT Visit Diagnosis: Difficulty in walking, not elsewhere classified (R26.2);Muscle weakness (generalized) (M62.81)    Time: [id number] PT Time Calculation (min) (ACUTE ONLY): 51 min   Charges:   PT Evaluation $PT Eval Moderate Complexity: 1 Mod PT Treatments $Gait Training: 8-22 mins $Therapeutic Activity: 23-37 mins   PT G Codes:        Conall Vangorder H. Manson PasseyBrown, PT, DPT, NCS 09/12/17, 2:39 PM [phone]

## 2017-09-12 NOTE — Progress Notes (Signed)
[employer] [facility] [facility] [place] [facility]   PATIENT NAME: Elizabeth Shepard    MR#:  [id number]  DATE OF BIRTH:  [birth year]  SUBJECTIVE:   Pt. is here after a mechanical fall and noted to have right hip dislocation.  The right hip dislocation was reduced by orthopedic surgery.  Patient was still having significant pain and therefore was observed overnight.  Patient still has some significant pain on minimal ambulation.  Physical therapy recommended home health.  REVIEW OF SYSTEMS:    Review of Systems  Constitutional: Negative for chills and fever.  HENT: Negative for congestion and tinnitus.   Eyes: Negative for blurred vision and double vision.  Respiratory: Negative for cough, shortness of breath and wheezing.   Cardiovascular: Negative for chest pain, orthopnea and PND.  Gastrointestinal: Negative for abdominal pain, diarrhea, nausea and vomiting.  Genitourinary: Negative for dysuria and hematuria.  Neurological: Negative for dizziness, sensory change and focal weakness.  All other systems reviewed and are negative.   Nutrition: Heart Healthy/Carb control Tolerating Diet: Yes Tolerating PT: Home health.   DRUG ALLERGIES:   Allergies  Allergen Reactions  . Ciprofloxacin Itching and Rash    VITALS:  Blood pressure (!) 105/50, pulse 91, temperature 97.6 F (36.4 C), temperature source Oral, resp. rate 18, height 5\' 3"  (1.6 m), weight 77.1 kg (170 lb), SpO2 97 %.  PHYSICAL EXAMINATION:   Physical Exam  GENERAL:  77 y.o.-year-old patient lying in bed in no acute distress.  EYES: Pupils equal, round, reactive to light and accommodation. No scleral icterus. Extraocular muscles intact.  HEENT: Head atraumatic, normocephalic. Oropharynx and nasopharynx clear.  NECK:  Supple, no jugular venous distention. No thyroid enlargement, no tenderness.  LUNGS: Normal breath sounds bilaterally, no wheezing, rales, rhonchi. No use of accessory muscles of respiration.   CARDIOVASCULAR: S1, S2 normal. No murmurs, rubs, or gallops.  ABDOMEN: Soft, nontender, nondistended. Bowel sounds present. No organomegaly or mass.  EXTREMITIES: No cyanosis, clubbing or edema b/l. Right Leg in an immobilizer.    NEUROLOGIC: Cranial nerves II through XII are intact. No focal Motor or sensory deficits b/l.   PSYCHIATRIC: The patient is alert and oriented x 3.  SKIN: No obvious rash, lesion, or ulcer.    LABORATORY PANEL:   CBC Recent Labs  Lab 09/12/17 0513  WBC 9.3  HGB 13.8  HCT 40.5  PLT 356   ------------------------------------------------------------------------------------------------------------------  Chemistries  Recent Labs  Lab 09/12/17 0513  NA 135  K 4.5  CL 101  CO2 23  GLUCOSE 133*  BUN 24*  CREATININE 1.48*  CALCIUM 9.2   ------------------------------------------------------------------------------------------------------------------  Cardiac Enzymes No results for input(s): TROPONINI in the last 168 hours. ------------------------------------------------------------------------------------------------------------------  RADIOLOGY:  Dg Chest 1 View  Result Date: 09/11/2017 CLINICAL DATA:  Fall.  Right hip dislocation EXAM: CHEST  1 VIEW COMPARISON:  12/28/2015 FINDINGS: Heart size upper normal. Negative for heart failure. Lungs are clear without infiltrate or effusion. Mild atherosclerotic calcification aortic arch Retrocardiac density unchanged most compatible with hiatal hernia. Bilateral calcified breast implants IMPRESSION: No acute abnormality.  Probable hiatal hernia Electronically Signed   By: Marlan Palauharles  Clark M.D.   On: 09/11/2017 15:03   Dg Hip Unilat W Or Wo Pelvis 2-3 Views Right  Result Date: 09/11/2017 CLINICAL DATA:  Post reduction total-hip replacement dislocation on the right EXAM: DG HIP (WITH OR WITHOUT PELVIS) 2-3V RIGHT COMPARISON:  Study obtained earlier in the day FINDINGS: Frontal pelvis as well as frontal and  lateral right hip  images were obtained. There has been successful reduction of dislocation of the total hip prosthesis on the right. There are total hip prostheses bilaterally with prosthetic components appearing well-seated on current examination. No acute fracture or dislocation. No erosive change. Bones are osteoporotic. Note that there is moderate osteoporosis surrounding the acetabular components of each prosthesis. IMPRESSION: Successful reduction of total-hip replacement dislocation on the right. Prosthetic components bowel bilaterally appear well seated. No acute fracture or dislocation. No periarticular osteoporosis surrounding the acetabular component of each total hip prosthesis. Electronically Signed   By: Bretta Bang III M.D.   On: 09/11/2017 18:03   Dg Hip Unilat With Pelvis 2-3 Views Right  Result Date: 09/11/2017 CLINICAL DATA:  Fall EXAM: DG HIP (WITH OR WITHOUT PELVIS) 2-3V RIGHT COMPARISON:  12/28/2015 FINDINGS: Right hip replacement is dislocated superiorly.  No fracture. Left hip replacement in satisfactory alignment. Negative for pelvic fracture. Lumbar degenerative change and scoliosis IMPRESSION: Superior dislocation right hip prosthesis. Electronically Signed   By: Marlan Palau M.D.   On: 09/11/2017 15:02     ASSESSMENT AND PLAN:   77 year old female with past medical history of Hypothyroidism, HTN, Hyperlipidemia, hx of UTI, Depression presented to the hospital after a mechanical fall and noted to have a right hip dislocation.  1.  Right hip pain-secondary to right hip dislocation.  The hip was reduced by orthopedics in the ER yesterday.  Patient was having significant pain therefore was observed in the hospital overnight. -Continue Norco for pain control.  Seen by physical therapy and they recommend home health services.  Patient likely can be discharged by tomorrow.  2.  Essential hypertension-continue lisinopril.  3.  Hypothyroidism-continue Synthroid.   Next  4.  Hyperlipidemia-continue atorvastatin.  5.  GERD-continue Protonix.  All the records are reviewed and case discussed with Care Management/Social Worker. Management plans discussed with the patient, family and they are in agreement.  CODE STATUS: Full code  DVT Prophylaxis: Lovenox  TOTAL TIME TAKING CARE OF THIS PATIENT: 30 minutes.   POSSIBLE D/C IN 1-2 DAYS, DEPENDING ON CLINICAL CONDITION.   Houston Siren M.D on 09/12/2017 at 4:01 PM  Between 7am to 6pm - Pager - [phone]  After 6pm go to [email or web address] - Therapist, [facility] Hospital[facility]  Office  [phone]  CC: Primary care physician; Raynelle Bring

## 2017-09-13 LAB — BASIC METABOLIC PANEL
ANION GAP: 9 (ref 5–15)
BUN: 18 mg/dL (ref 6–20)
CALCIUM: 8.1 mg/dL — AB (ref 8.9–10.3)
CO2: 21 mmol/L — AB (ref 22–32)
Chloride: 107 mmol/L (ref 101–111)
Creatinine, Ser: 0.81 mg/dL (ref 0.44–1.00)
GFR calc Af Amer: >60 mL/min (ref >60)
GFR calc non Af Amer: >60 mL/min (ref >60)
GLUCOSE: 116 mg/dL — AB (ref 65–99)
POTASSIUM: 3.6 mmol/L (ref 3.5–5.1)
Sodium: 137 mmol/L (ref 135–145)

## 2017-09-13 NOTE — Progress Notes (Signed)
Physical Therapy Treatment Patient Details Name: Elizabeth Shepard MRN: [id number] DOB: [birth year] Today's Date: 09/13/2017    History of Present Illness presented to ER secondary to fall with R hip pain; admitted with R hip dislocation (THR x8 years prior), status post closed reduction in ER (09/11/17).    PT Comments    Pt transitioned out of bed with verbal cues.  While she voices concern over no handrails at home, she was able to do with bed flat and without assist.  Discussed purchasing some for home if she remains concerned.  She was able to stand and ambulate to/from rehab gym with walker and min guard.  Staoir training with bilateral rails and min guard.  While she needed a seated rest before and after stair training she stated she has a smaller one level condo and the distance she walked was no further than she would have to at home.  She voiced concern over going home.  While she does voice some fear, she demonstrated safe mobility without LOB or buckling.  She does however need verbal cues at times for hand placements.  Stated her husband uses a walker and is not able to physically help her.  States on lives in [place] and is unable to help.  Stated she would go home with EMS.    Discussed hiring outside help if needed for light housekeeping etc during her recovery.   Follow Up Recommendations  Home health PT     Equipment Recommendations       Recommendations for Other Services       Precautions / Restrictions Precautions Precautions: Fall;Posterior Hip Restrictions Weight Bearing Restrictions: Yes RLE Weight Bearing: Weight bearing as tolerated    Mobility  Bed Mobility Overal bed mobility: Needs Assistance Bed Mobility: Supine to Sit     Supine to sit: Supervision     General bed mobility comments: cuing for adherence to posterior THPs  Transfers Overall transfer level: Needs assistance Equipment used: Rolling walker (2 wheeled) Transfers: Sit to/from Stand Sit  to Stand: Min guard            Ambulation/Gait     Assistive device: Rolling walker (2 wheeled) Gait Pattern/deviations: Step-to pattern         Stairs Stairs: Yes   Stair Management: Two rails Number of Stairs: 4 General stair comments: min verbal cues and encouragement  Wheelchair Mobility    Modified Rankin (Stroke Patients Only)       Balance Overall balance assessment: Needs assistance Sitting-balance support: Feet supported;No upper extremity supported Sitting balance-Leahy Scale: Good     Standing balance support: Bilateral upper extremity supported Standing balance-Leahy Scale: Fair                              Cognition Arousal/Alertness: Awake/alert Behavior During Therapy: WFL for tasks assessed/performed Overall Cognitive Status: Within Functional Limits for tasks assessed                                 General Comments: fearful       Exercises      General Comments        Pertinent Vitals/Pain Pain Assessment: 0-10 Pain Score: 2  Pain Descriptors / Indicators: Sore    Home Living  Prior Function            PT Goals (current goals can now be found in the care plan section) Progress towards PT goals: Progressing toward goals    Frequency    7X/week      PT Plan Current plan remains appropriate    Co-evaluation              AM-PAC PT "6 Clicks" Daily Activity  Outcome Measure  Difficulty turning over in bed (including adjusting bedclothes, sheets and blankets)?: A Little Difficulty moving from lying on back to sitting on the side of the bed? : A Little Difficulty sitting down on and standing up from a chair with arms (e.g., wheelchair, bedside commode, etc,.)?: A Little Help needed moving to and from a bed to chair (including a wheelchair)?: A Little Help needed walking in hospital room?: A Little Help needed climbing 3-5 steps with a railing? : A  Little 6 Click Score: 18    End of Session Equipment Utilized During Treatment: Gait belt Activity Tolerance: Patient tolerated treatment well Patient left: in chair;with chair alarm set;with call bell/phone within reach Nurse Communication: Mobility status       Time: [id number] PT Time Calculation (min) (ACUTE ONLY): 16 min  Charges:  $Gait Training: 8-22 mins                    G Codes:      Danielle DessSarah Perl Folmar, PTA 09/13/17, 12:08 PM

## 2017-09-13 NOTE — Progress Notes (Signed)
Patient ID: Elizabeth Shepard, female   DOB: August 07, 1940, 77 y.o.   MRN: [id number]   Face to face evaluation  Nursing staff, myself and patient present.  Diagnosis: Right hip dislocation and reduction by orthopedic surgery.  Plan: I will go in the room every day to try to discharge her.  The patient states that she will only go home if she is able to home health on the same day that she is discharged.  I told the patient that normally that this does not happen.  Usually they come out the next day.  I will speak with the care manager tomorrow about setting up home health on the day of discharge.  Hopefully we can set this up for tomorrow.  The patient is unable to pay for 24 7 care.  The patient did well with physical therapy and does not qualify for rehab.  The only disposition will be home with home health.  The patient will probably protest her discharge tomorrow.  Time spent trying to discuss discharge with the patient 30 minutes.  Dr. Alford Highlandichard Mindy Behnken.

## 2017-09-13 NOTE — Progress Notes (Signed)
Patient ID: Elizabeth Shepard, female   DOB: September 12, 1940, 77 y.o.   MRN: [id number]  [facility] PROGRESS NOTE  Elizabeth Shepard DUK:[id number] DOB: September 12, 1940 DOA: 09/11/2017 PCP: [facility]  HPI/Subjective: Patient still having quite a bit of pain in her hip.  She was able to work with physical therapy yesterday and today.  I saw the patient this morning.  Objective: Vitals:   09/13/17 0739 09/13/17 1026  BP: 123/67 138/67  Pulse: 89   Resp: 18   Temp: 98.7 F (37.1 C)   SpO2: 93%     Filed Weights   09/11/17 1248  Weight: 77.1 kg (170 lb)    ROS: Review of Systems  Constitutional: Negative for chills and fever.  Eyes: Negative for blurred vision.  Respiratory: Negative for cough and shortness of breath.   Cardiovascular: Negative for chest pain.  Gastrointestinal: Negative for abdominal pain, constipation, diarrhea, nausea and vomiting.  Genitourinary: Negative for dysuria.  Musculoskeletal: Positive for joint pain.  Neurological: Negative for dizziness and headaches.   Exam: Physical Exam  Constitutional: She is oriented to person, place, and time.  HENT:  Nose: No mucosal edema.  Mouth/Throat: No oropharyngeal exudate or posterior oropharyngeal edema.  Eyes: Pupils are equal, round, and reactive to light. Conjunctivae, EOM and lids are normal.  Neck: No JVD present. Carotid bruit is not present. No edema present. No thyroid mass and no thyromegaly present.  Cardiovascular: S1 normal and S2 normal. Exam reveals no gallop.  No murmur heard. Pulses:      Dorsalis pedis pulses are 2+ on the right side, and 2+ on the left side.  Respiratory: No respiratory distress. She has no wheezes. She has no rhonchi. She has no rales.  GI: Soft. Bowel sounds are normal. There is no tenderness.  Musculoskeletal:       Right ankle: She exhibits no swelling.       Left ankle: She exhibits no swelling.  Lymphadenopathy:    She has no cervical adenopathy.  Neurological: She  is alert and oriented to person, place, and time. No cranial nerve deficit.  Skin: Skin is warm. No rash noted. Nails show no clubbing.  Psychiatric: She has a normal mood and affect.      Data Reviewed: Basic Metabolic Panel: Recent Labs  Lab 09/11/17 1329 09/12/17 0513 09/13/17 0334  NA 135 135 137  K 3.7 4.5 3.6  CL 98* 101 107  CO2 23 23 21*  GLUCOSE 112* 133* 116*  BUN 18 24* 18  CREATININE 0.98 1.48* 0.81  CALCIUM 9.7 9.2 8.1*   CBC: Recent Labs  Lab 09/11/17 1329 09/12/17 0513  WBC 7.4 9.3  NEUTROABS 6.1  --   HGB 15.0 13.8  HCT 44.9 40.5  MCV 97.7 97.9  PLT 380 356     Studies: Dg Chest 1 View  Result Date: 09/11/2017 CLINICAL DATA:  Fall.  Right hip dislocation EXAM: CHEST  1 VIEW COMPARISON:  12/28/2015 FINDINGS: Heart size upper normal. Negative for heart failure. Lungs are clear without infiltrate or effusion. Mild atherosclerotic calcification aortic arch Retrocardiac density unchanged most compatible with hiatal hernia. Bilateral calcified breast implants IMPRESSION: No acute abnormality.  Probable hiatal hernia Electronically Signed   By: Marlan Palauharles  Clark M.D.   On: 09/11/2017 15:03   Dg Hip Unilat W Or Wo Pelvis 2-3 Views Right  Result Date: 09/11/2017 CLINICAL DATA:  Post reduction total-hip replacement dislocation on the right EXAM: DG HIP (WITH OR WITHOUT PELVIS) 2-3V RIGHT COMPARISON:  Study obtained earlier in the day FINDINGS: Frontal pelvis as well as frontal and lateral right hip images were obtained. There has been successful reduction of dislocation of the total hip prosthesis on the right. There are total hip prostheses bilaterally with prosthetic components appearing well-seated on current examination. No acute fracture or dislocation. No erosive change. Bones are osteoporotic. Note that there is moderate osteoporosis surrounding the acetabular components of each prosthesis. IMPRESSION: Successful reduction of total-hip replacement dislocation on  the right. Prosthetic components bowel bilaterally appear well seated. No acute fracture or dislocation. No periarticular osteoporosis surrounding the acetabular component of each total hip prosthesis. Electronically Signed   By: Bretta Bang III M.D.   On: 09/11/2017 18:03   Dg Hip Unilat With Pelvis 2-3 Views Right  Result Date: 09/11/2017 CLINICAL DATA:  Fall EXAM: DG HIP (WITH OR WITHOUT PELVIS) 2-3V RIGHT COMPARISON:  12/28/2015 FINDINGS: Right hip replacement is dislocated superiorly.  No fracture. Left hip replacement in satisfactory alignment. Negative for pelvic fracture. Lumbar degenerative change and scoliosis IMPRESSION: Superior dislocation right hip prosthesis. Electronically Signed   By: Marlan Palau M.D.   On: 09/11/2017 15:02    Scheduled Meds: . atorvastatin  40 mg Oral Daily  . enoxaparin (LOVENOX) injection  40 mg Subcutaneous Q24H  . levothyroxine  25 mcg Oral QAC breakfast  . lisinopril  10 mg Oral Daily  . loratadine  10 mg Oral Daily  . pantoprazole  40 mg Oral Daily  . sertraline  100 mg Oral QHS  . sodium chloride flush  3 mL Intravenous Q12H  . sucralfate  1 g Oral BID   Continuous Infusions: . sodium chloride      Assessment/Plan:  1. Right hip dislocation and right hip pain.  The hip was reduced in the ER by orthopedic surgery.  Pain control with oral pain medications.  Physical therapy recommending home with home health. 2. Essential hypertension on lisinopril 3. Hypothyroidism unspecified on Synthroid 4. Hyperlipidemia unspecified on atorvastatin 5. GERD on Protonix  Code Status:     Code Status Orders  (From admission, onward)        Start     Ordered   09/11/17 2018  Full code  Continuous     09/11/17 2017    Code Status History    Date Active Date Inactive Code Status Order ID Comments User Context   12/29/2015 0106 01/03/2016 1704 Full Code [id number]  Oralia Manis, MD Inpatient   07/04/2015 1848 07/07/2015 2124 Full Code [id number]   Alford Highland, MD ED     Disposition Plan: And will go into the room on a daily basis and try to send the patient home.  Consultants:  Seen in the ER by orthopedic surgery  Time spent:   20 minutes earlier  [employer]

## 2017-09-13 NOTE — Care Management Obs Status (Signed)
MEDICARE OBSERVATION STATUS NOTIFICATION   Patient Details  Name: Elizabeth Shepard MRN: [id number] Date of Birth: [birth year]   Medicare Observation Status Notification Given:  Yes    Gwenette GreetBrenda S Khayla Koppenhaver, RN 09/13/2017, 9:08 AM

## 2017-09-13 NOTE — Care Management (Signed)
Still questioning her discharge for today. Explained observation letter. Letter issued.  Spoke with Dr. Renae GlossWieting. Will ask physical therapy to re-evaluate. Gwenette GreetBrenda S M[employer]is Wetsel RN MSN CCM Care Management [phone]

## 2017-09-14 MED ORDER — HYDROCODONE-ACETAMINOPHEN 5-325 MG PO TABS: 1.0000 | ORAL_TABLET | ORAL | 0 refills | Status: DC | PRN

## 2017-09-14 MED ORDER — PANTOPRAZOLE SODIUM 40 MG PO TBEC: 40.0000 mg | DELAYED_RELEASE_TABLET | Freq: Every day | ORAL | Status: DC

## 2017-09-14 MED ORDER — DOCUSATE SODIUM 100 MG PO CAPS: 100.0000 mg | ORAL_CAPSULE | Freq: Two times a day (BID) | ORAL | 0 refills | Status: DC

## 2017-09-14 MED ORDER — SENNA 8.6 MG PO TABS: 1.0000 | ORAL_TABLET | Freq: Every day | ORAL | 0 refills | Status: DC

## 2017-09-14 NOTE — Discharge Instructions (Signed)
Hip Dislocation °Hip dislocation is when the bones in your hip joint move out of place. To treat this, your doctor must move your bones back into place (reduction). This condition is an emergency. If you think you have dislocated your hip, do not move. Get medical help right away. °Symptoms of a dislocated hip may include: °· Very bad pain in your hip area. Pain may get worse when you move or when you try to use your hip to support (bear) your weight. °· Not being able to move the hip. °· Having the leg of the dislocated hip looking shorter than the other leg. °· Inward turning of the foot on the side of the dislocated hip. °· Loss of feeling in your lower leg, foot, or ankle. ° °Follow these instructions at home: °If you have a splint: °· Do not put pressure on any part of the splint until it is fully hardened. This may take many hours. °· Wear the splint as told by your doctor. Remove it only as told by your doctor. °· Loosen the splint if your toes tingle, get numb, or turn cold and blue. °· Do not let your splint get wet if it is not waterproof. °? Do not take baths, swim, or use a hot tub until your doctor says it is okay. Ask your doctor if you can take showers. You may only be allowed to take sponge baths for bathing. °? If your splint is not waterproof, cover it with a watertight plastic bag when you take a bath or a shower. °· Keep the splint clean. °Managing pain, stiffness, and swelling °· If directed, put ice on the injured area: °? Put ice in a plastic bag. °? Place a towel between your skin and the bag. °? Leave the ice on for 20 minutes, 2-3 times a day. °· Wear compression stockings or wraps as told by your doctor. °· Move your toes often to avoid stiffness and to lessen swelling. °Driving °· Do not drive or use heavy machinery while taking prescription pain medicine, or as told by your doctor. °· Ask your doctor when it is safe to drive if you have a splint on your hip. °Activity °· Return to your  normal activities as told by your doctor. Ask your doctor what activities are safe for you. °· If physical therapy was prescribed, do exercises as told by your doctor. °Safety °· Do not use your hip to support your body weight until your doctor says that you can. Use crutches or a walker as told by your doctor. °General instructions °· Do not use any tobacco products, such as cigarettes, chewing tobacco, and e-cigarettes. Tobacco can delay bone healing. If you need help quitting, ask your doctor. °· Take over-the-counter and prescription medicines only as told by your doctor. °· Keep all follow-up visits as told by your doctor. This is important. °How is this prevented? °· If you have trouble walking or keeping your balance, try using a cane or a walker. If you feel unstable, sit down right away. °· Exercise regularly, as told by your doctor. °· Warm up and stretch before being active. °· Cool down and stretch after being active. °Contact a doctor if: °· You have pain that gets worse. °· You have pain that does not get better with medicine. °· You have swelling in your hip area or your leg. °· You have red skin on your hip area or your leg. °· You cannot move any part of your   hip or leg. °· You feel tingling in any part of your hip, leg, or foot. °Get help right away if: °· You feel like your hip has dislocated again. °· You have very bad pain in your hip or groin. °· You have numbness or weakness in your leg. °If you have symptoms of a hip dislocation, do not wait to see if the symptoms will go away. Get medical help right away. Call your local emergency services (911 in the [place] Do not drive yourself to the hospital. °This information is not intended to replace advice given to you by your health care provider. Make sure you discuss any questions you have with your health care provider. °Document Released: 01/31/2011 Document Revised: 11/09/2015 Document Reviewed: 01/03/2015 °Elsevier Interactive Patient  Education © 2018 [employer] Inc. ° °

## 2017-09-14 NOTE — Care Management Note (Signed)
Case Management Note  Patient Details  Name: Elizabeth Shepard MRN: [id number] Date of Birth: May 18, 1941  Subjective/Objective:    Patient reluctant to leave. Wanted to file an appeal. I explained to her that under observation that she could not file an appeal. She finally agreed to transport home by EMS with home health through [facility]. DME has been delivered. EMS called. Medical necessity completed. [facility] notified of discharge and requested home health vist tomorrow. Per Dr. Hilton SinclairWeiting, he spoke with the spouse and he is able to help her a small amount. Patient to have a neighbor come and get equipment                [facility]  Expected Discharge Date:  09/14/17               Expected Discharge Plan:  Home w Home Health Services  In-House Referral:     Discharge planning Services  CM Consult  Post Acute Care Choice:  Durable Medical Equipment, Home Health Choice offered to:  Patient  DME Arranged:  Bedside commode, Walker rolling DME Agency:  [facility]  HH Arranged:  RN, PT, OT, Nurse's Aide HH Agency:  [facility]  Status of Service:  Completed, signed off  If discussed at Long Length of Stay Meetings, dates discussed:    Additional Comments:  Marily MemosLisa M Vinson Tietze, RN 09/14/2017, 1:27 PM

## 2017-09-14 NOTE — Discharge Summary (Signed)
[facility] - [place] [facility]   PATIENT NAME: Elizabeth Shepard    MR#:  [id number]  DATE OF BIRTH:  12-10-1940  DATE OF ADMISSION:  09/11/2017 ADMITTING PHYSICIAN: Shaune Pollack, MD  DATE OF DISCHARGE: 09/14/2017  PRIMARY CARE PHYSICIAN: [facility]    ADMISSION DIAGNOSIS:  Dislocation of right hip, initial encounter (HCC) [S73.004A]  DISCHARGE DIAGNOSIS:  Active Problems:   Anterior dislocation of right hip (HCC)   SECONDARY DIAGNOSIS:   Past Medical History:  Diagnosis Date  . Depression   . Generalized pain   . Hypercholesteremia   . Hypertension   . Hypothyroidism   . Thyroid disease   . UTI (lower urinary tract infection)     HOSPITAL COURSE:   1.  Right hip dislocation and right hip pain.  The hip was reduced in the ER by orthopedic surgery Dr. Gardiner Fanti.  The patient was brought in as an observation given pain control with oral medications.  Physical therapy recommended home with home health.  Dr. Cherlynn Kaiser tried to discharge her on Friday.  I tried to discharge her yesterday.  The patient still very concerned about going home at this point.  She did well enough with physical therapy where they recommend home with home health.  As per nursing staff, she was able to get up on her own.  Home health set up.  Oral pain medications for 4 days prescribed.   Spoke with husband on the phone and he states he will help out at home.  2.  Essential hypertension on lisinopril 3.  Hypothyroidism unspecified on Synthroid 4.  Hyperlipidemia unspecified on atorvastatin 5 GERD on Protonix  DISCHARGE CONDITIONS:   Fair  CONSULTS OBTAINED:  Orthopedic surgery  DRUG ALLERGIES:   Allergies  Allergen Reactions  . Ciprofloxacin Itching and Rash    DISCHARGE MEDICATIONS:   Allergies as of 09/14/2017      Reactions   Ciprofloxacin Itching, Rash      Medication List    STOP taking these medications   ferrous sulfate 325 (65 FE) MG tablet   lidocaine  5 % Commonly known as:  LIDODERM   methocarbamol 500 MG tablet Commonly known as:  ROBAXIN   oxybutynin 5 MG 24 hr tablet Commonly known as:  DITROPAN-XL   traMADol 50 MG tablet Commonly known as:  ULTRAM     TAKE these medications   acetaminophen 500 MG tablet Commonly known as:  TYLENOL Take 1,000 mg by mouth every 6 (six) hours as needed.   atorvastatin 40 MG tablet Commonly known as:  LIPITOR Take 40 mg by mouth daily.   cetirizine 10 MG tablet Commonly known as:  ZYRTEC Take 10 mg by mouth daily as needed for allergies.   conjugated estrogens vaginal cream Commonly known as:  PREMARIN Place 1 Applicatorful vaginally 2 (two) times a week.   Cranberry 12600 MG Caps Take 1 capsule by mouth daily.   docusate sodium 100 MG capsule Commonly known as:  COLACE Take 1 capsule (100 mg total) by mouth 2 (two) times daily.   HYDROcodone-acetaminophen 5-325 MG tablet Commonly known as:  NORCO/VICODIN Take 1 tablet by mouth every 4 (four) hours as needed for moderate pain.   levothyroxine 25 MCG tablet Commonly known as:  SYNTHROID, LEVOTHROID Take 25 mcg by mouth daily before breakfast.   lisinopril 10 MG tablet Commonly known as:  PRINIVIL,ZESTRIL Take 10 mg by mouth daily.   Melatonin 5 MG Tabs Take by mouth.   multivitamin  with minerals Tabs tablet Take 1 tablet by mouth daily.   pantoprazole 40 MG tablet Commonly known as:  PROTONIX Take 1 tablet (40 mg total) by mouth daily.   senna 8.6 MG Tabs tablet Commonly known as:  SENOKOT Take 1 tablet (8.6 mg total) by mouth daily.   sertraline 100 MG tablet Commonly known as:  ZOLOFT Take 100 mg by mouth at bedtime.   sucralfate 1 g tablet Commonly known as:  CARAFATE Take 1 g by mouth 2 (two) times daily.            Durable Medical Equipment  (From admission, onward)        Start     Ordered   09/12/17 1539  For home use only DME Walker rolling  Once    Question:  Patient needs a walker to  treat with the following condition  Answer:  Weakness   09/12/17 1538   09/12/17 1539  For home use only DME Bedside commode  Once    Question:  Patient needs a bedside commode to treat with the following condition  Answer:  Weakness   09/12/17 1538       DISCHARGE INSTRUCTIONS:    Follow-up PMD 1 week Follow-up orthopedic surgery 2 weeks Home health set up  If you experience worsening of your admission symptoms, develop shortness of breath, life threatening emergency, suicidal or homicidal thoughts you must seek medical attention immediately by calling 911 or calling your MD immediately  if symptoms less severe.  You Must read complete instructions/literature along with all the possible adverse reactions/side effects for all the Medicines you take and that have been prescribed to you. Take any new Medicines after you have completely understood and accept all the possible adverse reactions/side effects.   Please note  You were cared for by a hospitalist during your hospital stay. If you have any questions about your discharge medications or the care you received while you were in the hospital after you are discharged, you can call the unit and asked to speak with the hospitalist on call if the hospitalist that took care of you is not available. Once you are discharged, your primary care physician will handle any further medical issues. Please note that NO REFILLS for any discharge medications will be authorized once you are discharged, as it is imperative that you return to your primary care physician (or establish a relationship with a primary care physician if you do not have one) for your aftercare needs so that they can reassess your need for medications and monitor your lab values.    Today   CHIEF COMPLAINT:   Chief Complaint  Patient presents with  . Fall  . Hip Pain    HISTORY OF PRESENT ILLNESS:  Elizabeth Shepard  is a 77 y.o. female came in after fall and found to have a  right hip dislocation   VITAL SIGNS:  Blood pressure 134/72, pulse 99, temperature 98 F (36.7 C), temperature source Oral, resp. rate 18, height 5\' 3"  (1.6 m), weight 77.1 kg (170 lb), SpO2 93 %.   PHYSICAL EXAMINATION:  GENERAL:  77 y.o.-year-old patient lying in the bed with no acute distress.  EYES: Pupils equal, round, reactive to light and accommodation. No scleral icterus. Extraocular muscles intact.  HEENT: Head atraumatic, normocephalic. Oropharynx and nasopharynx clear.  NECK:  Supple, no jugular venous distention. No thyroid enlargement, no tenderness.  LUNGS: Normal breath sounds bilaterally, no wheezing, rales,rhonchi or crepitation. No use of accessory  muscles of respiration.  CARDIOVASCULAR: S1, S2 normal. No murmurs, rubs, or gallops.  ABDOMEN: Soft, non-tender, non-distended. Bowel sounds present. No organomegaly or mass.  EXTREMITIES: No pedal edema, cyanosis, or clubbing.  NEUROLOGIC: Cranial nerves II through XII are intact. Sensation intact. Gait not checked.  PSYCHIATRIC: The patient is alert and oriented x 3.  SKIN:  scab on the right eye from where she fell with her glasses.  DATA REVIEW:   CBC Recent Labs  Lab 09/12/17 0513  WBC 9.3  HGB 13.8  HCT 40.5  PLT 356    Chemistries  Recent Labs  Lab 09/13/17 0334  NA 137  K 3.6  CL 107  CO2 21*  GLUCOSE 116*  BUN 18  CREATININE 0.81  CALCIUM 8.1*    Management plans discussed with the patient, family and they are in agreement.  CODE STATUS:     Code Status Orders  (From admission, onward)        Start     Ordered   09/11/17 2018  Full code  Continuous     09/11/17 2017    Code Status History    Date Active Date Inactive Code Status Order ID Comments User Context   12/29/2015 0106 01/03/2016 1704 Full Code [id number]  Oralia ManisWillis, David, MD Inpatient   07/04/2015 1848 07/07/2015 2124 Full Code [id number]  Alford HighlandWieting, Ruthy Forry, MD ED      TOTAL TIME TAKING CARE OF THIS PATIENT: 35 minutes.     Alford Highlandichard Posey Petrik M.D on 09/14/2017 at 1:35 PM  Between 7am to 6pm - Pager - [phone]  After 6pm go to [email or web address] - password [email or web address]  [facility] Office  [phone]  CC: Primary care physician; Dr Leotis ShamesJasmine Singh

## 2017-09-14 NOTE — Progress Notes (Signed)
DISCHARGE NOTE:  Pt given discharge instructions and prescriptions. Pt verbalized understanding.  Pts neighbor picking up bedside and walker. EMS here to transport pt home.

## 2017-09-17 NOTE — Progress Notes (Signed)
Patient ID: Elizabeth Shepard, female   DOB: 08/06/1940, 77 y.o.   MRN: [id number]  Norco 1 tablet every 8 hours as needed #10 no refills Patient calling in with more pain. Taking her prescription and only has 3 pills left.  Does not have a follow-up appointment until a few more days.  Dr. Alford Highlandichard Tavarus [place]

## 2018-12-13 ENCOUNTER — Encounter: Payer: Self-pay | Admitting: Emergency Medicine

## 2018-12-13 ENCOUNTER — Other Ambulatory Visit: Payer: Self-pay

## 2018-12-13 ENCOUNTER — Inpatient Hospital Stay
Admission: EM | Admit: 2018-12-13 | Discharge: 2018-12-15 | DRG: 690 | Disposition: A | Discharge status: Home Health | Payer: Medicare Other | Attending: Internal Medicine | Admitting: Internal Medicine

## 2018-12-13 DIAGNOSIS — R531 Weakness: Secondary | ICD-10-CM

## 2018-12-13 DIAGNOSIS — Z87440 Personal history of urinary (tract) infections: Secondary | ICD-10-CM

## 2018-12-13 DIAGNOSIS — E860 Dehydration: Secondary | ICD-10-CM | POA: Diagnosis present

## 2018-12-13 DIAGNOSIS — B372 Candidiasis of skin and nail: Secondary | ICD-10-CM | POA: Diagnosis present

## 2018-12-13 DIAGNOSIS — F329 Major depressive disorder, single episode, unspecified: Secondary | ICD-10-CM | POA: Diagnosis present

## 2018-12-13 DIAGNOSIS — Z833 Family history of diabetes mellitus: Secondary | ICD-10-CM

## 2018-12-13 DIAGNOSIS — M545 Low back pain: Secondary | ICD-10-CM | POA: Diagnosis present

## 2018-12-13 DIAGNOSIS — Z1159 Encounter for screening for other viral diseases: Secondary | ICD-10-CM

## 2018-12-13 DIAGNOSIS — R262 Difficulty in walking, not elsewhere classified: Secondary | ICD-10-CM | POA: Diagnosis present

## 2018-12-13 DIAGNOSIS — N3090 Cystitis, unspecified without hematuria: Secondary | ICD-10-CM

## 2018-12-13 DIAGNOSIS — Z8249 Family history of ischemic heart disease and other diseases of the circulatory system: Secondary | ICD-10-CM

## 2018-12-13 DIAGNOSIS — R5383 Other fatigue: Secondary | ICD-10-CM

## 2018-12-13 DIAGNOSIS — R5381 Other malaise: Secondary | ICD-10-CM | POA: Diagnosis present

## 2018-12-13 DIAGNOSIS — E039 Hypothyroidism, unspecified: Secondary | ICD-10-CM | POA: Diagnosis present

## 2018-12-13 DIAGNOSIS — G8929 Other chronic pain: Secondary | ICD-10-CM | POA: Diagnosis present

## 2018-12-13 DIAGNOSIS — N390 Urinary tract infection, site not specified: Principal | ICD-10-CM | POA: Diagnosis present

## 2018-12-13 DIAGNOSIS — I10 Essential (primary) hypertension: Secondary | ICD-10-CM | POA: Diagnosis present

## 2018-12-13 DIAGNOSIS — Z79890 Hormone replacement therapy (postmenopausal): Secondary | ICD-10-CM

## 2018-12-13 DIAGNOSIS — E785 Hyperlipidemia, unspecified: Secondary | ICD-10-CM | POA: Diagnosis present

## 2018-12-13 LAB — URINALYSIS, COMPLETE (UACMP) WITH MICROSCOPIC
Bilirubin Urine: NEGATIVE
Glucose, UA: NEGATIVE mg/dL
Hgb urine dipstick: NEGATIVE
Ketones, ur: NEGATIVE mg/dL
Nitrite: NEGATIVE
Protein, ur: NEGATIVE mg/dL
Specific Gravity, Urine: 1.011 (ref 1.005–1.030)
WBC, UA: >50 WBC/hpf — ABNORMAL HIGH (ref 0–5)
pH: 6 (ref 5.0–8.0)

## 2018-12-13 LAB — BASIC METABOLIC PANEL
Anion gap: 11 (ref 5–15)
BUN: 18 mg/dL (ref 8–23)
CO2: 22 mmol/L (ref 22–32)
Calcium: 8.6 mg/dL — ABNORMAL LOW (ref 8.9–10.3)
Chloride: 103 mmol/L (ref 98–111)
Creatinine, Ser: 0.8 mg/dL (ref 0.44–1.00)
GFR calc Af Amer: >60 mL/min (ref >60)
GFR calc non Af Amer: >60 mL/min (ref >60)
Glucose, Bld: 132 mg/dL — ABNORMAL HIGH (ref 70–99)
Potassium: 3.8 mmol/L (ref 3.5–5.1)
Sodium: 136 mmol/L (ref 135–145)

## 2018-12-13 LAB — TSH: TSH: 4.528 u[IU]/mL — ABNORMAL HIGH (ref 0.350–4.500)

## 2018-12-13 LAB — CBC
HCT: 38.8 % (ref 36.0–46.0)
Hemoglobin: 12.7 g/dL (ref 12.0–15.0)
MCH: 31.1 pg (ref 26.0–34.0)
MCHC: 32.7 g/dL (ref 30.0–36.0)
MCV: 94.9 fL (ref 80.0–100.0)
Platelets: 329 10*3/uL (ref 150–400)
RBC: 4.09 MIL/uL (ref 3.87–5.11)
RDW: 12.4 % (ref 11.5–15.5)
WBC: 9.8 10*3/uL (ref 4.0–10.5)
nRBC: 0 % (ref 0.0–0.2)

## 2018-12-13 LAB — SARS CORONAVIRUS 2 BY RT PCR (HOSPITAL ORDER, PERFORMED IN ~~LOC~~ HOSPITAL LAB): SARS Coronavirus 2: NEGATIVE

## 2018-12-13 MED ORDER — ENOXAPARIN SODIUM 40 MG/0.4ML ~~LOC~~ SOLN
40.0000 mg | SUBCUTANEOUS | Status: DC
  Administered 2018-12-13 – 2018-12-14 (×2): 40 mg via SUBCUTANEOUS
  Filled 2018-12-13 (×2): qty 0.4

## 2018-12-13 MED ORDER — LISINOPRIL 10 MG PO TABS
10.0000 mg | ORAL_TABLET | Freq: Every day | ORAL | Status: DC
  Administered 2018-12-13 – 2018-12-15 (×3): 10 mg via ORAL
  Filled 2018-12-13 (×3): qty 1

## 2018-12-13 MED ORDER — LORAZEPAM 2 MG/ML IJ SOLN: 0.5000 mg | Freq: Four times a day (QID) | INTRAMUSCULAR | Status: DC | PRN

## 2018-12-13 MED ORDER — PANTOPRAZOLE SODIUM 40 MG PO TBEC
40.0000 mg | DELAYED_RELEASE_TABLET | Freq: Every day | ORAL | Status: DC
  Administered 2018-12-13 – 2018-12-15 (×3): 40 mg via ORAL
  Filled 2018-12-13 (×3): qty 1

## 2018-12-13 MED ORDER — ONDANSETRON HCL 4 MG/2ML IJ SOLN: 4.0000 mg | Freq: Four times a day (QID) | INTRAMUSCULAR | Status: DC | PRN

## 2018-12-13 MED ORDER — NYSTATIN 100000 UNIT/GM EX CREA
TOPICAL_CREAM | Freq: Two times a day (BID) | CUTANEOUS | Status: DC
  Administered 2018-12-13 – 2018-12-15 (×5): via TOPICAL
  Filled 2018-12-13 (×2): qty 15

## 2018-12-13 MED ORDER — SODIUM CHLORIDE 0.9 % IV SOLN
1.0000 g | Freq: Once | INTRAVENOUS | Status: AC
  Administered 2018-12-13: 1 g via INTRAVENOUS
  Filled 2018-12-13: qty 10

## 2018-12-13 MED ORDER — SODIUM CHLORIDE 0.9 % IV BOLUS
1000.0000 mL | Freq: Once | INTRAVENOUS | Status: AC
  Administered 2018-12-13: 1000 mL via INTRAVENOUS

## 2018-12-13 MED ORDER — ESCITALOPRAM OXALATE 10 MG PO TABS
10.0000 mg | ORAL_TABLET | Freq: Every day | ORAL | Status: DC
  Administered 2018-12-13 – 2018-12-15 (×3): 10 mg via ORAL
  Filled 2018-12-13 (×3): qty 1

## 2018-12-13 MED ORDER — DOCUSATE SODIUM 100 MG PO CAPS
100.0000 mg | ORAL_CAPSULE | Freq: Two times a day (BID) | ORAL | Status: DC
  Administered 2018-12-13 – 2018-12-15 (×5): 100 mg via ORAL
  Filled 2018-12-13 (×5): qty 1

## 2018-12-13 MED ORDER — ACETAMINOPHEN 650 MG RE SUPP: 650.0000 mg | Freq: Four times a day (QID) | RECTAL | Status: DC | PRN

## 2018-12-13 MED ORDER — ACETAMINOPHEN 325 MG PO TABS
650.0000 mg | ORAL_TABLET | Freq: Four times a day (QID) | ORAL | Status: DC | PRN
  Administered 2018-12-14 – 2018-12-15 (×2): 650 mg via ORAL
  Filled 2018-12-13 (×3): qty 2

## 2018-12-13 MED ORDER — ACETAMINOPHEN 325 MG PO TABS
650.0000 mg | ORAL_TABLET | Freq: Once | ORAL | Status: AC
  Administered 2018-12-13: 650 mg via ORAL

## 2018-12-13 MED ORDER — ACETAMINOPHEN 325 MG PO TABS
ORAL_TABLET | ORAL | Status: AC
  Administered 2018-12-13: 650 mg via ORAL
  Filled 2018-12-13: qty 2

## 2018-12-13 MED ORDER — ADULT MULTIVITAMIN W/MINERALS CH
1.0000 | ORAL_TABLET | Freq: Every day | ORAL | Status: DC
  Administered 2018-12-13 – 2018-12-15 (×3): 1 via ORAL
  Filled 2018-12-13 (×3): qty 1

## 2018-12-13 MED ORDER — ONDANSETRON HCL 4 MG PO TABS: 4.0000 mg | ORAL_TABLET | Freq: Four times a day (QID) | ORAL | Status: DC | PRN

## 2018-12-13 MED ORDER — TRAMADOL HCL 50 MG PO TABS
50.0000 mg | ORAL_TABLET | Freq: Three times a day (TID) | ORAL | Status: DC | PRN
  Administered 2018-12-13 – 2018-12-15 (×5): 50 mg via ORAL
  Filled 2018-12-13 (×6): qty 1

## 2018-12-13 MED ORDER — IBUPROFEN 600 MG PO TABS
600.0000 mg | ORAL_TABLET | Freq: Four times a day (QID) | ORAL | Status: DC | PRN
  Administered 2018-12-13 – 2018-12-14 (×3): 600 mg via ORAL
  Filled 2018-12-13 (×10): qty 1

## 2018-12-13 MED ORDER — LEVOTHYROXINE SODIUM 25 MCG PO TABS
25.0000 ug | ORAL_TABLET | Freq: Every day | ORAL | Status: DC
  Administered 2018-12-13 – 2018-12-15 (×3): 25 ug via ORAL
  Filled 2018-12-13 (×3): qty 1

## 2018-12-13 MED ORDER — ESTROGENS, CONJUGATED 0.625 MG/GM VA CREA
1.0000 | TOPICAL_CREAM | VAGINAL | Status: DC
  Administered 2018-12-14: 11:00:00 1 via VAGINAL
  Filled 2018-12-13 (×2): qty 30

## 2018-12-13 MED ORDER — SODIUM CHLORIDE 0.9 % IV SOLN
INTRAVENOUS | Status: DC
  Administered 2018-12-13 – 2018-12-15 (×6): via INTRAVENOUS

## 2018-12-13 MED ORDER — ATORVASTATIN CALCIUM 20 MG PO TABS
40.0000 mg | ORAL_TABLET | Freq: Every day | ORAL | Status: DC
  Administered 2018-12-13 – 2018-12-14 (×2): 40 mg via ORAL
  Filled 2018-12-13 (×2): qty 2

## 2018-12-13 NOTE — ED Notes (Signed)
Attempted to call report; RN unavailable at this time.   

## 2018-12-13 NOTE — H&P (Signed)
Elizabeth Shepard is an 78 y.o. female.   Chief Complaint: Weakness HPI: The patient with past medical history of hypertension and hypothyroidism presents to the emergency department complaining of weakness.  Urinalysis was consistent with infection.  She denies fever or dysuria, nausea or vomiting.  The patient was started on ceftriaxone in emergency department but continued to be too weak to ambulate without assistance which prompted the emergency department staff call hospitalist service for admission.  Past Medical History:  Diagnosis Date  . Depression   . Generalized pain   . Hypercholesteremia   . Hypertension   . Hypothyroidism   . Thyroid disease   . UTI (lower urinary tract infection)     Past Surgical History:  Procedure Laterality Date  . AUGMENTATION MAMMAPLASTY Bilateral 1980  . BREAST BIOPSY Right 1970   neg  . COLONOSCOPY WITH PROPOFOL N/A 09/12/2015   Procedure: COLONOSCOPY WITH PROPOFOL;  Surgeon: Lollie Sails, MD;  Location: [facility] ENDOSCOPY;  Service: Endoscopy;  Laterality: N/A;  . ESOPHAGOGASTRODUODENOSCOPY N/A 07/06/2015   Procedure: ESOPHAGOGASTRODUODENOSCOPY (EGD);  Surgeon: Lollie Sails, MD;  Location: [facility] ENDOSCOPY;  Service: Endoscopy;  Laterality: N/A;  . ESOPHAGOGASTRODUODENOSCOPY (EGD) WITH PROPOFOL N/A 09/12/2015   Procedure: ESOPHAGOGASTRODUODENOSCOPY (EGD) WITH PROPOFOL;  Surgeon: Lollie Sails, MD;  Location: [facility] ENDOSCOPY;  Service: Endoscopy;  Laterality: N/A;  . HIP SURGERY    . Mastoid surgery    . TONSILLECTOMY    . TOTAL HIP ARTHROPLASTY Bilateral     Family History  Problem Relation Age of Onset  . CAD Mother   . Diabetes Mother   . CAD Father   . Diabetes Father   . Bladder Cancer Neg Hx   . Prostate cancer Neg Hx   . Kidney cancer Neg Hx    Social History:  reports that she has never smoked. She has never used smokeless tobacco. She reports that she does not drink alcohol or use drugs.  Allergies:  Allergies  Allergen  Reactions  . Ciprofloxacin Itching and Rash    Medications Prior to Admission  Medication Sig Dispense Refill  . atorvastatin (LIPITOR) 40 MG tablet Take 40 mg by mouth daily.    Marland Kitchen conjugated estrogens (PREMARIN) vaginal cream Place 1 Applicatorful vaginally 2 (two) times a week.     . escitalopram (LEXAPRO) 10 MG tablet Take 10 mg by mouth daily.    Marland Kitchen HYDROcodone-acetaminophen (NORCO/VICODIN) 5-325 MG tablet Take 1 tablet by mouth every 4 (four) hours as needed for moderate pain. 24 tablet 0  . Ibuprofen (ADVIL) 200 MG CAPS Take 600 mg by mouth every 6 (six) hours.    Marland Kitchen levothyroxine (SYNTHROID, LEVOTHROID) 25 MCG tablet Take 25 mcg by mouth daily before breakfast.    . lisinopril (PRINIVIL,ZESTRIL) 10 MG tablet Take 10 mg by mouth daily.    . Multiple Vitamin (MULTIVITAMIN WITH MINERALS) TABS tablet Take 1 tablet by mouth daily.    . pantoprazole (PROTONIX) 40 MG tablet Take 40 mg by mouth daily.    . traMADol (ULTRAM) 50 MG tablet Take 50 mg by mouth every 8 (eight) hours as needed.      Results for orders placed or performed during the hospital encounter of 12/13/18 (from the past 48 hour(s))  Basic metabolic panel     Status: Abnormal   Collection Time: 12/13/18  2:13 AM  Result Value Ref Range   Sodium 136 135 - 145 mmol/L   Potassium 3.8 3.5 - 5.1 mmol/L   Chloride 103 98 -  111 mmol/L   CO2 22 22 - 32 mmol/L   Glucose, Bld 132 (H) 70 - 99 mg/dL   BUN 18 8 - 23 mg/dL   Creatinine, Ser 1.610.80 0.44 - 1.00 mg/dL   Calcium 8.6 (L) 8.9 - 10.3 mg/dL   GFR calc non Af Amer >60 >60 mL/min   GFR calc Af Amer >60 >60 mL/min   Anion gap 11 5 - 15    Comment: Performed at [facility], [address] [place], [place] [address]  CBC     Status: None   Collection Time: 12/13/18  2:13 AM  Result Value Ref Range   WBC 9.8 4.0 - 10.5 K/uL   RBC 4.09 3.87 - 5.11 MIL/uL   Hemoglobin 12.7 12.0 - 15.0 g/dL   HCT 54.038.8 98.136.0 - 19.146.0 %   MCV 94.9 80.0 - 100.0 fL   MCH 31.1 26.0 - 34.0  pg   MCHC 32.7 30.0 - 36.0 g/dL   RDW 47.812.4 29.511.5 - 62.115.5 %   Platelets 329 150 - 400 K/uL   nRBC 0.0 0.0 - 0.2 %    Comment: Performed at [facility], [address] [place], [place] [address]  Urinalysis, Complete w Microscopic     Status: Abnormal   Collection Time: 12/13/18  3:37 AM  Result Value Ref Range   Color, Urine YELLOW (A) YELLOW   APPearance CLOUDY (A) CLEAR   Specific Gravity, Urine 1.011 1.005 - 1.030   pH 6.0 5.0 - 8.0   Glucose, UA NEGATIVE NEGATIVE mg/dL   Hgb urine dipstick NEGATIVE NEGATIVE   Bilirubin Urine NEGATIVE NEGATIVE   Ketones, ur NEGATIVE NEGATIVE mg/dL   Protein, ur NEGATIVE NEGATIVE mg/dL   Nitrite NEGATIVE NEGATIVE   Leukocytes,Ua MODERATE (A) NEGATIVE   RBC / HPF 0-5 0 - 5 RBC/hpf   WBC, UA >50 (H) 0 - 5 WBC/hpf   Bacteria, UA MANY (A) NONE SEEN   Squamous Epithelial / LPF 0-5 0 - 5   Mucus PRESENT     Comment: Performed at [facility], [address] [place], [place] [address]  SARS Coronavirus 2 (CEPHEID - Performed in [facility] hospital lab), Hosp Order     Status: None   Collection Time: 12/13/18  4:32 AM   Specimen: Nasopharyngeal Swab  Result Value Ref Range   SARS Coronavirus 2 NEGATIVE NEGATIVE    Comment: (NOTE) If result is NEGATIVE SARS-CoV-2 target nucleic acids are NOT DETECTED. The SARS-CoV-2 RNA is generally detectable in upper and lower  respiratory specimens during the acute phase of infection. The lowest  concentration of SARS-CoV-2 viral copies this assay can detect is 250  copies / mL. A negative result does not preclude SARS-CoV-2 infection  and should not be used as the sole basis for treatment or other  patient management decisions.  A negative result may occur with  improper specimen collection / handling, submission of specimen other  than nasopharyngeal swab, presence of viral mutation(s) within the  areas targeted by this assay, and inadequate number of viral copies  (<250 copies / mL). A  negative result must be combined with clinical  observations, patient history, and epidemiological information. If result is POSITIVE SARS-CoV-2 target nucleic acids are DETECTED. The SARS-CoV-2 RNA is generally detectable in upper and lower  respiratory specimens dur ing the acute phase of infection.  Positive  results are indicative of active infection with SARS-CoV-2.  Clinical  correlation with patient history and other diagnostic information  is  necessary to determine patient infection status.  Positive results do  not rule out bacterial infection or co-infection with other viruses. If result is PRESUMPTIVE POSTIVE SARS-CoV-2 nucleic acids MAY BE PRESENT.   A presumptive positive result was obtained on the submitted specimen  and confirmed on repeat testing.  While 2019 novel coronavirus  (SARS-CoV-2) nucleic acids may be present in the submitted sample  additional confirmatory testing may be necessary for epidemiological  and / or clinical management purposes  to differentiate between  SARS-CoV-2 and other Sarbecovirus currently known to infect humans.  If clinically indicated additional testing with an alternate test  methodology [phone]([id number]) is advised. The SARS-CoV-2 RNA is generally  detectable in upper and lower respiratory sp ecimens during the acute  phase of infection. The expected result is Negative. Fact Sheet for Patients:  BoilerBrush.com.cyhttps://www.fda.gov/media/136312/download Fact Sheet for Healthcare Providers: https://pope.com/https://www.fda.gov/media/136313/download This test is not yet approved or cleared by the [place] FDA and has been authorized for detection and/or diagnosis of SARS-CoV-2 by FDA under an Emergency Use Authorization (EUA).  This EUA will remain in effect (meaning this test can be used) for the duration of the COVID-19 declaration under Section 564(b)(1) of the Act, 21 U.S.C. section 360bbb-3(b)(1), unless the authorization is terminated or revoked sooner. Performed  at [facility], [address] [place], [place] [address]    No results found.  Review of Systems  Constitutional: Negative for chills and fever.  HENT: Negative for sore throat and tinnitus.   Eyes: Negative for blurred vision and redness.  Respiratory: Negative for cough and shortness of breath.   Cardiovascular: Negative for chest pain, palpitations, orthopnea and PND.  Gastrointestinal: Negative for abdominal pain, diarrhea, nausea and vomiting.  Genitourinary: Negative for dysuria, frequency and urgency.  Musculoskeletal: Negative for joint pain and myalgias.  Skin: Negative for rash.       No lesions  Neurological: Positive for weakness. Negative for speech change and focal weakness.  Endo/Heme/Allergies: Does not bruise/bleed easily.       No temperature intolerance  Psychiatric/Behavioral: Negative for depression and suicidal ideas.    Blood pressure (!) 141/67, pulse 68, temperature 98.6 F (37 C), temperature source Oral, resp. rate 20, height 5\' 4"  (1.626 m), weight 76.6 kg, SpO2 94 %. Physical Exam  Vitals reviewed. Constitutional: She is oriented to person, place, and time. She appears well-developed and well-nourished. No distress.  HENT:  Head: Normocephalic and atraumatic.  Mouth/Throat: Oropharynx is clear and moist.  Eyes: Pupils are equal, round, and reactive to light. Conjunctivae and EOM are normal. No scleral icterus.  Neck: Normal range of motion. Neck supple. No JVD present. No tracheal deviation present. No thyromegaly present.  Cardiovascular: Normal rate, regular rhythm and normal heart sounds. Exam reveals no gallop and no friction rub.  No murmur heard. Respiratory: Effort normal and breath sounds normal.  GI: Soft. Bowel sounds are normal. She exhibits no distension. There is no abdominal tenderness.  Genitourinary:    Genitourinary Comments: Deferred   Musculoskeletal: Normal range of motion.        General: No edema.   Lymphadenopathy:    She has no cervical adenopathy.  Neurological: She is alert and oriented to person, place, and time. No cranial nerve deficit. She exhibits normal muscle tone.  Skin: Skin is warm and dry. No rash noted. No erythema.  Psychiatric: She has a normal mood and affect. Her behavior is normal. Judgment and thought content normal.     Assessment/Plan  This is a 78 year old female admitted for weakness. 1.  Weakness: Secondary to dehydration and debility.  The patient also has a UTI which contributes to both symptoms.  Continue to hydrate with normal saline.  PT and OT prior to discharge. 2.  UTI: Continue ceftriaxone.  Follow urine cultures 3.  Hypertension: Acceptable for age; continue lisinopril 4.  Hypothyroidism: Check TSH; continue Synthroid 5.  Depression: Stable; continue Lexapro 6.  Hyperlipidemia: Continue statin therapy 7.  DVT prophylaxis: Lovenox 8.  GI prophylaxis: Pantoprazole per home regimen 9 the patient is a full code.  Time spent on admission orders and patient care approximately 45 minutes  Arnaldo Nataliamond,  Cuthbert Turton S, MD 12/13/2018, 7:42 AM

## 2018-12-13 NOTE — ED Triage Notes (Signed)
Patient coming [facility] from home for generalized weakness X 2 days. Patient reports weakness is worse at night. Patient reports increased frequency of urination. Patient denies malodorous urine, dysuria, and discharge. Patient reports hx of frequent UTI.

## 2018-12-13 NOTE — ED Notes (Signed)
ED Provider at bedside. 

## 2018-12-13 NOTE — ED Provider Notes (Signed)
[facility] Emergency Department Provider Note  ____________________________________________  Time seen: Approximately 4:31 AM  I have reviewed the triage vital signs and the nursing notes.   HISTORY  Chief Complaint Weakness    Level 5 Caveat: Portions of the History and Physical including HPI and review of systems are unable to be completely obtained due to patient being a poor historian   HPI Elizabeth Shepard is a 78 y.o. female with a history of hyper lipidemia hypertension and anemia and recurrent urinary tract infections who comes the ED with generalized weakness for the past 2 to 3 days.  Difficulty ambulating or getting to the bathroom at home.  Denies fever chills chest pain shortness of breath.  She does note that she has been having to urinate about once every hour.   Denies falls.  She does complain of itching rash in the left armpit which is been there for a few weeks.  She has been taking Benadryl for the itching and putting topical steroids on the rash without relief     Past Medical History:  Diagnosis Date  . Depression   . Generalized pain   . Hypercholesteremia   . Hypertension   . Hypothyroidism   . Thyroid disease   . UTI (lower urinary tract infection)      Patient Active Problem List   Diagnosis Date Noted  . 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 Sepsis (HCC) 12/30/2015  . UTI (lower urinary tract infection) 12/29/2015  . Hypothyroidism 12/29/2015  . Weakness 12/29/2015  . Abnormal EKG 12/29/2015  . Allergic rhinitis 09/20/2015  . Clinical depression 09/20/2015  . HLD (hyperlipidemia) 09/20/2015  . BP (high blood pressure) 09/20/2015  . Anemia 07/04/2015  . Acute hemorrhagic cystitis 11/07/2014     Past Surgical History:  Procedure Laterality Date  . AUGMENTATION MAMMAPLASTY Bilateral 1980  . BREAST BIOPSY Right 1970   neg  . COLONOSCOPY WITH PROPOFOL N/A 09/12/2015   Procedure: COLONOSCOPY WITH  PROPOFOL;  Surgeon: Christena DeemMartin U Skulskie, MD;  [facility] END[facility];  Service: Endoscopy;  Laterality: N/A;  . ESOPHAGOGASTRODUODENOSCOPY N/A 07/06/2015   Procedure: ESOPHAGOGASTRODUODENOSCOPY (EGD);  Surgeon: Christena DeemMartin U Skulskie, MD;  [facility] END[facility]COPY;  Service: Endoscopy;  Laterality: N/A;  . ESOPHAGOGASTRODUODENOSCOPY (EGD) WITH PROPOFOL N/A 09/12/2015   Procedure: ESOPHAGOGASTRODUODENOSCOPY (EGD) WITH PROPOFOL;  Surgeon: Christena DeemMartin U Skulskie, MD;  [facility];  Service: Endoscopy;  Laterality: N/A;  . HIP SURGERY    . Mastoid surgery    . TONSILLECTOMY    . TOTAL HIP ARTHROPLASTY Bilateral      Prior to Admission medications   Medication Sig Start Date End Date Taking? Authorizing Provider  acetaminophen (TYLENOL) 500 MG tablet Take 1,000 mg by mouth every 6 (six) hours as needed.    [provider]  atorvastatin (LIPITOR) 40 MG tablet Take 40 mg by mouth daily.    [provider]  cetirizine (ZYRTEC) 10 MG tablet Take 10 mg by mouth daily as needed for allergies.    [provider]  conjugated estrogens (PREMARIN) vaginal cream Place 1 Applicatorful vaginally 2 (two) times a week.     [provider]  Cranberry 12600 MG CAPS Take 1 capsule by mouth daily.     [provider]  docusate sodium (COLACE) 100 MG capsule Take 1 capsule (100 mg total) by mouth 2 (two) times daily. 09/14/17   Alford HighlandWieting, Richard, MD  HYDROcodone-acetaminophen (NORCO/VICODIN) 5-325 MG tablet Take 1 tablet by mouth every  4 (four) hours as needed for moderate pain. 09/14/17   Alford HighlandWieting, Richard, MD  levothyroxine (SYNTHROID, LEVOTHROID) 25 MCG tablet Take 25 mcg by mouth daily before breakfast.    [provider]  lisinopril (PRINIVIL,ZESTRIL) 10 MG tablet Take 10 mg by mouth daily.    [provider]  Melatonin 5 MG TABS Take by mouth.    [provider]  Multiple Vitamin (MULTIVITAMIN WITH MINERALS) TABS tablet Take 1 tablet by mouth  daily.    [provider]  pantoprazole (PROTONIX) 40 MG tablet Take 1 tablet (40 mg total) by mouth daily. 09/14/17   Alford HighlandWieting, Richard, MD  senna (SENOKOT) 8.6 MG TABS tablet Take 1 tablet (8.6 mg total) by mouth daily. 09/14/17   Alford HighlandWieting, Richard, MD  sertraline (ZOLOFT) 100 MG tablet Take 100 mg by mouth at bedtime.    [provider]  sucralfate (CARAFATE) 1 g tablet Take 1 g by mouth 2 (two) times daily.    [provider]     Allergies Ciprofloxacin   Family History  Problem Relation Age of Onset  . CAD Mother   . Diabetes Mother   . CAD Father   . Diabetes Father   . Bladder Cancer Neg Hx   . Prostate cancer Neg Hx   . Kidney cancer Neg Hx     Social History Social History   Tobacco Use  . Smoking status: Never Smoker  . Smokeless tobacco: Never Used  Substance Use Topics  . Alcohol use: No  . Drug use: No    Review of Systems Level 5 Caveat: Portions of the History and Physical including HPI and review of systems are unable to be completely obtained due to patient being a poor historian   Constitutional:   No known fever.  ENT:   No rhinorrhea. Cardiovascular:   No chest pain or syncope. Respiratory:   No dyspnea or cough. Gastrointestinal:   Negative for abdominal pain, vomiting and diarrhea.  Musculoskeletal:   Negative for focal pain or swelling ____________________________________________   PHYSICAL EXAM:  VITAL SIGNS: ED Triage Vitals [12/13/18 0200]  Enc Vitals Group     BP 125/71     Pulse Rate 94     Resp 17     Temp 99.4 F (37.4 C)     Temp src      SpO2 92 %     Weight 171 lb 15.3 oz (78 kg)     Height      Head Circumference      Peak Flow      Pain Score 8     Pain Loc      Pain Edu?      Excl. in GC?     Vital signs reviewed, nursing assessments reviewed.   Constitutional:   Alert and oriented. Non-toxic appearance. Eyes:   Conjunctivae are normal. EOMI. PERRL. ENT      Head:   Normocephalic and  atraumatic.      Nose:   No congestion/rhinnorhea.       Mouth/Throat:   MMM, no pharyngeal erythema. No peritonsillar mass.       Neck:   No meningismus. Full ROM. Hematological/Lymphatic/Immunilogical:   No cervical lymphadenopathy. Cardiovascular:   RRR. Symmetric bilateral radial and DP pulses.  No murmurs. Cap refill less than 2 seconds. Respiratory:   Normal respiratory effort without tachypnea/retractions. Breath sounds are clear and equal bilaterally. No wheezes/rales/rhonchi. Gastrointestinal:   Soft with suprapubic tenderness. Non distended. There is no  CVA tenderness.  No rebound, rigidity, or guarding. Musculoskeletal:   Normal range of motion in all extremities. No joint effusions.  No lower extremity tenderness.  No edema. Neurologic:   Normal speech and language.  Motor grossly intact. No acute focal neurologic deficits are appreciated.  Skin:    Skin is warm, dry and intact.  Candidiasis rash in the left axilla  ____________________________________________    LABS (pertinent positives/negatives) (all labs ordered are listed, but only abnormal results are displayed) Labs Reviewed  BASIC METABOLIC PANEL - Abnormal; Notable for the following components:      Result Value   Glucose, Bld 132 (*)    Calcium 8.6 (*)    All other components within normal limits  URINALYSIS, COMPLETE (UACMP) WITH MICROSCOPIC - Abnormal; Notable for the following components:   Color, Urine YELLOW (*)    APPearance CLOUDY (*)    Leukocytes,Ua MODERATE (*)    WBC, UA >50 (*)    Bacteria, UA MANY (*)    All other components within normal limits  URINE CULTURE  SARS CORONAVIRUS 2 (HOSPITAL ORDER, [place] [facility])  CBC   ____________________________________________   EKG  Interpreted by me Sinus rhythm rate of 91, normal axis and intervals.  Normal QRS ST segments and T waves.  For PVCs on the strip.  ____________________________________________     RADIOLOGY  No results found.  ____________________________________________   PROCEDURES Procedures  ____________________________________________    CLINICAL IMPRESSION / ASSESSMENT AND PLAN / ED COURSE  Pertinent labs & imaging results that were available during my care of the patient were reviewed by me and considered in my medical decision making (see chart for details).   Elizabeth Shepard was evaluated in Emergency Department on 12/13/2018 for the symptoms described in the history of present illness. She was evaluated in the context of the global COVID-19 pandemic, which necessitated consideration that the patient might be at risk for infection with the SARS-CoV-2 virus that causes COVID-19. Institutional protocols and algorithms that pertain to the evaluation of patients at risk for COVID-19 are in a state of rapid change based on information released by regulatory bodies including the CDC and federal and state organizations. These policies and algorithms were followed during the patient's care in the ED.     Clinical Course as of Dec 12 429  Sun Dec 13, 2018  0410 Patient presents with generalized weakness which appears to be in part due to UTI as seen on urinalysis.  I suspect also that this is worsened by her Benadryl use for the itching rash under her left axilla which is due to candidiasis.  I have advised her to stop Benadryl and topical cortisone cream, start nystatin cream as well as antibiotic for the UTI which will be guided by prior culture data.  Urinalysis, Complete w Microscopic(!) [PS]    Clinical Course User Index [PS] Carrie Mew, MD     ----------------------------------------- 4:43 AM on 12/13/2018 -----------------------------------------  Patient required 2 person assist to use the bathroom and even still was unable to participate much in her own self-care.  With the degree of weakness and acute decline in her functional status with her urinary  tract infection, have discussed her care with the hospitalist for further management.  Ceftriaxone IV ordered, urine culture, nystatin cream.  IV fluids for hydration.  ____________________________________________   FINAL CLINICAL IMPRESSION(S) / ED DIAGNOSES    Final diagnoses:  Cystitis  Generalized weakness  Candidiasis of skin  ED Discharge Orders    None      Portions of this note were generated with dragon dictation software. Dictation errors may occur despite best attempts at proofreading.   Sharman CheekStafford, Dmarion Perfect, MD 12/13/18 [phone]

## 2018-12-13 NOTE — ED Notes (Signed)
Attempted to call report RN no available at this time.

## 2018-12-13 NOTE — Progress Notes (Signed)
Patient discussed with Dr. Marcille Blanco who admitted patient earlier this morning.  Patient here with generalized weakness positive UTI.  Continue current antibiotics and follow-up on urine culture.

## 2018-12-13 NOTE — Care Management Obs Status (Signed)
[place] NOTIFICATION   Patient Details  Name: Elizabeth Shepard MRN: [id number] Date of Birth: [birth year]   Medicare Observation Status Notification Given:  Yes    Pepper Kerrick A Azalia Neuberger, RN 12/13/2018, 8:43 AM

## 2018-12-13 NOTE — ED Notes (Signed)
Patient assisted to the bathroom with 2 person assist.

## 2018-12-13 NOTE — ED Notes (Signed)
ED TO INPATIENT HANDOFF REPORT  ED Nurse Name and Phone #: Kyira Volkert [phone]  S Name/Age/Gender Elizabeth Shepard 78 y.o. female Room/Bed: ED04A/ED04A  Code Status   Code Status: Prior  Home/SNF/Other Home Patient oriented to: self, place, time and situation Is this baseline? Yes   Triage Complete: Triage complete  Chief Complaint [facility] Weakness  Triage Note Patient coming [facility] from home for generalized weakness X 2 days. Patient reports weakness is worse at night. Patient reports increased frequency of urination. Patient denies malodorous urine, dysuria, and discharge. Patient reports hx of frequent UTI.    Allergies Allergies  Allergen Reactions  . Ciprofloxacin Itching and Rash    Level of Care/Admitting Diagnosis ED Disposition    ED Disposition Condition Comment   Admit  [facility] [[id number]]  Level of Care: Med-Surg [16]  Covid Evaluation: Confirmed COVID Negative  Diagnosis: Weakness [241835]  Admitting Physician: Arnaldo NatalDIAMOND, MICHAEL S [[id number]]  Attending Physician: Arnaldo NatalIAMOND, MICHAEL S [phone][[id number]]  PT Class (Do Not Modify): Observation [104]  PT Acc Code (Do Not Modify): Observation [10022]       B Medical/Surgery History Past Medical History:  Diagnosis Date  . Depression   . Generalized pain   . Hypercholesteremia   . Hypertension   . Hypothyroidism   . Thyroid disease   . UTI (lower urinary tract infection)    Past Surgical History:  Procedure Laterality Date  . AUGMENTATION MAMMAPLASTY Bilateral 1980  . BREAST BIOPSY Right 1970   neg  . COLONOSCOPY WITH PROPOFOL N/A 09/12/2015   Procedure: COLONOSCOPY WITH PROPOFOL;  Surgeon: Christena DeemMartin U Skulskie, MD;  [facility];  Service: Endoscopy;  Laterality: N/A;  . ESOPHAGOGASTRODUODENOSCOPY N/A 07/06/2015   Procedure: ESOPHAGOGASTRODUODENOSCOPY (EGD);  Surgeon: Christena DeemMartin U Skulskie, MD;  [facility];  Service: Endoscopy;  Laterality: N/A;  .  ESOPHAGOGASTRODUODENOSCOPY (EGD) WITH PROPOFOL N/A 09/12/2015   Procedure: ESOPHAGOGASTRODUODENOSCOPY (EGD) WITH PROPOFOL;  Surgeon: Christena DeemMartin U Skulskie, MD;  [facility];  Service: Endoscopy;  Laterality: N/A;  . HIP SURGERY    . Mastoid surgery    . TONSILLECTOMY    . TOTAL HIP ARTHROPLASTY Bilateral      A IV Location/Drains/Wounds Patient Lines/Drains/Airways Status   Active Line/Drains/Airways    Name:   Placement date:   Placement time:   Site:   Days:   Peripheral IV 12/13/18 Left Hand   12/13/18    0420    Hand   less than 1   Wound / Incision (Open or Dehisced) 09/11/17 Laceration Face Left   09/11/17    2316    Face   458          Intake/Output Last 24 hours No intake or output data in the 24 hours ending 12/13/18 0545  Labs/Imaging Results for orders placed or performed during the hospital encounter of 12/13/18 (from the past 48 hour(s))  Basic metabolic panel     Status: Abnormal   Collection Time: 12/13/18  2:13 AM  Result Value Ref Range   Sodium 136 135 - 145 mmol/L   Potassium 3.8 3.5 - 5.1 mmol/L   Chloride 103 98 - 111 mmol/L   CO2 22 22 - 32 mmol/L   Glucose, Bld 132 (H) 70 - 99 mg/dL   BUN 18 8 - 23 mg/dL   Creatinine, Ser 1.910.80 0.44 - 1.00 mg/dL   Calcium 8.6 (L) 8.9 - 10.3 mg/dL   GFR calc non Af Amer >60 >60 mL/min  GFR calc Af Amer >60 >60 mL/min   Anion gap 11 5 - 15    Comment: Performed at [facility], [address] [place], [place] [address]  CBC     Status: None   Collection Time: 12/13/18  2:13 AM  Result Value Ref Range   WBC 9.8 4.0 - 10.5 K/uL   RBC 4.09 3.87 - 5.11 MIL/uL   Hemoglobin 12.7 12.0 - 15.0 g/dL   HCT 19.138.8 47.836.0 - 29.546.0 %   MCV 94.9 80.0 - 100.0 fL   MCH 31.1 26.0 - 34.0 pg   MCHC 32.7 30.0 - 36.0 g/dL   RDW 62.112.4 30.811.5 - 65.715.5 %   Platelets 329 150 - 400 K/uL   nRBC 0.0 0.0 - 0.2 %    Comment: Performed at [facility], [address] [place], [place] [address]  Urinalysis, Complete w  Microscopic     Status: Abnormal   Collection Time: 12/13/18  3:37 AM  Result Value Ref Range   Color, Urine YELLOW (A) YELLOW   APPearance CLOUDY (A) CLEAR   Specific Gravity, Urine 1.011 1.005 - 1.030   pH 6.0 5.0 - 8.0   Glucose, UA NEGATIVE NEGATIVE mg/dL   Hgb urine dipstick NEGATIVE NEGATIVE   Bilirubin Urine NEGATIVE NEGATIVE   Ketones, ur NEGATIVE NEGATIVE mg/dL   Protein, ur NEGATIVE NEGATIVE mg/dL   Nitrite NEGATIVE NEGATIVE   Leukocytes,Ua MODERATE (A) NEGATIVE   RBC / HPF 0-5 0 - 5 RBC/hpf   WBC, UA >50 (H) 0 - 5 WBC/hpf   Bacteria, UA MANY (A) NONE SEEN   Squamous Epithelial / LPF 0-5 0 - 5   Mucus PRESENT     Comment: Performed at [facility], [address] [place], [place] [address]   No results found.  Pending Labs [employer] [facility] (From admission, onward)    Start     Ordered   12/13/18 0432  SARS Coronavirus 2 (CEPHEID - Performed in [facility] lab), Hosp Order  (Asymptomatic Patients Labs)  ONCE - STAT,   STAT    Question:  Rule Out  Answer:  Yes   12/13/18 0431   12/13/18 0413  Urine Culture  Add-on,   AD     12/13/18 0412   Signed and Held  Creatinine, serum  (enoxaparin (LOVENOX)    CrCl >/= 30 ml/min)  Weekly,   R    Comments: while on enoxaparin therapy    Signed and Held   Signed and Held  TSH  Add-on,   R     Signed and Held          Vitals/Pain Today's Vitals   12/13/18 0300 12/13/18 0500 12/13/18 0509 12/13/18 0530  BP: 124/64 (!) 145/70  (!) 130/58  Pulse: 80 66  66  Resp: 15 (!) 22  18  Temp:   98.4 F (36.9 C)   SpO2: 93% 96%  95%  Weight:      PainSc:        Isolation Precautions No active isolations  Medications Medications  nystatin cream (MYCOSTATIN) (has no administration in time range)  cefTRIAXone (ROCEPHIN) 1 g in sodium chloride 0.9 % 100 mL IVPB (1 g Intravenous New Bag/Given 12/13/18 0436)  sodium chloride 0.9 % bolus 1,000 mL (1,000 mLs Intravenous New Bag/Given 12/13/18 0446)  acetaminophen  (TYLENOL) tablet 650 mg (650 mg Oral Given 12/13/18 0446)    Mobility walks with person assist Moderate fall risk   Focused Assessments  Cardiac Assessment Handoff:    Lab Results  Component Value Date   TROPONINI 0.03 ([place]) 12/29/2015   No results found for: DDIMER Does the Patient currently have chest pain? No     R Recommendations: See Admitting Provider Note  Report given to:   Additional Notes:

## 2018-12-14 DIAGNOSIS — I10 Essential (primary) hypertension: Secondary | ICD-10-CM | POA: Diagnosis present

## 2018-12-14 DIAGNOSIS — Z79890 Hormone replacement therapy (postmenopausal): Secondary | ICD-10-CM | POA: Diagnosis not present

## 2018-12-14 DIAGNOSIS — E860 Dehydration: Secondary | ICD-10-CM | POA: Diagnosis present

## 2018-12-14 DIAGNOSIS — M545 Low back pain: Secondary | ICD-10-CM | POA: Diagnosis present

## 2018-12-14 DIAGNOSIS — R5381 Other malaise: Secondary | ICD-10-CM | POA: Diagnosis present

## 2018-12-14 DIAGNOSIS — Z87440 Personal history of urinary (tract) infections: Secondary | ICD-10-CM | POA: Diagnosis not present

## 2018-12-14 DIAGNOSIS — N390 Urinary tract infection, site not specified: Secondary | ICD-10-CM | POA: Diagnosis present

## 2018-12-14 DIAGNOSIS — Z1159 Encounter for screening for other viral diseases: Secondary | ICD-10-CM | POA: Diagnosis not present

## 2018-12-14 DIAGNOSIS — G8929 Other chronic pain: Secondary | ICD-10-CM | POA: Diagnosis present

## 2018-12-14 DIAGNOSIS — E039 Hypothyroidism, unspecified: Secondary | ICD-10-CM | POA: Diagnosis present

## 2018-12-14 DIAGNOSIS — E785 Hyperlipidemia, unspecified: Secondary | ICD-10-CM | POA: Diagnosis present

## 2018-12-14 DIAGNOSIS — F329 Major depressive disorder, single episode, unspecified: Secondary | ICD-10-CM | POA: Diagnosis present

## 2018-12-14 DIAGNOSIS — Z8249 Family history of ischemic heart disease and other diseases of the circulatory system: Secondary | ICD-10-CM | POA: Diagnosis not present

## 2018-12-14 DIAGNOSIS — R262 Difficulty in walking, not elsewhere classified: Secondary | ICD-10-CM | POA: Diagnosis present

## 2018-12-14 DIAGNOSIS — Z833 Family history of diabetes mellitus: Secondary | ICD-10-CM | POA: Diagnosis not present

## 2018-12-14 DIAGNOSIS — B372 Candidiasis of skin and nail: Secondary | ICD-10-CM | POA: Diagnosis present

## 2018-12-14 LAB — URINE CULTURE: Culture: >=100000 — AB

## 2018-12-14 MED ORDER — GABAPENTIN 100 MG PO CAPS
100.0000 mg | ORAL_CAPSULE | Freq: Two times a day (BID) | ORAL | Status: DC
  Administered 2018-12-14 – 2018-12-15 (×3): 100 mg via ORAL
  Filled 2018-12-14 (×3): qty 1

## 2018-12-14 MED ORDER — IBUPROFEN 600 MG PO TABS
600.0000 mg | ORAL_TABLET | Freq: Every day | ORAL | Status: DC | PRN
  Administered 2018-12-14 – 2018-12-15 (×2): 600 mg via ORAL
  Filled 2018-12-14 (×3): qty 1

## 2018-12-14 MED ORDER — SODIUM CHLORIDE 0.9 % IV SOLN
1.0000 g | INTRAVENOUS | Status: DC
  Administered 2018-12-14: 06:00:00 1 g via INTRAVENOUS
  Filled 2018-12-14: qty 1
  Filled 2018-12-14: qty 10

## 2018-12-14 NOTE — Progress Notes (Signed)
[place] at [place] NAME: Elizabeth Shepard    MR#:  [id number]  DATE OF BIRTH:  1941/03/21  SUBJECTIVE:  CHIEF COMPLAINT:   Chief Complaint  Patient presents with  . Weakness   Needs to feel extremely weak.  REVIEW OF SYSTEMS:    Review of Systems  Constitutional: Positive for malaise/fatigue. Negative for chills and fever.  HENT: Negative for sore throat.   Eyes: Negative for blurred vision, double vision and pain.  Respiratory: Negative for cough, hemoptysis, shortness of breath and wheezing.   Cardiovascular: Negative for chest pain, palpitations, orthopnea and leg swelling.  Gastrointestinal: Negative for abdominal pain, constipation, diarrhea, heartburn, nausea and vomiting.  Genitourinary: Negative for dysuria and hematuria.  Musculoskeletal: Positive for back pain. Negative for joint pain.  Skin: Negative for rash.  Neurological: Negative for sensory change, speech change, focal weakness and headaches.  Endo/Heme/Allergies: Does not bruise/bleed easily.  Psychiatric/Behavioral: Negative for depression. The patient is not nervous/anxious.     DRUG ALLERGIES:   Allergies  Allergen Reactions  . Ciprofloxacin Itching and Rash    VITALS:  Blood pressure 132/64, pulse 64, temperature 98.5 F (36.9 C), temperature source Oral, resp. rate 17, height 5\' 4"  (1.626 m), weight 77.1 kg, SpO2 93 %.  PHYSICAL EXAMINATION:   Physical Exam  GENERAL:  78 y.o.-year-old patient lying in the bed with no acute distress.  EYES: Pupils equal, round, reactive to light and accommodation. No scleral icterus. Extraocular muscles intact.  HEENT: Head atraumatic, normocephalic. Oropharynx and nasopharynx clear.  NECK:  Supple, no jugular venous distention. No thyroid enlargement, no tenderness.  LUNGS: Normal breath sounds bilaterally, no wheezing, rales, rhonchi. No use of accessory muscles of respiration.  CARDIOVASCULAR: S1, S2 normal. No murmurs,  rubs, or gallops.  ABDOMEN: Soft, nontender, nondistended. Bowel sounds present. No organomegaly or mass.  EXTREMITIES: No cyanosis, clubbing or edema b/l.    NEUROLOGIC: Cranial nerves II through XII are intact. No focal Motor or sensory deficits b/l.   PSYCHIATRIC: The patient is alert and oriented x 3.  SKIN: No obvious rash, lesion, or ulcer.   LABORATORY PANEL:   CBC Recent Labs  Lab 12/13/18 0213  WBC 9.8  HGB 12.7  HCT 38.8  PLT 329   ------------------------------------------------------------------------------------------------------------------ Chemistries  Recent Labs  Lab 12/13/18 0213  NA 136  K 3.8  CL 103  CO2 22  GLUCOSE 132*  BUN 18  CREATININE 0.80  CALCIUM 8.6*   ------------------------------------------------------------------------------------------------------------------  Cardiac Enzymes No results for input(s): TROPONINI in the last 168 hours. ------------------------------------------------------------------------------------------------------------------  RADIOLOGY:  No results found.   ASSESSMENT AND PLAN:   This is a 78 year old female admitted for weakness.  *UTI.  Cultures pending.  Patient continues to feel very weak. Afebrile and normal WBC today.  Continues to feel extremely weak and will wait for cultures.  Likely discharge tomorrow.  Physical therapy evaluation.  *Generalized weakness secondary to UTI.  PT eval  *  Hypertension:  continue lisinopril  *  Hypothyroidism:  Normal TSH.  Continue Synthroid  *  Depression: Stable; continue Lexapro  *  Hyperlipidemia: Continue statin therapy  *Chronic low back pain shooting down right leg She uses Advil 3 times a day at home.  Will add Neurontin and advised to decrease Advil to PRN.  *  DVT prophylaxis: Lovenox  All the records are reviewed and case discussed with Care Management/Social Worker Management plans discussed with the patient, family and they are in  agreement.  CODE STATUS: Full code  DVT Prophylaxis: SCDs  TOTAL TIME TAKING CARE OF THIS PATIENT: 35 minutes.   POSSIBLE D/C IN 1-2 DAYS, DEPENDING ON CLINICAL CONDITION.  Molinda BailiffSrikar R Dameian Crisman M.D on 12/14/2018 at 11:18 AM  Between 7am to 6pm - Pager - [phone]  After 6pm go to [email or web address] - password [email or web address]  [facility]  Office  [phone]  CC: Primary care physician; Medicine, [facility]  Note: This dictation was prepared with Dragon dictation along with smaller phrase technology. Any transcriptional errors that result from this process are unintentional.

## 2018-12-14 NOTE — Plan of Care (Signed)

## 2018-12-14 NOTE — Evaluation (Signed)
Physical Therapy Evaluation Patient Details Name: Elizabeth Shepard MRN: [id number] DOB: [birth year] Today's Date: 12/14/2018   History of Present Illness  Patient admitted with weakness, UTI. PMH to include: HTN, Hypothyroidism, Deporession, Hypercholesterol,  Clinical Impression  Patient received in bed, pleasant, agreeable to walking.  Patient performs bed mobility with modified independence.  Requests to use BSC prior to ambulation. Assisted patient with toileting, then ambulated 175 feet with RW and min guard assist. Patient requires increased time with mobility and increased effort. Occasional unsteadiness noted when standing without support. Patient will benefit from continued skilled PT to improve her strength, and functional independence for return home with husband.      Follow Up Recommendations Home health PT    Equipment Recommendations  None recommended by PT    Recommendations for Other Services       Precautions / Restrictions Precautions Precautions: Fall Precaution Comments: mod fall Restrictions Weight Bearing Restrictions: No      Mobility  Bed Mobility Overal bed mobility: Modified Independent             General bed mobility comments: increased time and effort, use of bed rails.  Transfers Overall transfer level: Modified independent Equipment used: Rolling walker (2 wheeled)             General transfer comment: min gaurd for transfers  Ambulation/Gait Ambulation/Gait assistance: Min guard Gait Distance (Feet): 175 Feet Assistive device: Rolling walker (2 wheeled) Gait Pattern/deviations: Step-to pattern;Decreased step length - right;Decreased step length - left;Trunk flexed Gait velocity: decreased      Stairs            Wheelchair Mobility    Modified Rankin (Stroke Patients Only)       Balance Overall balance assessment: Needs assistance Sitting-balance support: Feet supported Sitting balance-Leahy Scale: Good      Standing balance support: Bilateral upper extremity supported Standing balance-Leahy Scale: Fair                               Pertinent Vitals/Pain Pain Assessment: No/denies pain    Home Living Family/patient expects to be discharged to:: Private residence Living Arrangements: Spouse/significant other Available Help at Discharge: Family Type of Home: House Home Access: Level entry     Home Layout: One level Home Equipment: Environmental consultant - 4 wheels      Prior Function Level of Independence: Independent with assistive device(s)         Comments: Patient reports she uses rollator, does her own bathing and dressing.     Hand Dominance        Extremity/Trunk Assessment   Upper Extremity Assessment Upper Extremity Assessment: Generalized weakness    Lower Extremity Assessment Lower Extremity Assessment: Generalized weakness    Cervical / Trunk Assessment Cervical / Trunk Assessment: Normal  Communication   Communication: No difficulties  Cognition Arousal/Alertness: Awake/alert Behavior During Therapy: WFL for tasks assessed/performed Overall Cognitive Status: Within Functional Limits for tasks assessed                                        General Comments      Exercises     Assessment/Plan    PT Assessment Patient needs continued PT services  PT Problem List Decreased strength;Decreased mobility;Decreased activity tolerance;Decreased balance       PT Treatment Interventions Therapeutic activities;Gait training;Therapeutic  exercise;Patient/family education;Functional mobility training;Balance training    PT Goals (Current goals can be found in the Care Plan section)  Acute Rehab PT Goals Patient Stated Goal: to return home once stronger PT Goal Formulation: With patient Time For Goal Achievement: 12/21/18 Potential to Achieve Goals: Good    Frequency Min 2X/week   Barriers to discharge        Co-evaluation                AM-PAC PT "6 Clicks" Mobility  Outcome Measure Help needed turning from your back to your side while in a flat bed without using bedrails?: A Little Help needed moving from lying on your back to sitting on the side of a flat bed without using bedrails?: A Little Help needed moving to and from a bed to a chair (including a wheelchair)?: A Little Help needed standing up from a chair using your arms (e.g., wheelchair or bedside chair)?: A Little Help needed to walk in hospital room?: A Little Help needed climbing 3-5 steps with a railing? : A Little 6 Click Score: 18    End of Session Equipment Utilized During Treatment: Gait belt Activity Tolerance: Patient tolerated treatment well;Patient limited by lethargy Patient left: in bed;with bed alarm set Nurse Communication: Mobility status PT Visit Diagnosis: Unsteadiness on feet (R26.81);Muscle weakness (generalized) (M62.81);Difficulty in walking, not elsewhere classified (R26.2)    Time: 1450-1510 PT Time Calculation (min) (ACUTE ONLY): 20 min   Charges:   PT Evaluation $PT Eval Low Complexity: 1 Low PT Treatments $Gait Training: 8-22 mins        Leanthony Rhett, PT, GCS 12/14/18,4:10 PM

## 2018-12-15 MED ORDER — IBUPROFEN 200 MG PO CAPS: 600.0000 mg | ORAL_CAPSULE | Freq: Every day | ORAL | 0 refills | Status: DC | PRN

## 2018-12-15 MED ORDER — CEPHALEXIN 500 MG PO CAPS: 500.0000 mg | ORAL_CAPSULE | Freq: Three times a day (TID) | ORAL | 0 refills | Status: AC

## 2018-12-15 MED ORDER — TRAMADOL HCL 50 MG PO TABS: 50.0000 mg | ORAL_TABLET | Freq: Three times a day (TID) | ORAL | 0 refills | Status: DC | PRN

## 2018-12-15 MED ORDER — TRAMADOL HCL 50 MG PO TABS: 50.0000 mg | ORAL_TABLET | Freq: Three times a day (TID) | ORAL | 0 refills | Status: AC | PRN

## 2018-12-15 MED ORDER — GABAPENTIN 100 MG PO CAPS: 100.0000 mg | ORAL_CAPSULE | Freq: Two times a day (BID) | ORAL | 0 refills | Status: AC

## 2018-12-15 MED ORDER — NYSTATIN-TRIAMCINOLONE 100000-0.1 UNIT/GM-% EX OINT: 1.0000 "application " | TOPICAL_OINTMENT | Freq: Two times a day (BID) | CUTANEOUS | 0 refills | Status: DC

## 2018-12-15 NOTE — Progress Notes (Signed)
Patient discharged via EMS

## 2018-12-15 NOTE — Progress Notes (Signed)
Physical Therapy Treatment Patient Details Name: Elizabeth Shepard MRN: [id number] DOB: Aug 13, 1940 Today's Date: 12/15/2018    History of Present Illness Patient admitted with weakness, UTI. PMH to include: HTN, Hypothyroidism, Deporession, Hypercholesterol,    PT Comments    Pt is making gradual progress towards goals. ENcouraged to continue mobility efforts this date to prevent muscle atrophy.  Pt appears limited by R LE nerve pain, reports she had previously received steroid injection in her back that has helped but that was 9 months ago. Nursing notified of pt's concerns and wishes for this to be addressed during this hospitalization. Educated on pain relief strategies including sleeping positions. Pt reports she feels stronger today. Will continue to progress as able. Pt wishes to ambulate again this date, encouraged to notify RN staff for assistance.   Follow Up Recommendations  Home health PT     Equipment Recommendations  None recommended by PT    Recommendations for Other Services       Precautions / Restrictions Precautions Precautions: Fall Precaution Comments: mod fall Restrictions Weight Bearing Restrictions: No    Mobility  Bed Mobility Overal bed mobility: Modified Independent             General bed mobility comments: safe technique  Transfers Overall transfer level: Modified independent Equipment used: Rolling walker (2 wheeled)             General transfer comment: Safe technique with push off from bed. RW used  Ambulation/Gait Ambulation/Gait assistance: Counsellor (Feet): 150 Feet Assistive device: Rolling walker (2 wheeled) Gait Pattern/deviations: Step-to pattern;Decreased step length - right;Decreased step length - left;Trunk flexed     General Gait Details: ambulated with decreased stance time on R LE due to pain. Reports no increase in symptoms during ambulation, however definitely limited in distance due to pain   Stairs             Wheelchair Mobility    Modified Rankin (Stroke Patients Only)       Balance Overall balance assessment: Needs assistance Sitting-balance support: Feet supported Sitting balance-Leahy Scale: Good     Standing balance support: Bilateral upper extremity supported Standing balance-Leahy Scale: Fair Standing balance comment: leans towards L away from pain                            Cognition Arousal/Alertness: Awake/alert Behavior During Therapy: WFL for tasks assessed/performed Overall Cognitive Status: Within Functional Limits for tasks assessed                                        Exercises Other Exercises Other Exercises: Ambulated additional 20' to bathroom, used RW and supervision. Able to perform hygiene safely with supervision including standing at sink for washing hands. Other Exercises: Supine ther-ex performed including B LE AP, hip add squeezes, and heel slides. 10 reps with supervision.    General Comments        Pertinent Vitals/Pain Pain Assessment: 0-10 Pain Score: 9  Pain Location: R leg, nerve pain Pain Descriptors / Indicators: Discomfort;Cramping;Burning Pain Intervention(s): Limited activity within patient's tolerance;Premedicated before session    Home Living                      Prior Function            PT Goals (current  goals can now be found in the care plan section) Acute Rehab PT Goals Patient Stated Goal: to return home once stronger PT Goal Formulation: With patient Time For Goal Achievement: 12/21/18 Potential to Achieve Goals: Good Progress towards PT goals: Progressing toward goals    Frequency    Min 2X/week      PT Plan Current plan remains appropriate    Co-evaluation              AM-PAC PT "6 Clicks" Mobility   Outcome Measure  Help needed turning from your back to your side while in a flat bed without using bedrails?: A Little Help needed moving from  lying on your back to sitting on the side of a flat bed without using bedrails?: A Little Help needed moving to and from a bed to a chair (including a wheelchair)?: A Little Help needed standing up from a chair using your arms (e.g., wheelchair or bedside chair)?: A Little Help needed to walk in hospital room?: A Little Help needed climbing 3-5 steps with a railing? : A Little 6 Click Score: 18    End of Session Equipment Utilized During Treatment: Gait belt Activity Tolerance: Patient tolerated treatment well;Patient limited by lethargy Patient left: in bed;with bed alarm set Nurse Communication: Mobility status PT Visit Diagnosis: Unsteadiness on feet (R26.81);Muscle weakness (generalized) (M62.81);Difficulty in walking, not elsewhere classified (R26.2)     Time: [id number] PT Time Calculation (min) (ACUTE ONLY): 26 min  Charges:  $Gait Training: 8-22 mins $Therapeutic Exercise: 8-22 mins                     Elizabeth PalauStephanie Teira Arcilla, PT, DPT [phone]    Finis Hendricksen 12/15/2018, 11:46 AM

## 2018-12-15 NOTE — Progress Notes (Signed)
EMS transport called for patient, per EMS patient is 2nd in line. Discharge instructions provided to patient, patient does not report any questions. Elizabeth Shepard

## 2018-12-15 NOTE — TOC Transition Note (Signed)
Transition of Care [facility]) - CM/SW Discharge Note   Patient Details  Name: Elizabeth Shepard MRN: [id number] Date of Birth: 1941/01/18  [facility] CM/SW Contact:  [place], Lenice Llamas Phone Number: [phone]  12/15/2018, 3:02 PM   Clinical Narrative:   PT is recommending home health. Patient requested [place] however [facility] could not [facility] patient. [facility] did accept patient. Patient is agreeable to [facility] home health and requested a female PT. Bradd Burner home health intake specialist is aware of above and aware of D/C today. Patient requested EMS for transport home today. Per patient her husband can't drive because of the medication he is on and her son lives out of state. Per patient she has no friends or family that can take her home. Patient reported that she needs assistance getting in her house. Per patient she normally drives herself however she can't do that today. CSW explained that EMS may not be covered by insurance. Patient verbalized her understanding. EMS form complete. RN aware of above. Please reconsult if future social work needs arise. CSW signing off.      Final next level of care: Home w Home Health Services Barriers to Discharge: Barriers Resolved   Patient Goals and CMS Choice Patient states their goals for this hospitalization and ongoing recovery are:: Improve mobility   Choice offered to / list presented to : Patient  Discharge Placement                       Discharge Plan and Services                          HH Arranged: PT [facility] Agency: [place] Date [place]: 12/15/18 Time [place]: 1502 Representative spoke with at [place]: [place] ([place]) Interventions     Readmission Risk Interventions No flowsheet data found.

## 2018-12-15 NOTE — Discharge Instructions (Signed)
Resume diet and activity as before ° ° °

## 2018-12-18 NOTE — Discharge Summary (Signed)
[facility] - [place] at [facility]   PATIENT NAME: Elizabeth Shepard    MR#:  [id number]  DATE OF BIRTH:  1940-09-16  DATE OF ADMISSION:  12/13/2018 ADMITTING PHYSICIAN: Arnaldo NatalMichael S Diamond, MD  DATE OF DISCHARGE: 12/15/2018  4:51 PM  PRIMARY CARE PHYSICIAN: Marisue IvanLinthavong, Kanhka, MD   ADMISSION DIAGNOSIS:  Candidiasis of skin [B37.2] Generalized weakness [R53.1] Cystitis [N30.90]  DISCHARGE DIAGNOSIS:  Active Problems:   Weakness   SECONDARY DIAGNOSIS:   Past Medical History:  Diagnosis Date  . Depression   . Generalized pain   . Hypercholesteremia   . Hypertension   . Hypothyroidism   . Thyroid disease   . UTI (lower urinary tract infection)      ADMITTING HISTORY  Chief Complaint: Weakness HPI: The patient with past medical history of hypertension and hypothyroidism presents to the emergency department complaining of weakness.  Urinalysis was consistent with infection.  She denies fever or dysuria, nausea or vomiting.  The patient was started on ceftriaxone in emergency department but continued to be too weak to ambulate without assistance which prompted the emergency department staff call hospitalist service for admission.  HOSPITAL COURSE:   This is a 78 year old female admitted for weakness.  *UTI.    Cultures negative Patient was treated with IV ceftriaxone in the hospital and discharged home on Keflex. Afebrile and normal WBC with area of discharge.  *Generalized weakness secondary to UTI.  PT eval. home health recommended.  Weakness improved by day of discharge.  * Hypertension:  continue lisinopril  * Hypothyroidism:  Normal TSH.  Continue Synthroid  * Depression: Stable; continue Lexapro  * Hyperlipidemia: Continue statin therapy  *Chronic low back pain shooting down right leg She uses Advil 3 times a day at home.    Added Neurontin and advised to decrease Advil to PRN. Patient will follow-up with her orthopedic surgeon and is  scheduled for outpatient MRI. Prescription given for short course of tramadol.  Patient is stable to discharge home.  CONSULTS OBTAINED:    DRUG ALLERGIES:   Allergies  Allergen Reactions  . Ciprofloxacin Itching and Rash    DISCHARGE MEDICATIONS:   Allergies as of 12/15/2018      Reactions   Ciprofloxacin Itching, Rash      Medication List    TAKE these medications   atorvastatin 40 MG tablet Commonly known as: LIPITOR Take 40 mg by mouth daily.   cephALEXin 500 MG capsule Commonly known as: KEFLEX Take 1 capsule (500 mg total) by mouth 3 (three) times daily for 3 days.   conjugated estrogens vaginal cream Commonly known as: PREMARIN Place 1 Applicatorful vaginally 2 (two) times a week.   escitalopram 10 MG tablet Commonly known as: LEXAPRO Take 10 mg by mouth daily.   gabapentin 100 MG capsule Commonly known as: NEURONTIN Take 1 capsule (100 mg total) by mouth 2 (two) times daily.   HYDROcodone-acetaminophen 5-325 MG tablet Commonly known as: NORCO/VICODIN Take 1 tablet by mouth every 4 (four) hours as needed for moderate pain.   Ibuprofen 200 MG Caps Commonly known as: Advil Take 3 capsules (600 mg total) by mouth daily as needed (Moderate pain). What changed:  when to take this reasons to take this   levothyroxine 25 MCG tablet Commonly known as: SYNTHROID Take 25 mcg by mouth daily before breakfast.   lisinopril 10 MG tablet Commonly known as: ZESTRIL Take 10 mg by mouth daily.   multivitamin with minerals Tabs tablet Take 1 tablet by mouth  daily.   nystatin-triamcinolone ointment Commonly known as: MYCOLOG Apply 1 application topically 2 (two) times daily. Apply to left arm pit   pantoprazole 40 MG tablet Commonly known as: PROTONIX Take 40 mg by mouth daily.   traMADol 50 MG tablet Commonly known as: ULTRAM Take 1 tablet (50 mg total) by mouth every 8 (eight) hours as needed for severe pain. What changed: reasons to take this        Today   VITAL SIGNS:  Blood pressure 130/68, pulse 76, temperature 98.4 F (36.9 C), temperature source Oral, resp. rate 18, height 5\' 4"  (1.626 m), weight 78.6 kg, SpO2 95 %.  I/O:  No intake or output data in the 24 hours ending 12/18/18 1329  PHYSICAL EXAMINATION:  Physical Exam  GENERAL:  78 y.o.-year-old patient lying in the bed with no acute distress.  LUNGS: Normal breath sounds bilaterally, no wheezing, rales,rhonchi or crepitation. No use of accessory muscles of respiration.  CARDIOVASCULAR: S1, S2 normal. No murmurs, rubs, or gallops.  ABDOMEN: Soft, non-tender, non-distended. Bowel sounds present. No organomegaly or mass.  NEUROLOGIC: Moves all 4 extremities. PSYCHIATRIC: The patient is alert and oriented x 3.  SKIN: No obvious rash, lesion, or ulcer.   DATA REVIEW:   CBC Recent Labs  Lab 12/13/18 0213  WBC 9.8  HGB 12.7  HCT 38.8  PLT 329    Chemistries  Recent Labs  Lab 12/13/18 0213  NA 136  K 3.8  CL 103  CO2 22  GLUCOSE 132*  BUN 18  CREATININE 0.80  CALCIUM 8.6*    Cardiac Enzymes No results for input(s): TROPONINI in the last 168 hours.  Microbiology Results  Results for orders placed or performed during the hospital encounter of 12/13/18  Urine Culture     Status: Abnormal   Collection Time: 12/13/18  3:37 AM   Specimen: Urine, Random  Result Value Ref Range Status   Specimen Description   Final    URINE, RANDOM Performed at [facility], [address] [place], [place] [address]    Special Requests   Final    NONE Performed at [facility], [address] [place], [place] [address]    Culture (A)  Final    >=100,000 COLONIES/mL LACTOBACILLUS SPECIES Standardized susceptibility testing for this organism is not available. Performed at [facility], [place] [address], [place],  [address]    Report Status 12/14/2018 FINAL  Final  SARS Coronavirus 2 (CEPHEID - Performed in [place] hospital  lab), Hosp Order     Status: None   Collection Time: 12/13/18  4:32 AM   Specimen: Nasopharyngeal Swab  Result Value Ref Range Status   SARS Coronavirus 2 NEGATIVE NEGATIVE Final    Comment: (NOTE) If result is NEGATIVE SARS-CoV-2 target nucleic acids are NOT DETECTED. The SARS-CoV-2 RNA is generally detectable in upper and lower  respiratory specimens during the acute phase of infection. The lowest  concentration of SARS-CoV-2 viral copies this assay can detect is 250  copies / mL. A negative result does not preclude SARS-CoV-2 infection  and should not be used as the sole basis for treatment or other  patient management decisions.  A negative result may occur with  improper specimen collection / handling, submission of specimen other  than nasopharyngeal swab, presence of viral mutation(s) within the  areas targeted by this assay, and inadequate number of viral copies  (<250 copies / mL). A negative result must be combined with clinical  observations, patient history, and epidemiological information. If result is POSITIVE SARS-CoV-2 target nucleic acids are DETECTED. The SARS-CoV-2 RNA is generally detectable in upper and lower  respiratory specimens dur ing the acute phase of infection.  Positive  results are indicative of active infection with SARS-CoV-2.  Clinical  correlation with patient history and other diagnostic information is  necessary to determine patient infection status.  Positive results do  not rule out bacterial infection or co-infection with other viruses. If result is PRESUMPTIVE POSTIVE SARS-CoV-2 nucleic acids MAY BE PRESENT.   A presumptive positive result was obtained on the submitted specimen  and confirmed on repeat testing.  While 2019 novel coronavirus  (SARS-CoV-2) nucleic acids may be present in the submitted sample  additional confirmatory testing may be necessary for epidemiological  and / or clinical management purposes  to differentiate between   SARS-CoV-2 and other Sarbecovirus currently known to infect humans.  If clinically indicated additional testing with an alternate test  methodology [phone]([id number]) is advised. The SARS-CoV-2 RNA is generally  detectable in upper and lower respiratory sp ecimens during the acute  phase of infection. The expected result is Negative. Fact Sheet for Patients:  BoilerBrush.com.cyhttps://www.fda.gov/media/136312/download Fact Sheet for Healthcare Providers: https://pope.com/https://www.fda.gov/media/136313/download This test is not yet approved or cleared by the [place] FDA and has been authorized for detection and/or diagnosis of SARS-CoV-2 by FDA under an Emergency Use Authorization (EUA).  This EUA will remain in effect (meaning this test can be used) for the duration of the COVID-19 declaration under Section 564(b)(1) of the Act, 21 U.S.C. section 360bbb-3(b)(1), unless the authorization is terminated or revoked sooner. Performed at [facility], [address] [place], [place] [address]     RADIOLOGY:  No results found.  Follow up with PCP in 1 week.  Management plans discussed with the patient, family and they are in agreement.  CODE STATUS:  Code Status History    Date Active Date Inactive Code Status Order ID Comments User Context   12/13/2018 0701 12/15/2018 1955 Full Code [id number]  Arnaldo Nataliamond, Michael S, MD Inpatient   09/11/2017 2017 09/14/2017 1731 Full Code [id number]  Shaune Pollackhen, Qing, MD Inpatient   12/29/2015 0106 01/03/2016 1704 Full Code [id number]  Oralia ManisWillis, David, MD Inpatient   07/04/2015 1848 07/07/2015 2124 Full Code [id number]  Alford HighlandWieting, Richard, MD ED   Advance Care Planning Activity      TOTAL TIME TAKING CARE OF THIS PATIENT ON DAY OF DISCHARGE: more than 30 minutes.   Molinda BailiffSrikar R Neeley Sedivy M.D on 12/18/2018 at 1:29 PM  Between 7am to 6pm - Pager - [phone]  After 6pm go to [email or web address] - password [email or web address] [facility]  [facility] [place] [facility]  Office  [phone]  CC: Primary care physician;  Marisue IvanLinthavong, Kanhka, MD  Note: This dictation was prepared with Dragon dictation along with smaller phrase technology. Any transcriptional errors that result from this process are unintentional.

## 2019-04-22 ENCOUNTER — Emergency Department: Payer: Medicare Other

## 2019-04-22 ENCOUNTER — Inpatient Hospital Stay
Admission: EM | Admit: 2019-04-22 | Discharge: 2019-04-25 | DRG: 690 | Disposition: A | Discharge status: Home Health | Payer: Medicare Other | Attending: Internal Medicine | Admitting: Internal Medicine

## 2019-04-22 ENCOUNTER — Encounter: Payer: Self-pay | Admitting: Emergency Medicine

## 2019-04-22 ENCOUNTER — Other Ambulatory Visit: Payer: Self-pay

## 2019-04-22 DIAGNOSIS — E785 Hyperlipidemia, unspecified: Secondary | ICD-10-CM | POA: Diagnosis present

## 2019-04-22 DIAGNOSIS — E039 Hypothyroidism, unspecified: Secondary | ICD-10-CM | POA: Diagnosis not present

## 2019-04-22 DIAGNOSIS — R55 Syncope and collapse: Secondary | ICD-10-CM | POA: Diagnosis not present

## 2019-04-22 DIAGNOSIS — Z79890 Hormone replacement therapy (postmenopausal): Secondary | ICD-10-CM

## 2019-04-22 DIAGNOSIS — Z881 Allergy status to other antibiotic agents status: Secondary | ICD-10-CM

## 2019-04-22 DIAGNOSIS — F329 Major depressive disorder, single episode, unspecified: Secondary | ICD-10-CM | POA: Diagnosis present

## 2019-04-22 DIAGNOSIS — Z79899 Other long term (current) drug therapy: Secondary | ICD-10-CM

## 2019-04-22 DIAGNOSIS — F32A Depression, unspecified: Secondary | ICD-10-CM | POA: Diagnosis present

## 2019-04-22 DIAGNOSIS — Z8249 Family history of ischemic heart disease and other diseases of the circulatory system: Secondary | ICD-10-CM

## 2019-04-22 DIAGNOSIS — Z20828 Contact with and (suspected) exposure to other viral communicable diseases: Secondary | ICD-10-CM | POA: Diagnosis present

## 2019-04-22 DIAGNOSIS — B9620 Unspecified Escherichia coli [E. coli] as the cause of diseases classified elsewhere: Secondary | ICD-10-CM | POA: Diagnosis present

## 2019-04-22 DIAGNOSIS — I10 Essential (primary) hypertension: Secondary | ICD-10-CM

## 2019-04-22 DIAGNOSIS — N390 Urinary tract infection, site not specified: Principal | ICD-10-CM | POA: Diagnosis present

## 2019-04-22 DIAGNOSIS — K219 Gastro-esophageal reflux disease without esophagitis: Secondary | ICD-10-CM | POA: Diagnosis present

## 2019-04-22 DIAGNOSIS — E860 Dehydration: Secondary | ICD-10-CM | POA: Diagnosis present

## 2019-04-22 DIAGNOSIS — N3000 Acute cystitis without hematuria: Secondary | ICD-10-CM | POA: Diagnosis not present

## 2019-04-22 DIAGNOSIS — K5900 Constipation, unspecified: Secondary | ICD-10-CM | POA: Diagnosis present

## 2019-04-22 DIAGNOSIS — R569 Unspecified convulsions: Secondary | ICD-10-CM | POA: Diagnosis present

## 2019-04-22 DIAGNOSIS — Z87440 Personal history of urinary (tract) infections: Secondary | ICD-10-CM

## 2019-04-22 DIAGNOSIS — G629 Polyneuropathy, unspecified: Secondary | ICD-10-CM | POA: Diagnosis present

## 2019-04-22 LAB — BASIC METABOLIC PANEL
Anion gap: 10 (ref 5–15)
BUN: 21 mg/dL (ref 8–23)
CO2: 26 mmol/L (ref 22–32)
Calcium: 9.1 mg/dL (ref 8.9–10.3)
Chloride: 101 mmol/L (ref 98–111)
Creatinine, Ser: 0.95 mg/dL (ref 0.44–1.00)
GFR calc Af Amer: >60 mL/min (ref >60)
GFR calc non Af Amer: 58 mL/min — ABNORMAL LOW (ref >60)
Glucose, Bld: 93 mg/dL (ref 70–99)
Potassium: 3.8 mmol/L (ref 3.5–5.1)
Sodium: 137 mmol/L (ref 135–145)

## 2019-04-22 LAB — URINALYSIS, COMPLETE (UACMP) WITH MICROSCOPIC
Bilirubin Urine: NEGATIVE
Glucose, UA: NEGATIVE mg/dL
Hgb urine dipstick: NEGATIVE
Ketones, ur: NEGATIVE mg/dL
Nitrite: POSITIVE — AB
Protein, ur: 30 mg/dL — AB
Specific Gravity, Urine: 1.016 (ref 1.005–1.030)
WBC, UA: >50 WBC/hpf — ABNORMAL HIGH (ref 0–5)
pH: 5 (ref 5.0–8.0)

## 2019-04-22 LAB — CBC
HCT: 38.8 % (ref 36.0–46.0)
Hemoglobin: 12.5 g/dL (ref 12.0–15.0)
MCH: 31.3 pg (ref 26.0–34.0)
MCHC: 32.2 g/dL (ref 30.0–36.0)
MCV: 97.2 fL (ref 80.0–100.0)
Platelets: 327 10*3/uL (ref 150–400)
RBC: 3.99 MIL/uL (ref 3.87–5.11)
RDW: 12.1 % (ref 11.5–15.5)
WBC: 10.7 10*3/uL — ABNORMAL HIGH (ref 4.0–10.5)
nRBC: 0 % (ref 0.0–0.2)

## 2019-04-22 LAB — TROPONIN I (HIGH SENSITIVITY): Troponin I (High Sensitivity): 7 ng/L (ref <18)

## 2019-04-22 MED ORDER — SODIUM CHLORIDE 0.9 % IV SOLN
1.0000 g | Freq: Once | INTRAVENOUS | Status: AC
  Administered 2019-04-22: 1 g via INTRAVENOUS
  Filled 2019-04-22: qty 10

## 2019-04-22 MED ORDER — SODIUM CHLORIDE 0.9% FLUSH
3.0000 mL | Freq: Two times a day (BID) | INTRAVENOUS | Status: DC
  Administered 2019-04-23: 3 mL via INTRAVENOUS

## 2019-04-22 MED ORDER — GABAPENTIN 100 MG PO CAPS
100.0000 mg | ORAL_CAPSULE | Freq: Two times a day (BID) | ORAL | Status: DC
  Administered 2019-04-22 – 2019-04-25 (×6): 100 mg via ORAL
  Filled 2019-04-22 (×8): qty 1

## 2019-04-22 MED ORDER — ONDANSETRON HCL 4 MG/2ML IJ SOLN: 4.0000 mg | Freq: Four times a day (QID) | INTRAMUSCULAR | Status: DC | PRN

## 2019-04-22 MED ORDER — MAGNESIUM HYDROXIDE 400 MG/5ML PO SUSP
30.0000 mL | Freq: Every day | ORAL | Status: DC | PRN
  Administered 2019-04-23 – 2019-04-24 (×2): 30 mL via ORAL
  Filled 2019-04-22 (×2): qty 30

## 2019-04-22 MED ORDER — ACETAMINOPHEN 650 MG RE SUPP: 650.0000 mg | Freq: Four times a day (QID) | RECTAL | Status: DC | PRN

## 2019-04-22 MED ORDER — ALUM & MAG HYDROXIDE-SIMETH 200-200-20 MG/5ML PO SUSP: 30.0000 mL | Freq: Four times a day (QID) | ORAL | Status: DC | PRN

## 2019-04-22 MED ORDER — LEVOTHYROXINE SODIUM 25 MCG PO TABS
25.0000 ug | ORAL_TABLET | Freq: Every day | ORAL | Status: DC
  Administered 2019-04-23 – 2019-04-25 (×3): 25 ug via ORAL
  Filled 2019-04-22 (×3): qty 1

## 2019-04-22 MED ORDER — TRAZODONE HCL 50 MG PO TABS: 25.0000 mg | ORAL_TABLET | Freq: Every evening | ORAL | Status: DC | PRN

## 2019-04-22 MED ORDER — ESTROGENS, CONJUGATED 0.625 MG/GM VA CREA
1.0000 | TOPICAL_CREAM | VAGINAL | Status: DC
  Filled 2019-04-22: qty 30

## 2019-04-22 MED ORDER — ADULT MULTIVITAMIN W/MINERALS CH
1.0000 | ORAL_TABLET | Freq: Every day | ORAL | Status: DC
  Administered 2019-04-23 – 2019-04-25 (×3): 1 via ORAL
  Filled 2019-04-22 (×3): qty 1

## 2019-04-22 MED ORDER — ATORVASTATIN CALCIUM 20 MG PO TABS
40.0000 mg | ORAL_TABLET | Freq: Every day | ORAL | Status: DC
  Administered 2019-04-22 – 2019-04-25 (×4): 40 mg via ORAL
  Filled 2019-04-22 (×4): qty 2

## 2019-04-22 MED ORDER — HYDROCODONE-ACETAMINOPHEN 5-325 MG PO TABS
1.0000 | ORAL_TABLET | ORAL | Status: DC | PRN
  Administered 2019-04-23 – 2019-04-24 (×3): 1 via ORAL
  Filled 2019-04-22 (×3): qty 1

## 2019-04-22 MED ORDER — IBUPROFEN 400 MG PO TABS
400.0000 mg | ORAL_TABLET | Freq: Once | ORAL | Status: AC
  Administered 2019-04-22: 400 mg via ORAL
  Filled 2019-04-22: qty 1

## 2019-04-22 MED ORDER — ENOXAPARIN SODIUM 40 MG/0.4ML ~~LOC~~ SOLN
40.0000 mg | SUBCUTANEOUS | Status: DC
  Administered 2019-04-22 – 2019-04-24 (×3): 40 mg via SUBCUTANEOUS
  Filled 2019-04-22 (×3): qty 0.4

## 2019-04-22 MED ORDER — SODIUM CHLORIDE 0.9 % IV SOLN
1.0000 g | INTRAVENOUS | Status: DC
  Administered 2019-04-23 – 2019-04-24 (×2): 1 g via INTRAVENOUS
  Filled 2019-04-22: qty 1
  Filled 2019-04-22: qty 10
  Filled 2019-04-22: qty 1

## 2019-04-22 MED ORDER — TRAMADOL HCL 50 MG PO TABS
50.0000 mg | ORAL_TABLET | Freq: Three times a day (TID) | ORAL | Status: DC | PRN
  Administered 2019-04-23 (×2): 50 mg via ORAL
  Filled 2019-04-22 (×5): qty 1

## 2019-04-22 MED ORDER — LISINOPRIL 10 MG PO TABS
10.0000 mg | ORAL_TABLET | Freq: Every day | ORAL | Status: DC
  Administered 2019-04-23 – 2019-04-25 (×3): 10 mg via ORAL
  Filled 2019-04-22 (×3): qty 1

## 2019-04-22 MED ORDER — PANTOPRAZOLE SODIUM 40 MG PO TBEC
40.0000 mg | DELAYED_RELEASE_TABLET | Freq: Every day | ORAL | Status: DC
  Administered 2019-04-23 – 2019-04-25 (×3): 40 mg via ORAL
  Filled 2019-04-22 (×3): qty 1

## 2019-04-22 MED ORDER — ESCITALOPRAM OXALATE 10 MG PO TABS
10.0000 mg | ORAL_TABLET | Freq: Every day | ORAL | Status: DC
  Administered 2019-04-23 – 2019-04-25 (×3): 10 mg via ORAL
  Filled 2019-04-22 (×3): qty 1

## 2019-04-22 MED ORDER — SODIUM CHLORIDE 0.9 % IV SOLN
INTRAVENOUS | Status: DC
  Administered 2019-04-22 – 2019-04-24 (×4): via INTRAVENOUS

## 2019-04-22 MED ORDER — ONDANSETRON HCL 4 MG PO TABS: 4.0000 mg | ORAL_TABLET | Freq: Four times a day (QID) | ORAL | Status: DC | PRN

## 2019-04-22 MED ORDER — ACETAMINOPHEN 325 MG PO TABS: 650.0000 mg | ORAL_TABLET | Freq: Four times a day (QID) | ORAL | Status: DC | PRN

## 2019-04-22 NOTE — H&P (Addendum)
[place] at [place] NAME: Elizabeth Shepard    MR#:  [id number]  DATE OF BIRTH:  June 12, 1941  DATE OF ADMISSION:  04/22/2019  PRIMARY CARE PHYSICIAN: Dion Body, MD   REQUESTING/REFERRING PHYSICIAN: Marjean Donna, MD  CHIEF COMPLAINT:  I felt weak (The patient was seen and examined on 04/22/2019) HISTORY OF PRESENT ILLNESS:  Elizabeth Shepard  is a 78 y.o. Caucasian female with a known history of hypertension, hypothyroidism, depression and dyslipidemia, presented to the emergency room with acute onset of generalized weakness and presyncope with subsequent fall and had a seizure without loss of consciousness or injuries.  She denied any headache or dizziness or paresthesias or focal muscle weakness.  No chest pain or palpitations.  She admitted to urinary frequency and urgency without significant dysuria for about a week.  No hematuria or flank pain.  No cough or wheezing or shortness of breath.  She denied any fever or chills.  Upon presentation to the emergency room, vital signs were within normal.  Labs revealed positive UA for UTI.  Urine culture was sent.  Two-view chest x-ray showed bronchitic changes with right basal atelectasis and hiatal hernia.  EKG showed sinus rhythm with a rate of 83 with sinus arrhythmia, PVCs and left atrial enlargement.  The patient was given a gram of IV Rocephin.  She will be admitted to a medically monitored observation bed for further evaluation and management. PAST MEDICAL HISTORY:   Past Medical History:  Diagnosis Date  . Depression   . Generalized pain   . Hypercholesteremia   . Hypertension   . Hypothyroidism   . Thyroid disease   . UTI (lower urinary tract infection)     PAST SURGICAL HISTORY:   Past Surgical History:  Procedure Laterality Date  . AUGMENTATION MAMMAPLASTY Bilateral 1980  . BREAST BIOPSY Right 1970   neg  . COLONOSCOPY WITH PROPOFOL N/A 09/12/2015   Procedure: COLONOSCOPY WITH PROPOFOL;  Surgeon:  Lollie Sails, MD;  Location[facility] ENDOSCOPY;  Service: Endoscopy;  Laterality: N/A;  . ESOPHAGOGASTRODUODENOSCOPY N/A 07/06/2015   Procedure: ESOPHAGOGASTRODUODENOSCOPY (EGD);  Surgeon: Lollie Sails, MD;  Location: [facility] ENDOSCOPY;  Service: Endoscopy;  Laterality: N/A;  . ESOPHAGOGASTRODUODENOSCOPY (EGD) WITH PROPOFOL N/A 09/12/2015   Procedure: ESOPHAGOGASTRODUODENOSCOPY (EGD) WITH PROPOFOL;  Surgeon: Lollie Sails, MD;  Location: [facility] ENDOSCOPY;  Service: Endoscopy;  Laterality: N/A;  . HIP SURGERY    . Mastoid surgery    . TONSILLECTOMY    . TOTAL HIP ARTHROPLASTY Bilateral     SOCIAL HISTORY:   Social History   Tobacco Use  . Smoking status: Never Smoker  . Smokeless tobacco: Never Used  Substance Use Topics  . Alcohol use: No    FAMILY HISTORY:   Family History  Problem Relation Age of Onset  . CAD Mother   . Diabetes Mother   . CAD Father   . Diabetes Father   . Bladder Cancer Neg Hx   . Prostate cancer Neg Hx   . Kidney cancer Neg Hx     DRUG ALLERGIES:   Allergies  Allergen Reactions  . Ciprofloxacin Itching and Rash    REVIEW OF SYSTEMS:   ROS As per history of present illness. All pertinent systems were reviewed above. Constitutional,  HEENT, cardiovascular, respiratory, GI, GU, musculoskeletal, neuro, psychiatric, endocrine,  integumentary and hematologic systems were reviewed and are otherwise  negative/unremarkable except for positive findings mentioned above in the HPI.   MEDICATIONS AT HOME:  Prior to Admission medications   Medication Sig Start Date End Date Taking? Authorizing Provider  atorvastatin (LIPITOR) 40 MG tablet Take 40 mg by mouth daily.    [provider]  conjugated estrogens (PREMARIN) vaginal cream Place 1 Applicatorful vaginally 2 (two) times a week.     [provider]  escitalopram (LEXAPRO) 10 MG tablet Take 10 mg by mouth daily. 11/16/18   [provider]  gabapentin (NEURONTIN) 100 MG  capsule Take 1 capsule (100 mg total) by mouth 2 (two) times daily. 12/15/18   Milagros LollSudini, Srikar, MD  HYDROcodone-acetaminophen (NORCO/VICODIN) 5-325 MG tablet Take 1 tablet by mouth every 4 (four) hours as needed for moderate pain. 09/14/17   Alford HighlandWieting, Richard, MD  Ibuprofen (ADVIL) 200 MG CAPS Take 3 capsules (600 mg total) by mouth daily as needed (Moderate pain). 12/15/18   Milagros LollSudini, Srikar, MD  levothyroxine (SYNTHROID, LEVOTHROID) 25 MCG tablet Take 25 mcg by mouth daily before breakfast.    [provider]  lisinopril (PRINIVIL,ZESTRIL) 10 MG tablet Take 10 mg by mouth daily.    [provider]  Multiple Vitamin (MULTIVITAMIN WITH MINERALS) TABS tablet Take 1 tablet by mouth daily.    [provider]  nystatin-triamcinolone ointment (MYCOLOG) Apply 1 application topically 2 (two) times daily. Apply to left arm pit 12/15/18   Sudini, Wardell HeathSrikar, MD  pantoprazole (PROTONIX) 40 MG tablet Take 40 mg by mouth daily. 11/26/18   [provider]  traMADol (ULTRAM) 50 MG tablet Take 1 tablet (50 mg total) by mouth every 8 (eight) hours as needed for severe pain. 12/15/18   Milagros LollSudini, Srikar, MD      VITAL SIGNS:  Blood pressure 137/66, pulse 76, temperature 98.3 F (36.8 C), temperature source Oral, resp. rate 18, height 5\' 4"  (1.626 m), weight 77.1 kg, SpO2 96 %.  PHYSICAL EXAMINATION:  Physical Exam  GENERAL:  78 y.o.-year-old Caucasian female patient lying in the bed with no acute distress.  EYES: Pupils equal, round, reactive to light and accommodation. No scleral icterus. Extraocular muscles intact.  HEENT: Head atraumatic, normocephalic. Oropharynx and nasopharynx clear.  NECK:  Supple, no jugular venous distention. No thyroid enlargement, no tenderness.  LUNGS: Normal breath sounds bilaterally, no wheezing, rales,rhonchi or crepitation. No use of accessory muscles of respiration.  CARDIOVASCULAR: Regular rate and rhythm, S1, S2 normal. No murmurs, rubs, or gallops.   ABDOMEN: Soft, nondistended, nontender. Bowel sounds present. No organomegaly or mass.  EXTREMITIES: No pedal edema, cyanosis, or clubbing.  NEUROLOGIC: Cranial nerves II through XII are intact. Muscle strength 5/5 in all extremities. Sensation intact. Gait not checked.  PSYCHIATRIC: The patient is alert and oriented x 3.  Normal affect and good eye contact. SKIN: No obvious rash, lesion, or ulcer.   LABORATORY PANEL:   CBC Recent Labs  Lab 04/22/19 1528  WBC 10.7*  HGB 12.5  HCT 38.8  PLT 327   ------------------------------------------------------------------------------------------------------------------  Chemistries  Recent Labs  Lab 04/22/19 1528  NA 137  K 3.8  CL 101  CO2 26  GLUCOSE 93  BUN 21  CREATININE 0.95  CALCIUM 9.1   ------------------------------------------------------------------------------------------------------------------  Cardiac Enzymes No results for input(s): TROPONINI in the last 168 hours. ------------------------------------------------------------------------------------------------------------------  RADIOLOGY:  Dg Chest 2 View  Result Date: 04/22/2019 CLINICAL DATA:  Became lightheaded walking around grocery store, dizziness, near syncopal, irregular EKG with runs of PVCs EXAM: CHEST - 2 VIEW COMPARISON:  09/11/2017 FINDINGS: Normal heart size and pulmonary vascularity. Moderate-sized hiatal hernia. Atherosclerotic calcification aorta. Bronchitic changes  with RIGHT basilar atelectasis. Remaining lungs clear. No pleural effusion or pneumothorax. Bones demineralized with LEFT glenohumeral degenerative changes seen. IMPRESSION: Bronchitic changes with RIGHT basilar atelectasis. Hiatal hernia. Electronically Signed   By: Ulyses Southward M.D.   On: 04/22/2019 16:50      IMPRESSION AND PLAN:   1.  Presyncope.The patient will be admitted to an observation medically monitored bed.  Will check orthostatics q 12 hours.  Will obtain a bilateral  carotid Doppler and 2D echo.  The patient will be gently hydrated with IV normal saline and monitored for arrhythmias. Differential diagnosis would include neurally mediated syncope, cardiogenic, arrhythmias related,  orthostatic hypotension and less likely hypoglycemia.  2.  UTI.  The patient will be placed on IV Rocephin and will follow urine culture and sensitivity.  3.  Hypothyroidism.  The patient will be placed on Synthroid and will check TSH.  4.  Hypertension.  We will continue lisinopril.  5.  Peripheral neuropathy.  We will continue Neurontin.  6.  Depression.  Continue Lexapro.  7.  DVT prophylaxis.  Subtenons Lovenox    All the records are reviewed and case discussed with ED provider. The plan of care was discussed in details with the patient (and family). I answered all questions. The patient agreed to proceed with the above mentioned plan. Further management will depend upon hospital course.   CODE STATUS: Full code  TOTAL TIME TAKING CARE OF THIS PATIENT: 50 minutes.    Hannah Beat M.D on 04/22/2019 at 9:13 PM  [facility]   From 7 PM-7 AM, contact night-coverage [email or web address]  CC: Primary care physician; Marisue Ivan, MD   Note: This dictation was prepared with Dragon dictation along with smaller phrase technology. Any transcriptional errors that result from this process are unintentional.

## 2019-04-22 NOTE — ED Triage Notes (Signed)
Pt in via EMS from [facility]. Per EMS, pt was walking around and became lightheaded and dizzy and almost fainted. She was helped to the ground. EMS reports patients EKG irregular with runs of PVC's.  Pt reports she just felt weak all of a sudden.

## 2019-04-22 NOTE — ED Provider Notes (Signed)
[facility] Emergency Department Provider Note    First MD Initiated Contact with Patient 04/22/19 2020     (approximate)  I have reviewed the triage vital signs and the nursing notes.   HISTORY  Chief Complaint No chief complaint on file.    HPI Elizabeth Shepard is a 78 y.o. female with below list of previous medical conditions presents to the emergency department secondary to episode today at [employer] where the patient states that I just felt weak and went down".  Patient denies any preceding symptoms no chest pain nausea vomiting diarrhea constipation.  Patient denies any headache no dizziness or speech.  Patient states he just felt weak".  Patient does admit to frequent urination.  Patient denies any abdominal pain.  Patient denies any back pain no fever.        Past Medical History:  Diagnosis Date  . Depression   . Generalized pain   . Hypercholesteremia   . Hypertension   . Hypothyroidism   . Thyroid disease   . UTI (lower urinary tract infection)     Patient Active Problem List   Diagnosis Date Noted  . 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 Sepsis (HCC) 12/30/2015  . UTI (lower urinary tract infection) 12/29/2015  . Hypothyroidism 12/29/2015  . Weakness 12/29/2015  . Abnormal EKG 12/29/2015  . Allergic rhinitis 09/20/2015  . Clinical depression 09/20/2015  . HLD (hyperlipidemia) 09/20/2015  . BP (high blood pressure) 09/20/2015  . Anemia 07/04/2015  . Acute hemorrhagic cystitis 11/07/2014    Past Surgical History:  Procedure Laterality Date  . AUGMENTATION MAMMAPLASTY Bilateral 1980  . BREAST BIOPSY Right 1970   neg  . COLONOSCOPY WITH PROPOFOL N/A 09/12/2015   Procedure: COLONOSCOPY WITH PROPOFOL;  Surgeon: Christena DeemMartin U Skulskie, MD;  [facility];  Service: Endoscopy;  Laterality: N/A;  . ESOPHAGOGASTRODUODENOSCOPY N/A 07/06/2015   Procedure: ESOPHAGOGASTRODUODENOSCOPY (EGD);  Surgeon: Christena DeemMartin U  Skulskie, MD;  [facility];  Service: Endoscopy;  Laterality: N/A;  . ESOPHAGOGASTRODUODENOSCOPY (EGD) WITH PROPOFOL N/A 09/12/2015   Procedure: ESOPHAGOGASTRODUODENOSCOPY (EGD) WITH PROPOFOL;  Surgeon: Christena DeemMartin U Skulskie, MD;  [facility];  Service: Endoscopy;  Laterality: N/A;  . HIP SURGERY    . Mastoid surgery    . TONSILLECTOMY    . TOTAL HIP ARTHROPLASTY Bilateral     Prior to Admission medications   Medication Sig Start Date End Date Taking? Authorizing Provider  atorvastatin (LIPITOR) 40 MG tablet Take 40 mg by mouth daily.    [provider]  conjugated estrogens (PREMARIN) vaginal cream Place 1 Applicatorful vaginally 2 (two) times a week.     [provider]  escitalopram (LEXAPRO) 10 MG tablet Take 10 mg by mouth daily. 11/16/18   [provider]  gabapentin (NEURONTIN) 100 MG capsule Take 1 capsule (100 mg total) by mouth 2 (two) times daily. 12/15/18   Milagros LollSudini, Srikar, MD  HYDROcodone-acetaminophen (NORCO/VICODIN) 5-325 MG tablet Take 1 tablet by mouth every 4 (four) hours as needed for moderate pain. 09/14/17   Alford HighlandWieting, Richard, MD  Ibuprofen (ADVIL) 200 MG CAPS Take 3 capsules (600 mg total) by mouth daily as needed (Moderate pain). 12/15/18   Milagros LollSudini, Srikar, MD  levothyroxine (SYNTHROID, LEVOTHROID) 25 MCG tablet Take 25 mcg by mouth daily before breakfast.    [provider]  lisinopril (PRINIVIL,ZESTRIL) 10 MG tablet Take 10 mg by mouth daily.    [provider]  Multiple Vitamin (MULTIVITAMIN WITH MINERALS) TABS  tablet Take 1 tablet by mouth daily.    [provider]  nystatin-triamcinolone ointment (MYCOLOG) Apply 1 application topically 2 (two) times daily. Apply to left arm pit 12/15/18   Sudini, Alveta Heimlich, MD  pantoprazole (PROTONIX) 40 MG tablet Take 40 mg by mouth daily. 11/26/18   [provider]  traMADol (ULTRAM) 50 MG tablet Take 1 tablet (50 mg total) by mouth every 8 (eight) hours as  needed for severe pain. 12/15/18   Hillary Bow, MD    Allergies Ciprofloxacin  Family History  Problem Relation Age of Onset  . CAD Mother   . Diabetes Mother   . CAD Father   . Diabetes Father   . Bladder Cancer Neg Hx   . Prostate cancer Neg Hx   . Kidney cancer Neg Hx     Social History Social History   Tobacco Use  . Smoking status: Never Smoker  . Smokeless tobacco: Never Used  Substance Use Topics  . Alcohol use: No  . Drug use: No    Review of Systems Constitutional: No fever/chills Eyes: No visual changes. ENT: No sore throat. Cardiovascular: Denies chest pain. Respiratory: Denies shortness of breath. Gastrointestinal: No abdominal pain.  No nausea, no vomiting.  No diarrhea.  No constipation. Genitourinary: Negative for dysuria.  Positive for urinary frequency and urgency Musculoskeletal: Negative for neck pain.  Negative for back pain. Integumentary: Negative for rash. Neurological: Negative for headaches, focal weakness or numbness.   ____________________________________________   PHYSICAL EXAM:  VITAL SIGNS: ED Triage Vitals  Enc Vitals Group     BP 04/22/19 1541 137/66     Pulse Rate 04/22/19 1541 76     Resp 04/22/19 1541 18     Temp 04/22/19 1541 98.3 F (36.8 C)     Temp Source 04/22/19 1541 Oral     SpO2 04/22/19 1541 96 %     Weight 04/22/19 1523 77.1 kg (170 lb)     Height 04/22/19 1523 1.626 m (5\' 4" )     Head Circumference --      Peak Flow --      Pain Score 04/22/19 1522 0     Pain Loc --      Pain Edu? --      Excl. in [place]? --     Constitutional: Alert and oriented.  Eyes: Conjunctivae are normal.  Mouth/Throat: Patient is wearing a mask. Neck: No stridor.  No meningeal signs.   Cardiovascular: Normal rate, regular rhythm. Good peripheral circulation. Grossly normal heart sounds. Respiratory: Normal respiratory effort.  No retractions. Gastrointestinal: Soft and nontender. No distention.  Musculoskeletal: No lower  extremity tenderness nor edema. No gross deformities of extremities. Neurologic:  Normal speech and language. No gross focal neurologic deficits are appreciated.  Skin:  Skin is warm, dry and intact. Psychiatric: Mood and affect are normal. Speech and behavior are normal.  ____________________________________________   LABS (all labs ordered are listed, but only abnormal results are displayed)  Labs Reviewed  BASIC METABOLIC PANEL - Abnormal; Notable for the following components:      Result Value   GFR calc non Af Amer 58 (*)    All other components within normal limits  CBC - Abnormal; Notable for the following components:   WBC 10.7 (*)    All other components within normal limits  URINALYSIS, COMPLETE (UACMP) WITH MICROSCOPIC - Abnormal; Notable for the following components:   Color, Urine YELLOW (*)    APPearance CLOUDY (*)    Protein,  ur 30 (*)    Nitrite POSITIVE (*)    Leukocytes,Ua LARGE (*)    WBC, UA >50 (*)    Bacteria, UA MANY (*)    Non Squamous Epithelial PRESENT (*)    All other components within normal limits  TROPONIN I (HIGH SENSITIVITY)  TROPONIN I (HIGH SENSITIVITY)   ____________________________________________  EKG  ED ECG REPORT I, [place] N Ubaldo Daywalt, the attending physician, personally viewed and interpreted this ECG.   Date: 04/22/2019  EKG Time: 3:54 PM  Rate: 83  Rhythm: Sinus arrhythmia, PVCs  Axis: Normal  Intervals: Normal  ST&T Change: None _____________________________  RADIOLOGY I, [place] N Jasdeep Kepner, personally viewed and evaluated these images (plain radiographs) as part of my medical decision making, as well as reviewing the written report by the radiologist.  ED MD interpretation: Bronchitic changes with right basilar atelectasis, hiatal hernia on chest x-ray per radiologist  Official radiology report(s): Dg Chest 2 View  Result Date: 04/22/2019 CLINICAL DATA:  Became lightheaded walking around grocery store, dizziness, near  syncopal, irregular EKG with runs of PVCs EXAM: CHEST - 2 VIEW COMPARISON:  09/11/2017 FINDINGS: Normal heart size and pulmonary vascularity. Moderate-sized hiatal hernia. Atherosclerotic calcification aorta. Bronchitic changes with RIGHT basilar atelectasis. Remaining lungs clear. No pleural effusion or pneumothorax. Bones demineralized with LEFT glenohumeral degenerative changes seen. IMPRESSION: Bronchitic changes with RIGHT basilar atelectasis. Hiatal hernia. Electronically Signed   By: Ulyses Southward M.D.   On: 04/22/2019 16:50     Procedures   ____________________________________________   INITIAL IMPRESSION / MDM / ASSESSMENT AND PLAN / ED COURSE  As part of my medical decision making, I reviewed the following data within the electronic MEDICAL RECORD NUMBER   78 year old female present with above-stated history and physical exam secondary to near syncopal episode.  EKG revealed sinus arrhythmia with PVCs EMS reports runs of PVCs unclear how many.  Laboratory data notable for urinary tract infection.  Patient will receive IV ceftriaxone urine culture pending.  Patient continues to have periodic PVCs in the emergency department concern for the possibility that the patient may have had a episode of V. tach given EMS report. Patient discussed with Dr. Arville Care  ____________________________________________  FINAL CLINICAL IMPRESSION(S) / ED DIAGNOSES  Final diagnoses:  Near syncope  Acute cystitis without hematuria     MEDICATIONS GIVEN DURING THIS VISIT:  Medications - No data to display   ED Discharge Orders    None      *Please note:  Elizabeth Shepard was evaluated in Emergency Department on 04/22/2019 for the symptoms described in the history of present illness. She was evaluated in the context of the global COVID-19 pandemic, which necessitated consideration that the patient might be at risk for infection with the SARS-CoV-2 virus that causes COVID-19. Institutional protocols and  algorithms that pertain to the evaluation of patients at risk for COVID-19 are in a state of rapid change based on information released by regulatory bodies including the CDC and federal and state organizations. These policies and algorithms were followed during the patient's care in the ED.  Some ED evaluations and interventions may be delayed as a result of limited staffing during the pandemic.*  Note:  This document was prepared using Dragon voice recognition software and may include unintentional dictation errors.   Darci Current, MD 04/22/19 [phone]

## 2019-04-23 ENCOUNTER — Observation Stay (HOSPITAL_COMMUNITY)
Admit: 2019-04-23 | Discharge: 2019-04-23 | Disposition: A | Discharge status: Home / Self Care | Payer: Medicare Other | Attending: Family Medicine | Admitting: Family Medicine

## 2019-04-23 ENCOUNTER — Observation Stay: Payer: Medicare Other

## 2019-04-23 ENCOUNTER — Encounter: Payer: Self-pay | Admitting: *Deleted

## 2019-04-23 DIAGNOSIS — E860 Dehydration: Secondary | ICD-10-CM | POA: Diagnosis present

## 2019-04-23 DIAGNOSIS — R569 Unspecified convulsions: Secondary | ICD-10-CM | POA: Diagnosis present

## 2019-04-23 DIAGNOSIS — K219 Gastro-esophageal reflux disease without esophagitis: Secondary | ICD-10-CM | POA: Diagnosis present

## 2019-04-23 DIAGNOSIS — E039 Hypothyroidism, unspecified: Secondary | ICD-10-CM | POA: Diagnosis present

## 2019-04-23 DIAGNOSIS — I10 Essential (primary) hypertension: Secondary | ICD-10-CM | POA: Diagnosis present

## 2019-04-23 DIAGNOSIS — R55 Syncope and collapse: Secondary | ICD-10-CM | POA: Diagnosis not present

## 2019-04-23 DIAGNOSIS — Z20828 Contact with and (suspected) exposure to other viral communicable diseases: Secondary | ICD-10-CM | POA: Diagnosis present

## 2019-04-23 DIAGNOSIS — N3000 Acute cystitis without hematuria: Secondary | ICD-10-CM | POA: Diagnosis present

## 2019-04-23 DIAGNOSIS — Z8249 Family history of ischemic heart disease and other diseases of the circulatory system: Secondary | ICD-10-CM | POA: Diagnosis not present

## 2019-04-23 DIAGNOSIS — Z881 Allergy status to other antibiotic agents status: Secondary | ICD-10-CM | POA: Diagnosis not present

## 2019-04-23 DIAGNOSIS — B9620 Unspecified Escherichia coli [E. coli] as the cause of diseases classified elsewhere: Secondary | ICD-10-CM | POA: Diagnosis present

## 2019-04-23 DIAGNOSIS — I340 Nonrheumatic mitral (valve) insufficiency: Secondary | ICD-10-CM

## 2019-04-23 DIAGNOSIS — E785 Hyperlipidemia, unspecified: Secondary | ICD-10-CM | POA: Diagnosis present

## 2019-04-23 DIAGNOSIS — Z87440 Personal history of urinary (tract) infections: Secondary | ICD-10-CM | POA: Diagnosis not present

## 2019-04-23 DIAGNOSIS — G629 Polyneuropathy, unspecified: Secondary | ICD-10-CM | POA: Diagnosis present

## 2019-04-23 DIAGNOSIS — Z79899 Other long term (current) drug therapy: Secondary | ICD-10-CM | POA: Diagnosis not present

## 2019-04-23 DIAGNOSIS — N390 Urinary tract infection, site not specified: Secondary | ICD-10-CM | POA: Diagnosis present

## 2019-04-23 DIAGNOSIS — Z79890 Hormone replacement therapy (postmenopausal): Secondary | ICD-10-CM | POA: Diagnosis not present

## 2019-04-23 DIAGNOSIS — K5900 Constipation, unspecified: Secondary | ICD-10-CM | POA: Diagnosis present

## 2019-04-23 DIAGNOSIS — F329 Major depressive disorder, single episode, unspecified: Secondary | ICD-10-CM | POA: Diagnosis present

## 2019-04-23 LAB — BASIC METABOLIC PANEL
Anion gap: 10 (ref 5–15)
BUN: 18 mg/dL (ref 8–23)
CO2: 27 mmol/L (ref 22–32)
Calcium: 8.9 mg/dL (ref 8.9–10.3)
Chloride: 103 mmol/L (ref 98–111)
Creatinine, Ser: 0.83 mg/dL (ref 0.44–1.00)
GFR calc Af Amer: >60 mL/min (ref >60)
GFR calc non Af Amer: >60 mL/min (ref >60)
Glucose, Bld: 167 mg/dL — ABNORMAL HIGH (ref 70–99)
Potassium: 3.8 mmol/L (ref 3.5–5.1)
Sodium: 140 mmol/L (ref 135–145)

## 2019-04-23 LAB — CBC
HCT: 38.3 % (ref 36.0–46.0)
Hemoglobin: 12.5 g/dL (ref 12.0–15.0)
MCH: 31.2 pg (ref 26.0–34.0)
MCHC: 32.6 g/dL (ref 30.0–36.0)
MCV: 95.5 fL (ref 80.0–100.0)
Platelets: 332 10*3/uL (ref 150–400)
RBC: 4.01 MIL/uL (ref 3.87–5.11)
RDW: 12 % (ref 11.5–15.5)
WBC: 7.8 10*3/uL (ref 4.0–10.5)
nRBC: 0 % (ref 0.0–0.2)

## 2019-04-23 LAB — TROPONIN I (HIGH SENSITIVITY): Troponin I (High Sensitivity): 10 ng/L (ref <18)

## 2019-04-23 LAB — ECHOCARDIOGRAM COMPLETE
Height: 64 in
Weight: 2720 oz

## 2019-04-23 LAB — SARS CORONAVIRUS 2 (TAT 6-24 HRS): SARS Coronavirus 2: NEGATIVE

## 2019-04-23 NOTE — Progress Notes (Signed)
*  PRELIMINARY RESULTS* Echocardiogram 2D Echocardiogram has been performed.  Elizabeth Shepard Elizabeth Shepard 04/23/2019, 11:21 AM

## 2019-04-23 NOTE — ED Notes (Signed)
ED TO INPATIENT HANDOFF REPORT  ED Nurse Name and Phone #: Karena Addison [phone]  S Name/Age/Gender Elizabeth Shepard 78 y.o. female Room/Bed: ED30A/ED30A  Code Status   Code Status: Full Code  Home/SNF/Other Home Patient oriented to: self, place, time and situation Is this baseline? Yes   Triage Complete: Triage complete  Chief Complaint Syncope  EMS  Triage Note Pt in via EMS from [employer]. Per EMS, pt was walking around and became lightheaded and dizzy and almost fainted. She was helped to the ground. EMS reports patients EKG irregular with runs of PVC's.  Pt reports she just felt weak all of a sudden.       Allergies Allergies  Allergen Reactions  . Ciprofloxacin Itching and Rash    Level of Care/Admitting Diagnosis ED Disposition    ED Disposition Condition [facility] Area: [place] [[id number]]  Level of Care: Med-Surg [16]  Covid Evaluation: Asymptomatic Screening Protocol (No Symptoms)  Diagnosis: UTI (urinary tract infection) [784696]  Admitting Physician: Christel Mormon [[id number]]  Attending Physician: Christel Mormon [[id number]]  PT Class (Do Not Modify): Observation [104]  PT Acc Code (Do Not Modify): Observation [10022]       B Medical/Surgery History Past Medical History:  Diagnosis Date  . Depression   . Generalized pain   . Hypercholesteremia   . Hypertension   . Hypothyroidism   . Thyroid disease   . UTI (lower urinary tract infection)    Past Surgical History:  Procedure Laterality Date  . AUGMENTATION MAMMAPLASTY Bilateral 1980  . BREAST BIOPSY Right 1970   neg  . COLONOSCOPY WITH PROPOFOL N/A 09/12/2015   Procedure: COLONOSCOPY WITH PROPOFOL;  Surgeon: Lollie Sails, MD;  Location: [facility] ENDOSCOPY;  Service: Endoscopy;  Laterality: N/A;  . ESOPHAGOGASTRODUODENOSCOPY N/A 07/06/2015   Procedure: ESOPHAGOGASTRODUODENOSCOPY (EGD);  Surgeon: Lollie Sails, MD;  Location: [facility] ENDOSCOPY;  Service: Endoscopy;   Laterality: N/A;  . ESOPHAGOGASTRODUODENOSCOPY (EGD) WITH PROPOFOL N/A 09/12/2015   Procedure: ESOPHAGOGASTRODUODENOSCOPY (EGD) WITH PROPOFOL;  Surgeon: Lollie Sails, MD;  Location: [facility] ENDOSCOPY;  Service: Endoscopy;  Laterality: N/A;  . HIP SURGERY    . Mastoid surgery    . TONSILLECTOMY    . TOTAL HIP ARTHROPLASTY Bilateral      A IV Location/Drains/Wounds Patient Lines/Drains/Airways Status   Active Line/Drains/Airways    Name:   Placement date:   Placement time:   Site:   Days:   Peripheral IV 04/22/19 Right Forearm   04/22/19    2128    Forearm   1   Wound / Incision (Open or Dehisced) 09/11/17 Laceration Face Left   09/11/17    2316    Face   589          Intake/Output Last 24 hours No intake or output data in the 24 hours ending 04/23/19 0105  Labs/Imaging Results for orders placed or performed during the hospital encounter of 04/22/19 (from the past 48 hour(s))  Basic metabolic panel     Status: Abnormal   Collection Time: 04/22/19  3:28 PM  Result Value Ref Range   Sodium 137 135 - 145 mmol/L   Potassium 3.8 3.5 - 5.1 mmol/L   Chloride 101 98 - 111 mmol/L   CO2 26 22 - 32 mmol/L   Glucose, Bld 93 70 - 99 mg/dL   BUN 21 8 - 23 mg/dL   Creatinine, Ser 0.95 0.44 - 1.00 mg/dL   Calcium 9.1 8.9 -  10.3 mg/dL   GFR calc non Af Amer 58 (L) >60 mL/min   GFR calc Af Amer >60 >60 mL/min   Anion gap 10 5 - 15    Comment: Performed at [facility], [address] [place], [place] [address]  CBC     Status: Abnormal   Collection Time: 04/22/19  3:28 PM  Result Value Ref Range   WBC 10.7 (H) 4.0 - 10.5 K/uL   RBC 3.99 3.87 - 5.11 MIL/uL   Hemoglobin 12.5 12.0 - 15.0 g/dL   HCT 29.538.8 62.136.0 - 30.846.0 %   MCV 97.2 80.0 - 100.0 fL   MCH 31.3 26.0 - 34.0 pg   MCHC 32.2 30.0 - 36.0 g/dL   RDW 65.712.1 84.611.5 - 96.215.5 %   Platelets 327 150 - 400 K/uL   nRBC 0.0 0.0 - 0.2 %    Comment: Performed at [facility], [address] [place], [place] [address]   Troponin I (High Sensitivity)     Status: None   Collection Time: 04/22/19  3:28 PM  Result Value Ref Range   Troponin I (High Sensitivity) 7 <18 ng/L    Comment: (NOTE) Elevated high sensitivity troponin I (hsTnI) values and significant  changes across serial measurements may suggest ACS but many other  chronic and acute conditions are known to elevate hsTnI results.  Refer to the "Links" section for chest pain algorithms and additional  guidance. Performed at [facility], [address] [place], [place] [address]   Urinalysis, Complete w Microscopic     Status: Abnormal   Collection Time: 04/22/19  4:06 PM  Result Value Ref Range   Color, Urine YELLOW (A) YELLOW   APPearance CLOUDY (A) CLEAR   Specific Gravity, Urine 1.016 1.005 - 1.030   pH 5.0 5.0 - 8.0   Glucose, UA NEGATIVE NEGATIVE mg/dL   Hgb urine dipstick NEGATIVE NEGATIVE   Bilirubin Urine NEGATIVE NEGATIVE   Ketones, ur NEGATIVE NEGATIVE mg/dL   Protein, ur 30 (A) NEGATIVE mg/dL   Nitrite POSITIVE (A) NEGATIVE   Leukocytes,Ua LARGE (A) NEGATIVE   RBC / HPF 6-10 0 - 5 RBC/hpf   WBC, UA >50 (H) 0 - 5 WBC/hpf   Bacteria, UA MANY (A) NONE SEEN   Squamous Epithelial / LPF 6-10 0 - 5   Mucus PRESENT    Non Squamous Epithelial PRESENT (A) NONE SEEN    Comment: Performed at [facility], [address] [place], [place] [address]   Dg Chest 2 View  Result Date: 04/22/2019 CLINICAL DATA:  Became lightheaded walking around grocery store, dizziness, near syncopal, irregular EKG with runs of PVCs EXAM: CHEST - 2 VIEW COMPARISON:  09/11/2017 FINDINGS: Normal heart size and pulmonary vascularity. Moderate-sized hiatal hernia. Atherosclerotic calcification aorta. Bronchitic changes with RIGHT basilar atelectasis. Remaining lungs clear. No pleural effusion or pneumothorax. Bones demineralized with LEFT glenohumeral degenerative changes seen. IMPRESSION: Bronchitic changes with RIGHT basilar atelectasis. Hiatal  hernia. Electronically Signed   By: Ulyses SouthwardMark  Boles M.D.   On: 04/22/2019 16:50    Pending Labs Unresulted Labs (From admission, onward)    Start     Ordered   04/23/19 0500  Basic metabolic panel  Tomorrow morning,   STAT     04/22/19 2121   04/23/19 0500  CBC  Tomorrow morning,   STAT     04/22/19 2121   04/22/19 2028  SARS CORONAVIRUS 2 (TAT 6-24 HRS) Nasopharyngeal Nasopharyngeal Swab  (Asymptomatic/Tier 2 Patients Labs)  Once,   STAT    Question Answer Comment  Is this test for diagnosis or screening Screening   Symptomatic for COVID-19 as defined by CDC No   Hospitalized for COVID-19 No   Admitted to ICU for COVID-19 No   Previously tested for COVID-19 Yes   Resident in a congregate (group) care setting No   Employed in healthcare setting No   Pregnant No      04/22/19 2027   04/22/19 2024  Urine culture  Add-on,   AD     04/22/19 2023          Vitals/Pain Today's Vitals   04/22/19 1522 04/22/19 1523 04/22/19 1541 04/23/19 0037  BP:   137/66 134/70  Pulse:   76 74  Resp:   18 20  Temp:   98.3 F (36.8 C)   TempSrc:   Oral   SpO2:   96% 97%  Weight:  77.1 kg    Height:  5\' 4"  (1.626 m)    PainSc: 0-No pain       Isolation Precautions No active isolations  Medications Medications  HYDROcodone-acetaminophen (NORCO/VICODIN) 5-325 MG per tablet 1 tablet (has no administration in time range)  traMADol (ULTRAM) tablet 50 mg (has no administration in time range)  atorvastatin (LIPITOR) tablet 40 mg (40 mg Oral Given 04/22/19 2245)  lisinopril (ZESTRIL) tablet 10 mg (has no administration in time range)  escitalopram (LEXAPRO) tablet 10 mg (has no administration in time range)  levothyroxine (SYNTHROID) tablet 25 mcg (has no administration in time range)  pantoprazole (PROTONIX) EC tablet 40 mg (has no administration in time range)  conjugated estrogens (PREMARIN) vaginal cream 1 Applicatorful (has no administration in time range)  gabapentin (NEURONTIN) capsule 100  mg (100 mg Oral Given 04/22/19 2332)  multivitamin with minerals tablet 1 tablet (has no administration in time range)  sodium chloride flush (NS) 0.9 % injection 3 mL ( Intravenous Canceled Entry 04/22/19 2145)  enoxaparin (LOVENOX) injection 40 mg (40 mg Subcutaneous Given 04/22/19 2246)  0.9 %  sodium chloride infusion ( Intravenous New Bag/Given 04/22/19 2249)  acetaminophen (TYLENOL) tablet 650 mg (has no administration in time range)    Or  acetaminophen (TYLENOL) suppository 650 mg (has no administration in time range)  traZODone (DESYREL) tablet 25 mg (has no administration in time range)  magnesium hydroxide (MILK OF MAGNESIA) suspension 30 mL (has no administration in time range)  ondansetron (ZOFRAN) tablet 4 mg (has no administration in time range)    Or  ondansetron (ZOFRAN) injection 4 mg (has no administration in time range)  alum & mag hydroxide-simeth (MAALOX/MYLANTA) 200-200-20 MG/5ML suspension 30 mL (has no administration in time range)  cefTRIAXone (ROCEPHIN) 1 g in sodium chloride 0.9 % 100 mL IVPB (has no administration in time range)  cefTRIAXone (ROCEPHIN) 1 g in sodium chloride 0.9 % 100 mL IVPB (0 g Intravenous Stopped 04/22/19 2239)  ibuprofen (ADVIL) tablet 400 mg (400 mg Oral Given 04/22/19 2245)    Mobility manual wheelchair Low fall risk   Focused Assessments    R Recommendations: See Admitting Provider Note  Report given to:

## 2019-04-23 NOTE — Progress Notes (Signed)
PROGRESS NOTE    Elizabeth Shepard  FAO:[id number] DOB: [birth year] DOA: 04/22/2019  PCP: Dion Body, MD    LOS - 0   Brief Narrative:  78 y.o. female with a known history of hypertension, hypothyroidism, depression and dyslipidemia, presented to the emergency room with acute onset of generalized weakness and presyncope with subsequent fall and had a seizure without loss of consciousness or injuries.  She admitted to urinary frequency and urgency without significant dysuria for about a week.  In the ED, UA positive for UTI.  She was treated with Rocephin and admitted for further evaluation of near-syncope and management of UTI.   Subjective 11/6: Patient awake sitting up in bed when seen and examined this morning.  She reports feeling well.  Denies any further dizziness or lightheadedness.  No fevers/chills, N/V/D or other acute complaints.  States she wants to know why she gets recurrent UTI's and demands studies be done while in the hospital to "get to the bottom of this".  Advised patient these are typically outpatient studies done in follow up with PCP referral to urogynecology.  Patient stated "I'm not good at follow up so need to figure it out while I'm here".  Continued to educate patient on the purpose of inpatient admission for acute problems and outpatient follow-up for non-life-threatening problems.    Assessment & Plan:   Principal Problem:   UTI (urinary tract infection) Active Problems:   Near syncope   Clinical depression   HLD (hyperlipidemia)   Essential hypertension   Hypothyroidism   GERD without esophagitis   Near Syncope - suspect secondary to UTI with relative dehydration vs orthostatic hypotension.  Differential also includes cardiogenic or arrhythmia, vasovagal or unlikely hypoglycemia. - echo and carotid dopplers pending - orthostatic vitals - monitor on telemetry - gentle IV fluids  Acute UTI - patient with hx of recurrent UTI's.  UA on admission with  large LE, positive nitrite, many bacteria and >50 wbc's.  - continue IV Rocephin pending culture - follow up urine culture - recommend PCP refer to urology or uro-gynecologist for evaluation into recurrent UTI's  Hypothyroidism - stable. Continue home Synthroid  Essential Hypertension - chronic, stable.  Continue home lisinopril  Peripheral Neuropathy - chronic.  Continue home gabapentin  Depression - chronic, stable.  Continue home Lexapro  Hyperlipidemia - chronic. Continue home Lipitor    DVT prophylaxis: Lovenox   Code Status: Full Code  Family Communication: none at bedside Disposition Plan:  Expect d/c home tomorrow, pending urine culture and sensitivity   Consultants:   none  Procedures:   Echo & Carotids pending  Antimicrobials:   Rocephin pending urine C/S, started 11/5   Objective: Vitals:   04/23/19 1006 04/23/19 1008 04/23/19 1632 04/23/19 1800  BP: 130/79 135/75 129/64   Pulse: 93 100 (!) 138 90  Resp: 20 20 16    Temp:   98.5 F (36.9 C)   TempSrc:   Oral   SpO2: 97% 98% 94%   Weight:      Height:        Intake/Output Summary (Last 24 hours) at 04/23/2019 2005 Last data filed at 04/23/2019 0400 Gross per 24 hour  Intake 743.17 ml  Output -  Net 743.17 ml   Filed Weights   04/22/19 1523  Weight: 77.1 kg    Examination:  General exam: awake, alert, no acute distress, anxious HEENT: atraumatic, clear conjunctiva, anicteric sclera, moist mucus membranes, hearing grossly normal  Respiratory system: clear to auscultation bilaterally,  no wheezes, rales or rhonchi, normal respiratory effort. Cardiovascular system: normal S1/S2, RRR, no JVD, murmurs, rubs, gallops, mild nonpitting pedal edema.   Gastrointestinal system: soft, non-tender, non-distended abdomen, no organomegaly or masses felt, normal bowel sounds. Central nervous system: alert and oriented x4. no gross focal neurologic deficits, normal speech Extremities: moves all, no  cyanosis, normal tone Skin: dry, intact, normal temperature Psychiatry: normal mood, congruent affect, judgement and insight appear normal    Data Reviewed: I have personally reviewed following labs and imaging studies  CBC: Recent Labs  Lab 04/22/19 1528 04/23/19 0223  WBC 10.7* 7.8  HGB 12.5 12.5  HCT 38.8 38.3  MCV 97.2 95.5  PLT 327 332   Basic Metabolic Panel: Recent Labs  Lab 04/22/19 1528 04/23/19 0223  NA 137 140  K 3.8 3.8  CL 101 103  CO2 26 27  GLUCOSE 93 167*  BUN 21 18  CREATININE 0.95 0.83  CALCIUM 9.1 8.9   GFR: Estimated Creatinine Clearance: 58 mL/min (by C-G formula based on SCr of 0.83 mg/dL). Liver Function Tests: No results for input(s): AST, ALT, ALKPHOS, BILITOT, PROT, ALBUMIN in the last 168 hours. No results for input(s): LIPASE, AMYLASE in the last 168 hours. No results for input(s): AMMONIA in the last 168 hours. Coagulation Profile: No results for input(s): INR, PROTIME in the last 168 hours. Cardiac Enzymes: No results for input(s): CKTOTAL, CKMB, CKMBINDEX, TROPONINI in the last 168 hours. BNP (last 3 results) No results for input(s): PROBNP in the last 8760 hours. HbA1C: No results for input(s): HGBA1C in the last 72 hours. CBG: No results for input(s): GLUCAP in the last 168 hours. Lipid Profile: No results for input(s): CHOL, HDL, LDLCALC, TRIG, CHOLHDL, LDLDIRECT in the last 72 hours. Thyroid Function Tests: No results for input(s): TSH, T4TOTAL, FREET4, T3FREE, THYROIDAB in the last 72 hours. Anemia Panel: No results for input(s): VITAMINB12, FOLATE, FERRITIN, TIBC, IRON, RETICCTPCT in the last 72 hours. Sepsis Labs: No results for input(s): PROCALCITON, LATICACIDVEN in the last 168 hours.  Recent Results (from the past 240 hour(s))  SARS CORONAVIRUS 2 (TAT 6-24 HRS) Nasopharyngeal Nasopharyngeal Swab     Status: None   Collection Time: 04/22/19  8:46 PM   Specimen: Nasopharyngeal Swab  Result Value Ref Range Status    SARS Coronavirus 2 NEGATIVE NEGATIVE Final    Comment: (NOTE) SARS-CoV-2 target nucleic acids are NOT DETECTED. The SARS-CoV-2 RNA is generally detectable in upper and lower respiratory specimens during the acute phase of infection. Negative results do not preclude SARS-CoV-2 infection, do not rule out co-infections with other pathogens, and should not be used as the sole basis for treatment or other patient management decisions. Negative results must be combined with clinical observations, patient history, and epidemiological information. The expected result is Negative. Fact Sheet for Patients: HairSlick.no Fact Sheet for Healthcare Providers: quierodirigir.com This test is not yet approved or cleared by the [place] FDA and  has been authorized for detection and/or diagnosis of SARS-CoV-2 by FDA under an Emergency Use Authorization (EUA). This EUA will remain  in effect (meaning this test can be used) for the duration of the COVID-19 declaration under Section 56 4(b)(1) of the Act, 21 U.S.C. section 360bbb-3(b)(1), unless the authorization is terminated or revoked sooner. Performed at [facility], [address], [place], [place] [address]          Radiology Studies: Dg Chest 2 View  Result Date: 04/22/2019 CLINICAL DATA:  Became lightheaded walking around  grocery store, dizziness, near syncopal, irregular EKG with runs of PVCs EXAM: CHEST - 2 VIEW COMPARISON:  09/11/2017 FINDINGS: Normal heart size and pulmonary vascularity. Moderate-sized hiatal hernia. Atherosclerotic calcification aorta. Bronchitic changes with RIGHT basilar atelectasis. Remaining lungs clear. No pleural effusion or pneumothorax. Bones demineralized with LEFT glenohumeral degenerative changes seen. IMPRESSION: Bronchitic changes with RIGHT basilar atelectasis. Hiatal hernia. Electronically Signed   By: Ulyses SouthwardMark  Boles M.D.   On: 04/22/2019 16:50    [place] Carotid Bilateral  Result Date: 04/23/2019 CLINICAL DATA:  Near syncope.  Hypertension, hyperlipidemia. EXAM: BILATERAL CAROTID DUPLEX ULTRASOUND TECHNIQUE: Wallace CullensGray scale imaging, color Doppler and duplex ultrasound were performed of bilateral carotid and vertebral arteries in the neck. COMPARISON:  None. FINDINGS: Criteria: Quantification of carotid stenosis is based on velocity parameters that correlate the residual internal carotid diameter with NASCET-based stenosis levels, using the diameter of the distal internal carotid lumen as the denominator for stenosis measurement. The following velocity measurements were obtained: RIGHT ICA: 116/23 cm/sec CCA: 116/31 cm/sec SYSTOLIC ICA/CCA RATIO:  1.0 ECA: 135 cm/sec LEFT ICA: 113/46 cm/sec CCA: 123/21 cm/sec SYSTOLIC ICA/CCA RATIO:  0.9 ECA: 118 cm/sec RIGHT CAROTID ARTERY: Partially plaque in the carotid bulb extending into proximal ICA and ECA. Some areas of distal acoustic shadowing. Normal waveforms and color Doppler signal in visualized segments. No definite high-grade stenosis. RIGHT VERTEBRAL ARTERY:  Normal flow direction and waveform. LEFT CAROTID ARTERY: Calcified plaque at the carotid bifurcation and ICA origin. No high-grade stenosis. Normal waveforms and color Doppler signal. LEFT VERTEBRAL ARTERY:  Normal flow direction and waveform. IMPRESSION: 1. Bilateral carotid bifurcation plaque resulting in less than 50% diameter ICA stenosis. 2. Antegrade bilateral vertebral arterial flow. Electronically Signed   By: Corlis Leak  Hassell M.D.   On: 04/23/2019 09:57        Scheduled Meds: . atorvastatin  40 mg Oral Daily  . [START ON 04/26/2019] conjugated estrogens  1 Applicatorful Vaginal 2 times weekly  . enoxaparin (LOVENOX) injection  40 mg Subcutaneous Q24H  . escitalopram  10 mg Oral Daily  . gabapentin  100 mg Oral BID  . levothyroxine  25 mcg Oral QAC breakfast  . lisinopril  10 mg Oral Daily  . multivitamin with minerals  1 tablet Oral Daily   . pantoprazole  40 mg Oral Daily  . sodium chloride flush  3 mL Intravenous Q12H   Continuous Infusions: . sodium chloride 100 mL/hr at 04/23/19 1944  . cefTRIAXone (ROCEPHIN)  IV       LOS: 0 days    Time spent: 30-35 min    Pennie BanterKelly A Cleave Ternes, DO [facility] Pager: [phone]  If 7PM-7AM, please contact night-coverage [email or web address] Password [email or web address] 04/23/2019, 8:05 PM

## 2019-04-24 LAB — CBC WITH DIFFERENTIAL/PLATELET
Abs Immature Granulocytes: 0.02 10*3/uL (ref 0.00–0.07)
Basophils Absolute: 0 10*3/uL (ref 0.0–0.1)
Basophils Relative: 0 %
Eosinophils Absolute: 0.1 10*3/uL (ref 0.0–0.5)
Eosinophils Relative: 1 %
HCT: 33.1 % — ABNORMAL LOW (ref 36.0–46.0)
Hemoglobin: 10.8 g/dL — ABNORMAL LOW (ref 12.0–15.0)
Immature Granulocytes: 0 %
Lymphocytes Relative: 27 %
Lymphs Abs: 1.9 10*3/uL (ref 0.7–4.0)
MCH: 30.7 pg (ref 26.0–34.0)
MCHC: 32.6 g/dL (ref 30.0–36.0)
MCV: 94 fL (ref 80.0–100.0)
Monocytes Absolute: 0.6 10*3/uL (ref 0.1–1.0)
Monocytes Relative: 8 %
Neutro Abs: 4.3 10*3/uL (ref 1.7–7.7)
Neutrophils Relative %: 64 %
Platelets: 300 10*3/uL (ref 150–400)
RBC: 3.52 MIL/uL — ABNORMAL LOW (ref 3.87–5.11)
RDW: 12.4 % (ref 11.5–15.5)
WBC: 6.9 10*3/uL (ref 4.0–10.5)
nRBC: 0 % (ref 0.0–0.2)

## 2019-04-24 LAB — BASIC METABOLIC PANEL
Anion gap: 9 (ref 5–15)
BUN: 15 mg/dL (ref 8–23)
CO2: 23 mmol/L (ref 22–32)
Calcium: 8.5 mg/dL — ABNORMAL LOW (ref 8.9–10.3)
Chloride: 106 mmol/L (ref 98–111)
Creatinine, Ser: 0.81 mg/dL (ref 0.44–1.00)
GFR calc Af Amer: >60 mL/min (ref >60)
GFR calc non Af Amer: >60 mL/min (ref >60)
Glucose, Bld: 114 mg/dL — ABNORMAL HIGH (ref 70–99)
Potassium: 4 mmol/L (ref 3.5–5.1)
Sodium: 138 mmol/L (ref 135–145)

## 2019-04-24 LAB — GLUCOSE, CAPILLARY
Glucose-Capillary: 121 mg/dL — ABNORMAL HIGH (ref 70–99)
Glucose-Capillary: 107 mg/dL — ABNORMAL HIGH (ref 70–99)

## 2019-04-24 MED ORDER — PHENAZOPYRIDINE HCL 200 MG PO TABS
200.0000 mg | ORAL_TABLET | Freq: Three times a day (TID) | ORAL | Status: DC
  Administered 2019-04-24 – 2019-04-25 (×4): 200 mg via ORAL
  Filled 2019-04-24 (×5): qty 1

## 2019-04-24 MED ORDER — TRAMADOL HCL 50 MG PO TABS
50.0000 mg | ORAL_TABLET | Freq: Four times a day (QID) | ORAL | Status: DC | PRN
  Administered 2019-04-24 – 2019-04-25 (×3): 50 mg via ORAL
  Filled 2019-04-24 (×3): qty 1

## 2019-04-24 MED ORDER — POLYETHYLENE GLYCOL 3350 17 G PO PACK
17.0000 g | PACK | Freq: Every day | ORAL | Status: DC
  Administered 2019-04-24: 17 g via ORAL
  Filled 2019-04-24 (×2): qty 1

## 2019-04-24 MED ORDER — SODIUM CHLORIDE 0.9% FLUSH: 3.0000 mL | INTRAVENOUS | Status: DC | PRN

## 2019-04-24 MED ORDER — BISACODYL 5 MG PO TBEC: 5.0000 mg | DELAYED_RELEASE_TABLET | Freq: Every day | ORAL | Status: DC | PRN

## 2019-04-24 MED ORDER — SODIUM CHLORIDE 0.9% FLUSH
3.0000 mL | Freq: Two times a day (BID) | INTRAVENOUS | Status: DC
  Administered 2019-04-24 – 2019-04-25 (×3): 3 mL via INTRAVENOUS

## 2019-04-24 MED ORDER — HYDROCODONE-ACETAMINOPHEN 5-325 MG PO TABS: 1.0000 | ORAL_TABLET | Freq: Four times a day (QID) | ORAL | Status: DC | PRN

## 2019-04-24 NOTE — Progress Notes (Signed)
PROGRESS NOTE    Elizabeth Shepard  MWN:[id number] DOB: 06/26/1940 DOA: 04/22/2019  PCP: Marisue IvanLinthavong, Kanhka, MD    LOS - 1   Brief Narrative:  78 y.o.femalewith a known history of hypertension, hypothyroidism, depression and dyslipidemia, presented to the emergency room with acute onset of generalized weakness and presyncope with subsequent fall and had a seizure without loss of consciousness or injuries. She admitted to urinary frequency and urgency without significant dysuria for about a week. In the ED, UA positive for UTI.  She was treated with Rocephin and admitted for further evaluation of near-syncope and management of UTI.  Subjective 11/7: Patient seen awake sitting up in bed.  Reports feeling groggy, she thinks from pain medication.  States her legs are hurting.  Takes gabapentin for neuropathy, but leg pain worse today.  Reports ongoing dysuria.  Constipated.  No other acute complaints.  Assessment & Plan:   Principal Problem:   UTI (urinary tract infection) Active Problems:   Near syncope   Clinical depression   HLD (hyperlipidemia)   Essential hypertension   Hypothyroidism   GERD without esophagitis   Assessment & Plan:   Principal Problem:   UTI (urinary tract infection) Active Problems:   Near syncope   Clinical depression   HLD (hyperlipidemia)   Essential hypertension   Hypothyroidism   GERD without esophagitis   Near Syncope - suspect secondary to UTI with relative dehydration.  Orthostatics negative.  Differential also includes cardiogenic or arrhythmia, vasovagal or unlikely hypoglycemia.  Echo showed EF 60-65% with grade 1 diastolic dysfunction.  Carotid dopplers showed bilateral carotid bifurcation plaque with < 50% stenosis.  No events on telemetry.  - monitor on telemetry - stopped IV fluids - PT/OT eval  Acute UTI - patient with hx of recurrent UTI's.  UA on admission with large LE, positive nitrite, many bacteria and >50 wbc's. Urine culture  with E. Coli, sensitivities pending.   - continue IV Rocephin pending culture - follow up urine culture sensitivities - added Pyridium for dysuria - recommend PCP refer to urology or uro-gynecologist for evaluation into recurrent UTI's  Hypothyroidism - stable. Continue home Synthroid  Essential Hypertension - chronic, stable.  Continue home lisinopril  Peripheral Neuropathy - chronic.  Continue home gabapentin  Depression - chronic, stable.  Continue home Lexapro  Hyperlipidemia - chronic. Continue home Lipitor  Constipation - increased bowel regimen - added Miralax and Dulcolax.  Expect improvement with ambulation.  Out of bed today.  Work with PT.    DVT prophylaxis: Lovenox   Code Status: Full Code  Family Communication: none at bedside Disposition Plan:  Expect d/c home 11/8, pending urine culture and sensitivity   Consultants:   none  Procedures:   Echo & Carotids pending  Antimicrobials:   Rocephin pending urine C/S, started 11/5   Objective: Vitals:   04/23/19 1800 04/23/19 2319 04/24/19 0500 04/24/19 0803  BP:  (!) 117/55  132/63  Pulse: 90 78  69  Resp:  19  17  Temp:  98 F (36.7 C)  98.2 F (36.8 C)  TempSrc:  Oral  Oral  SpO2:  97%  92%  Weight:   82.2 kg   Height:        Intake/Output Summary (Last 24 hours) at 04/24/2019 1220 Last data filed at 04/24/2019 0700 Gross per 24 hour  Intake 2992.11 ml  Output -  Net 2992.11 ml   Filed Weights   04/22/19 1523 04/24/19 0500  Weight: 77.1 kg 82.2 kg  Examination:  General exam: awake, alert, no acute distress Respiratory system: clear to auscultation bilaterally, no wheezes, rales or rhonchi, normal respiratory effort. Cardiovascular system: normal S1/S2, RRR, no JVD, murmurs, rubs, gallops.   Gastrointestinal system: soft, non-tender, non-distended abdomen, normal bowel sounds. Extremities: moves all, no edema, normal tone Skin: dry, intact, normal temperature Psychiatry:  normal mood, congruent affect, judgement and insight appear normal    Data Reviewed: I have personally reviewed following labs and imaging studies  CBC: Recent Labs  Lab 04/22/19 1528 04/23/19 0223 04/24/19 0611  WBC 10.7* 7.8 6.9  NEUTROABS  --   --  4.3  HGB 12.5 12.5 10.8*  HCT 38.8 38.3 33.1*  MCV 97.2 95.5 94.0  PLT 327 332 300   Basic Metabolic Panel: Recent Labs  Lab 04/22/19 1528 04/23/19 0223 04/24/19 0611  NA 137 140 138  K 3.8 3.8 4.0  CL 101 103 106  CO2 GLUCOSE 93 167* 114*  BUN CREATININE 0.95 0.83 0.81  CALCIUM 9.1 8.9 8.5*   GFR: Estimated Creatinine Clearance: 61.3 mL/min (by C-G formula based on SCr of 0.81 mg/dL). Liver Function Tests: No results for input(s): AST, ALT, ALKPHOS, BILITOT, PROT, ALBUMIN in the last 168 hours. No results for input(s): LIPASE, AMYLASE in the last 168 hours. No results for input(s): AMMONIA in the last 168 hours. Coagulation Profile: No results for input(s): INR, PROTIME in the last 168 hours. Cardiac Enzymes: No results for input(s): CKTOTAL, CKMB, CKMBINDEX, TROPONINI in the last 168 hours. BNP (last 3 results) No results for input(s): PROBNP in the last 8760 hours. HbA1C: No results for input(s): HGBA1C in the last 72 hours. CBG: Recent Labs  Lab 04/24/19 0413 04/24/19 1145  GLUCAP 121* 107*   Lipid Profile: No results for input(s): CHOL, HDL, LDLCALC, TRIG, CHOLHDL, LDLDIRECT in the last 72 hours. Thyroid Function Tests: No results for input(s): TSH, T4TOTAL, FREET4, T3FREE, THYROIDAB in the last 72 hours. Anemia Panel: No results for input(s): VITAMINB12, FOLATE, FERRITIN, TIBC, IRON, RETICCTPCT in the last 72 hours. Sepsis Labs: No results for input(s): PROCALCITON, LATICACIDVEN in the last 168 hours.  Recent Results (from the past 240 hour(s))  Urine culture     Status: Abnormal (Preliminary result)   Collection Time: 04/22/19  4:04 PM   Specimen: Urine, Random  Result Value  Ref Range Status   Specimen Description   Final    URINE, RANDOM Performed at [facility], [address], [place], [place] [address]    Special Requests   Final    NONE Performed at [facility], [address] [place], [place] [address]    Culture (A)  Final    >=100,000 COLONIES/mL ESCHERICHIA COLI SUSCEPTIBILITIES TO FOLLOW Performed at [facility], [address], [place], [place] [address]    Report Status PENDING  Incomplete  SARS CORONAVIRUS 2 (TAT 6-24 HRS) Nasopharyngeal Nasopharyngeal Swab     Status: None   Collection Time: 04/22/19  8:46 PM   Specimen: Nasopharyngeal Swab  Result Value Ref Range Status   SARS Coronavirus 2 NEGATIVE NEGATIVE Final    Comment: (NOTE) SARS-CoV-2 target nucleic acids are NOT DETECTED. The SARS-CoV-2 RNA is generally detectable in upper and lower respiratory specimens during the acute phase of infection. Negative results do not preclude SARS-CoV-2 infection, do not rule out co-infections with other pathogens, and should not be used as the sole basis for treatment or other patient management decisions. Negative  results must be combined with clinical observations, patient history, and epidemiological information. The expected result is Negative. Fact Sheet for Patients: SugarRoll.be Fact Sheet for Healthcare Providers: https://www.woods-mathews.com/ This test is not yet approved or cleared by the [place] FDA and  has been authorized for detection and/or diagnosis of SARS-CoV-2 by FDA under an Emergency Use Authorization (EUA). This EUA will remain  in effect (meaning this test can be used) for the duration of the COVID-19 declaration under Section 56 4(b)(1) of the Act, 21 U.S.C. section 360bbb-3(b)(1), unless the authorization is terminated or revoked sooner. Performed at [facility], [address], [place], [place] [address]          Radiology  Studies: Dg Chest 2 View  Result Date: 04/22/2019 CLINICAL DATA:  Became lightheaded walking around grocery store, dizziness, near syncopal, irregular EKG with runs of PVCs EXAM: CHEST - 2 VIEW COMPARISON:  09/11/2017 FINDINGS: Normal heart size and pulmonary vascularity. Moderate-sized hiatal hernia. Atherosclerotic calcification aorta. Bronchitic changes with RIGHT basilar atelectasis. Remaining lungs clear. No pleural effusion or pneumothorax. Bones demineralized with LEFT glenohumeral degenerative changes seen. IMPRESSION: Bronchitic changes with RIGHT basilar atelectasis. Hiatal hernia. Electronically Signed   By: Lavonia Dana M.D.   On: 04/22/2019 16:50   [place] Carotid Bilateral  Result Date: 04/23/2019 CLINICAL DATA:  Near syncope.  Hypertension, hyperlipidemia. EXAM: BILATERAL CAROTID DUPLEX ULTRASOUND TECHNIQUE: Pearline Cables scale imaging, color Doppler and duplex ultrasound were performed of bilateral carotid and vertebral arteries in the neck. COMPARISON:  None. FINDINGS: Criteria: Quantification of carotid stenosis is based on velocity parameters that correlate the residual internal carotid diameter with NASCET-based stenosis levels, using the diameter of the distal internal carotid lumen as the denominator for stenosis measurement. The following velocity measurements were obtained: RIGHT ICA: 116/23 cm/sec CCA: 093/81 cm/sec SYSTOLIC ICA/CCA RATIO:  1.0 ECA: 135 cm/sec LEFT ICA: 113/46 cm/sec CCA: 829/93 cm/sec SYSTOLIC ICA/CCA RATIO:  0.9 ECA: 118 cm/sec RIGHT CAROTID ARTERY: Partially plaque in the carotid bulb extending into proximal ICA and ECA. Some areas of distal acoustic shadowing. Normal waveforms and color Doppler signal in visualized segments. No definite high-grade stenosis. RIGHT VERTEBRAL ARTERY:  Normal flow direction and waveform. LEFT CAROTID ARTERY: Calcified plaque at the carotid bifurcation and ICA origin. No high-grade stenosis. Normal waveforms and color Doppler signal. LEFT VERTEBRAL  ARTERY:  Normal flow direction and waveform. IMPRESSION: 1. Bilateral carotid bifurcation plaque resulting in less than 50% diameter ICA stenosis. 2. Antegrade bilateral vertebral arterial flow. Electronically Signed   By: Lucrezia Europe M.D.   On: 04/23/2019 09:57        Scheduled Meds: . atorvastatin  40 mg Oral Daily  . [START ON 04/26/2019] conjugated estrogens  1 Applicatorful Vaginal 2 times weekly  . enoxaparin (LOVENOX) injection  40 mg Subcutaneous Q24H  . escitalopram  10 mg Oral Daily  . gabapentin  100 mg Oral BID  . levothyroxine  25 mcg Oral QAC breakfast  . lisinopril  10 mg Oral Daily  . multivitamin with minerals  1 tablet Oral Daily  . pantoprazole  40 mg Oral Daily  . phenazopyridine  200 mg Oral TID WC  . polyethylene glycol  17 g Oral Daily  . sodium chloride flush  3 mL Intravenous Q12H   Continuous Infusions: . cefTRIAXone (ROCEPHIN)  IV Stopped (04/23/19 2224)     LOS: 1 day    Time spent: 25-30 minutes    Ezekiel Slocumb, DO [facility] Pager: [phone]  If  7PM-7AM, please contact night-coverage [email or web address] Password [email or web address] 04/24/2019, 12:20 PM

## 2019-04-24 NOTE — Progress Notes (Signed)
Physical Therapy Evaluation Patient Details Name: Elizabeth Shepard MRN: [id number] DOB: 1940/12/19 Today's Date: 04/24/2019   History of Present Illness  Elizabeth Shepard  is a 78 y.o. Caucasian female with a known history of hypertension, hypothyroidism, depression and dyslipidemia, presented to the emergency room with acute onset of generalized weakness and presyncope with subsequent fall and had a seizure without loss of consciousness or injuries.   Clinical Impression  Patient performs bed mobility with CGA, transfers with Rw and min assist. She ambulates into the bathroom and uses the commode with sBA. She ambulates with RW 300 feet with CGA with decreased gait speed and step-to gait pattern. Patient has WFL BLE strength. Patient has good static standing balance and fair dynamic standing balance. Patient will continue to benefit from skilled PT to improve mobility and gait.     Follow Up Recommendations Home health PT    Equipment Recommendations  None recommended by PT    Recommendations for Other Services       Precautions / Restrictions Restrictions Weight Bearing Restrictions: No      Mobility  Bed Mobility Overal bed mobility: Modified Independent                Transfers Overall transfer level: Modified independent Equipment used: Rolling walker (2 wheeled)             General transfer comment: vC for safety  Ambulation/Gait Ambulation/Gait assistance: Modified independent (Device/Increase time) Gait Distance (Feet): 300 Feet Assistive device: Rolling walker (2 wheeled) Gait Pattern/deviations: Step-to pattern Gait velocity: (decreased)      Stairs            Wheelchair Mobility    Modified Rankin (Stroke Patients Only)       Balance Overall balance assessment: Modified Independent                                           Pertinent Vitals/Pain      Home Living Family/patient expects to be discharged to:: Private  residence Living Arrangements: Spouse/significant other Available Help at Discharge: Family Type of Home: House Home Access: Stairs to enter Entrance Stairs-Rails: Right;Left;Can reach both Secretary/administrator of Steps: 3 Home Layout: One level        Prior Function Level of Independence: Independent with assistive device(s)               Hand Dominance        Extremity/Trunk Assessment   Upper Extremity Assessment Upper Extremity Assessment: Overall WFL for tasks assessed    Lower Extremity Assessment Lower Extremity Assessment: Overall WFL for tasks assessed       Communication   Communication: No difficulties  Cognition Arousal/Alertness: Awake/alert Behavior During Therapy: WFL for tasks assessed/performed Overall Cognitive Status: Within Functional Limits for tasks assessed                                        General Comments      Exercises     Assessment/Plan    PT Assessment Patient needs continued PT services  PT Problem List Decreased strength;Decreased activity tolerance;Decreased mobility       PT Treatment Interventions Gait training;Stair training;Therapeutic activities;Therapeutic exercise    PT Goals (Current goals can be found in the Care Plan section)  Acute  Rehab PT Goals Patient Stated Goal: to go home PT Goal Formulation: With patient Time For Goal Achievement: 05/08/19 Potential to Achieve Goals: Good    Frequency Min 2X/week   Barriers to discharge        Co-evaluation               AM-PAC PT "6 Clicks" Mobility  Outcome Measure Help needed turning from your back to your side while in a flat bed without using bedrails?: None Help needed moving from lying on your back to sitting on the side of a flat bed without using bedrails?: None Help needed moving to and from a bed to a chair (including a wheelchair)?: A Little Help needed standing up from a chair using your arms (e.g., wheelchair or  bedside chair)?: A Little Help needed to walk in hospital room?: A Little Help needed climbing 3-5 steps with a railing? : A Little 6 Click Score: 20    End of Session Equipment Utilized During Treatment: Gait belt Activity Tolerance: Patient tolerated treatment well Patient left: in bed;with bed alarm set Nurse Communication: Mobility status PT Visit Diagnosis: Muscle weakness (generalized) (M62.81);Difficulty in walking, not elsewhere classified (R26.2)    Time: 0093-8182 PT Time Calculation (min) (ACUTE ONLY): 25 min   Charges:   PT Evaluation $PT Eval Low Complexity: 1 Low PT Treatments $Gait Training: 8-22 mins          Alanson Puls, PT DPT 04/24/2019, 12:29 PM

## 2019-04-24 NOTE — Progress Notes (Signed)
Ambulates to BR; pt reports urinary frequency with IVF's discontinued with noted improvement. Laxatives effective for constipation. Pt asks numerous/multiple questions regarding regarding meds. Tramadol prn for chronic leg pain effective.

## 2019-04-25 DIAGNOSIS — E785 Hyperlipidemia, unspecified: Secondary | ICD-10-CM

## 2019-04-25 DIAGNOSIS — K219 Gastro-esophageal reflux disease without esophagitis: Secondary | ICD-10-CM

## 2019-04-25 LAB — URINE CULTURE: Culture: >=100000 — AB

## 2019-04-25 MED ORDER — POLYETHYLENE GLYCOL 3350 17 G PO PACK: 17.0000 g | PACK | Freq: Every day | ORAL | 0 refills | Status: AC | PRN

## 2019-04-25 MED ORDER — PHENAZOPYRIDINE HCL 200 MG PO TABS: 200.0000 mg | ORAL_TABLET | Freq: Three times a day (TID) | ORAL | 0 refills | Status: AC

## 2019-04-25 MED ORDER — CEPHALEXIN 500 MG PO CAPS
500.0000 mg | ORAL_CAPSULE | Freq: Two times a day (BID) | ORAL | Status: DC
  Administered 2019-04-25: 500 mg via ORAL
  Filled 2019-04-25: qty 1

## 2019-04-25 MED ORDER — CEPHALEXIN 500 MG PO CAPS: 500.0000 mg | ORAL_CAPSULE | Freq: Two times a day (BID) | ORAL | 0 refills | Status: AC

## 2019-04-25 NOTE — Progress Notes (Signed)
Oral and written AVS instructions given with pt verbalizing and repeating back understanding. Confirmed with pt to pick up keflex and other rx at her local [facility]. Confirmed with case mgt that [facility] referral in place with pt advised. Discharge home to self/family care with [facility] services.

## 2019-04-25 NOTE — Discharge Summary (Signed)
Physician Discharge Summary  Elizabeth Shepard ZOX:[id number] DOB: Oct 18, 1940 DOA: 04/22/2019  PCP: Marisue Ivan, MD  Admit date: 04/22/2019 Discharge date: 04/25/2019  Admitted From: Home  Disposition: Home   Recommendations for Outpatient Follow-up:  1. Follow up with PCP in 1-2 weeks 2. Please obtain BMP/CBC in one week 3. Please request referral to urology or urogynecology evaluation of recurrent UTIs  Home Health: Yes, PT and aide Equipment/Devices: None  Discharge Condition: Stable CODE STATUS: Full  Diet recommendation: Heart Healthy   Brief/Interim Summary:  78 y.o. female with a known history of hypertension, hypothyroidism, depression and dyslipidemia, presented to the emergency room with acute onset of generalized weakness and presyncope with subsequent fall and had a seizure without loss of consciousness or injuries.  She admitted to urinary frequency and urgency without significant dysuria for about a week.  In the ED, UA positive for UTI.  She was treated with Rocephin and admitted for further evaluation of near-syncope and management of UTI.  Evaluation of her near syncopal episode included orthostatic vitals which were negative, echocardiogram which showed normal ejection fraction 66 5% with grade 1 diastolic dysfunction, carotid Dopplers that showed bilateral carotid bifurcation plaque with less than 50% stenosis, and telemetry monitoring which showed no arrhythmias.  Suspect near syncope was due to UTI and relative dehydration from urinary frequency and poor p.o. intake.  For UTI, Rocephin was continued while inpatient until culture sensitivities return.  Patient discharged with Keflex to complete therapy.  Patient repeatedly requested evaluation of her recurrent UTIs while inpatient.  Advised patient this evaluation is done in the outpatient setting and likely to require referral from her PCP.  Encourage close follow-up, patient agreed.  She is clinically improved and stable  for discharge home.  Physical therapy recommended home health PT which has been arranged.   Discharge Diagnoses: Principal Problem:   UTI (urinary tract infection) Active Problems:   Near syncope   Clinical depression   HLD (hyperlipidemia)   Essential hypertension   Hypothyroidism   GERD without esophagitis    Discharge Instructions please complete course of antibiotics as prescribed  Discharge Instructions    Call MD for:  persistant dizziness or light-headedness   Complete by: As directed    Call MD for:  temperature >100.4   Complete by: As directed    Diet - low sodium heart healthy   Complete by: As directed    Discharge instructions   Complete by: As directed    Follow up with PCP within 1 week and ask for referral to urology or uro-gynecology to evaluate reason for recurrent UTI's.   Increase activity slowly   Complete by: As directed      Allergies as of 04/25/2019      Reactions   Ciprofloxacin Itching, Rash      Medication List    TAKE these medications   atorvastatin 40 MG tablet Commonly known as: LIPITOR Take 40 mg by mouth daily. Notes to patient: Monday morning next dose   cephALEXin 500 MG capsule Commonly known as: KEFLEX Take 1 capsule (500 mg total) by mouth every 12 (twelve) hours for 3 days. Notes to patient: Next dose due tonight   conjugated estrogens vaginal cream Commonly known as: PREMARIN Place 1 Applicatorful vaginally 2 (two) times a week. Notes to patient: Regular nights   escitalopram 10 MG tablet Commonly known as: LEXAPRO Take 10 mg by mouth daily. Notes to patient: Next dose use Monday morning   gabapentin 100 MG capsule  Commonly known as: NEURONTIN Take 1 capsule (100 mg total) by mouth 2 (two) times daily. Notes to patient: Next dose due tonight   Ibuprofen 200 MG Caps Commonly known as: Advil Take 3 capsules (600 mg total) by mouth daily as needed (Moderate pain).   levothyroxine 25 MCG tablet Commonly known  as: SYNTHROID Take 25 mcg by mouth daily before breakfast. Notes to patient: Next dose due Monday morning- take with just water; no other meds/food or beverage.    lisinopril 10 MG tablet Commonly known as: ZESTRIL Take 10 mg by mouth daily. Notes to patient: Next dose Monday morning   multivitamin with minerals Tabs tablet Take 1 tablet by mouth daily. Notes to patient: Next dose Monday morning   nystatin-triamcinolone ointment Commonly known as: MYCOLOG Apply 1 application topically 2 (two) times daily. Apply to left arm pit Notes to patient: Per your schedule   pantoprazole 40 MG tablet Commonly known as: PROTONIX Take 40 mg by mouth daily. Notes to patient: Next dose due Monday morning   phenazopyridine 200 MG tablet Commonly known as: PYRIDIUM Take 1 tablet (200 mg total) by mouth 3 (three) times daily with meals for 2 days. Notes to patient: Next dose with supper today   polyethylene glycol 17 g packet Commonly known as: MIRALAX / GLYCOLAX Take 17 g by mouth daily as needed for mild constipation or moderate constipation.   traMADol 50 MG tablet Commonly known as: ULTRAM Take 1 tablet (50 mg total) by mouth every 8 (eight) hours as needed for severe pain.       Allergies  Allergen Reactions  . Ciprofloxacin Itching and Rash    Consultations:  None   Procedures/Studies: Dg Chest 2 View  Result Date: 04/22/2019 CLINICAL DATA:  Became lightheaded walking around grocery store, dizziness, near syncopal, irregular EKG with runs of PVCs EXAM: CHEST - 2 VIEW COMPARISON:  09/11/2017 FINDINGS: Normal heart size and pulmonary vascularity. Moderate-sized hiatal hernia. Atherosclerotic calcification aorta. Bronchitic changes with RIGHT basilar atelectasis. Remaining lungs clear. No pleural effusion or pneumothorax. Bones demineralized with LEFT glenohumeral degenerative changes seen. IMPRESSION: Bronchitic changes with RIGHT basilar atelectasis. Hiatal hernia.  Electronically Signed   By: Ulyses Southward M.D.   On: 04/22/2019 16:50   US Carotid Bilateral  Result Date: 04/23/2019 CLINICAL DATA:  Near syncope.  Hypertension, hyperlipidemia. EXAM: BILATERAL CAROTID DUPLEX ULTRASOUND TECHNIQUE: Wallace Cullens scale imaging, color Doppler and duplex ultrasound were performed of bilateral carotid and vertebral arteries in the neck. COMPARISON:  None. FINDINGS: Criteria: Quantification of carotid stenosis is based on velocity parameters that correlate the residual internal carotid diameter with NASCET-based stenosis levels, using the diameter of the distal internal carotid lumen as the denominator for stenosis measurement. The following velocity measurements were obtained: RIGHT ICA: 116/23 cm/sec CCA: 116/31 cm/sec SYSTOLIC ICA/CCA RATIO:  1.0 ECA: 135 cm/sec LEFT ICA: 113/46 cm/sec CCA: 123/21 cm/sec SYSTOLIC ICA/CCA RATIO:  0.9 ECA: 118 cm/sec RIGHT CAROTID ARTERY: Partially plaque in the carotid bulb extending into proximal ICA and ECA. Some areas of distal acoustic shadowing. Normal waveforms and color Doppler signal in visualized segments. No definite high-grade stenosis. RIGHT VERTEBRAL ARTERY:  Normal flow direction and waveform. LEFT CAROTID ARTERY: Calcified plaque at the carotid bifurcation and ICA origin. No high-grade stenosis. Normal waveforms and color Doppler signal. LEFT VERTEBRAL ARTERY:  Normal flow direction and waveform. IMPRESSION: 1. Bilateral carotid bifurcation plaque resulting in less than 50% diameter ICA stenosis. 2. Antegrade bilateral vertebral arterial flow. Electronically Signed   By:  Corlis Leak M.D.   On: 04/23/2019 09:57      Echocardiogram: EF 60 to 65%, grade 1 diastolic dysfunction  Carotid Dopplers showed bilateral carotid bifurcation plaque with less than 50% stenosis  Subjective: Patient seen and examined awake and sitting up in bed.  No acute events reported night.  Patient denies fevers or chills.  Reports dysuria has resolved still has  some frequency.  Patient agreeable with discharge home and close PCP follow-up with referral to urology or urogynecology for urodynamic studies and structural evaluation.   Discharge Exam: Vitals:   04/25/19 0433 04/25/19 0921  BP: 138/66 (!) 136/52  Pulse: 76 81  Resp: 18 20  Temp: 98.2 F (36.8 C) 98.6 F (37 C)  SpO2: 91% 94%   Vitals:   04/24/19 1600 04/25/19 0433 04/25/19 0700 04/25/19 0921  BP: 134/64 138/66  (!) 136/52  Pulse: 74 76  81  Resp: Temp: 98.6 F (37 C) 98.2 F (36.8 C)  98.6 F (37 C)  TempSrc: Oral Oral  Oral  SpO2: 94% 91%  94%  Weight:   82.1 kg   Height:        General: Pt is alert, awake, not in acute distress Cardiovascular: RRR, S1/S2 +, no rubs, no gallops Respiratory: CTA bilaterally, no wheezing, no rhonchi Abdominal: Soft, NT, ND, bowel sounds + Extremities: no edema, no cyanosis    The results of significant diagnostics from this hospitalization (including imaging, microbiology, ancillary and laboratory) are listed below for reference.     Microbiology: Recent Results (from the past 240 hour(s))  Urine culture     Status: Abnormal   Collection Time: 04/22/19  4:04 PM   Specimen: Urine, Random  Result Value Ref Range Status   Specimen Description   Final    URINE, RANDOM Performed at [facility], [address] [place], [place] [address]    Special Requests   Final    NONE Performed at [facility], [address] [place], [place] [address]    Culture >=100,000 COLONIES/mL ESCHERICHIA COLI (A)  Final   Report Status 04/25/2019 FINAL  Final   Organism ID, Bacteria ESCHERICHIA COLI (A)  Final      Susceptibility   Escherichia coli - MIC*    AMPICILLIN 8 SENSITIVE Sensitive     CEFAZOLIN <=4 SENSITIVE Sensitive     CEFTRIAXONE <=1 SENSITIVE Sensitive     CIPROFLOXACIN <=0.25 SENSITIVE Sensitive     GENTAMICIN <=1 SENSITIVE Sensitive     IMIPENEM <=0.25 SENSITIVE Sensitive     NITROFURANTOIN  <=16 SENSITIVE Sensitive     TRIMETH/SULFA <=20 SENSITIVE Sensitive     AMPICILLIN/SULBACTAM <=2 SENSITIVE Sensitive     PIP/TAZO <=4 SENSITIVE Sensitive     Extended ESBL NEGATIVE Sensitive     * >=100,000 COLONIES/mL ESCHERICHIA COLI  SARS CORONAVIRUS 2 (TAT 6-24 HRS) Nasopharyngeal Nasopharyngeal Swab     Status: None   Collection Time: 04/22/19  8:46 PM   Specimen: Nasopharyngeal Swab  Result Value Ref Range Status   SARS Coronavirus 2 NEGATIVE NEGATIVE Final    Comment: (NOTE) SARS-CoV-2 target nucleic acids are NOT DETECTED. The SARS-CoV-2 RNA is generally detectable in upper and lower respiratory specimens during the acute phase of infection. Negative results do not preclude SARS-CoV-2 infection, do not rule out co-infections with other pathogens, and should not be used as the sole basis for treatment or other patient management decisions. Negative results must be combined with clinical  observations, patient history, and epidemiological information. The expected result is Negative. Fact Sheet for Patients: HairSlick.nohttps://www.fda.gov/media/138098/download Fact Sheet for Healthcare Providers: quierodirigir.comhttps://www.fda.gov/media/138095/download This test is not yet approved or cleared by the [place] FDA and  has been authorized for detection and/or diagnosis of SARS-CoV-2 by FDA under an Emergency Use Authorization (EUA). This EUA will remain  in effect (meaning this test can be used) for the duration of the COVID-19 declaration under Section 56 4(b)(1) of the Act, 21 U.S.C. section 360bbb-3(b)(1), unless the authorization is terminated or revoked sooner. Performed at [facility], [facility], [address] [place], [place] [address]      Labs: BNP (last 3 results) No results for input(s): BNP in the last 8760 hours. Basic Metabolic Panel: Recent Labs  Lab 04/22/19 1528 04/23/19 0223 04/24/19 0611  NA 137 140 138  K 3.8 3.8 4.0  CL 101 103 106  CO2 26 27 23   GLUCOSE 93 167*  114*  BUN 21 18 15   CREATININE 0.95 0.83 0.81  CALCIUM 9.1 8.9 8.5*   Liver Function Tests: No results for input(s): AST, ALT, ALKPHOS, BILITOT, PROT, ALBUMIN in the last 168 hours. No results for input(s): LIPASE, AMYLASE in the last 168 hours. No results for input(s): AMMONIA in the last 168 hours. CBC: Recent Labs  Lab 04/22/19 1528 04/23/19 0223 04/24/19 0611  WBC 10.7* 7.8 6.9  NEUTROABS  --   --  4.3  HGB 12.5 12.5 10.8*  HCT 38.8 38.3 33.1*  MCV 97.2 95.5 94.0  PLT 327 332 300   Cardiac Enzymes: No results for input(s): CKTOTAL, CKMB, CKMBINDEX, TROPONINI in the last 168 hours. BNP: Invalid input(s): POCBNP CBG: Recent Labs  Lab 04/24/19 0413 04/24/19 1145  GLUCAP 121* 107*   D-Dimer No results for input(s): DDIMER in the last 72 hours. Hgb A1c No results for input(s): HGBA1C in the last 72 hours. Lipid Profile No results for input(s): CHOL, HDL, LDLCALC, TRIG, CHOLHDL, LDLDIRECT in the last 72 hours. Thyroid function studies No results for input(s): TSH, T4TOTAL, T3FREE, THYROIDAB in the last 72 hours.  Invalid input(s): FREET3 Anemia work up No results for input(s): VITAMINB12, FOLATE, FERRITIN, TIBC, IRON, RETICCTPCT in the last 72 hours. Urinalysis    Component Value Date/Time   COLORURINE YELLOW (A) 04/22/2019 1606   APPEARANCEUR CLOUDY (A) 04/22/2019 1606   APPEARANCEUR Cloudy (A) 09/20/2015 1457   LABSPEC 1.016 04/22/2019 1606   PHURINE 5.0 04/22/2019 1606   GLUCOSEU NEGATIVE 04/22/2019 1606   HGBUR NEGATIVE 04/22/2019 1606   BILIRUBINUR NEGATIVE 04/22/2019 1606   BILIRUBINUR Negative 09/20/2015 1457   KETONESUR NEGATIVE 04/22/2019 1606   PROTEINUR 30 (A) 04/22/2019 1606   UROBILINOGEN 0.2 08/03/2009 1125   NITRITE POSITIVE (A) 04/22/2019 1606   LEUKOCYTESUR LARGE (A) 04/22/2019 1606   Sepsis Labs Invalid input(s): PROCALCITONIN,  WBC,  LACTICIDVEN Microbiology Recent Results (from the past 240 hour(s))  Urine culture     Status:  Abnormal   Collection Time: 04/22/19  4:04 PM   Specimen: Urine, Random  Result Value Ref Range Status   Specimen Description   Final    URINE, RANDOM Performed at [facility], [address] [place], [place] [address]    Special Requests   Final    NONE Performed at [facility], [address] [place], [place] [address]    Culture >=100,000 COLONIES/mL ESCHERICHIA COLI (A)  Final   Report Status 04/25/2019 FINAL  Final   Organism ID, Bacteria ESCHERICHIA COLI (A)  Final      Susceptibility   Escherichia coli - MIC*    AMPICILLIN 8 SENSITIVE Sensitive     CEFAZOLIN <=4 SENSITIVE Sensitive     CEFTRIAXONE <=1 SENSITIVE Sensitive     CIPROFLOXACIN <=0.25 SENSITIVE Sensitive     GENTAMICIN <=1 SENSITIVE Sensitive     IMIPENEM <=0.25 SENSITIVE Sensitive     NITROFURANTOIN <=16 SENSITIVE Sensitive     TRIMETH/SULFA <=20 SENSITIVE Sensitive     AMPICILLIN/SULBACTAM <=2 SENSITIVE Sensitive     PIP/TAZO <=4 SENSITIVE Sensitive     Extended ESBL NEGATIVE Sensitive     * >=100,000 COLONIES/mL ESCHERICHIA COLI  SARS CORONAVIRUS 2 (TAT 6-24 HRS) Nasopharyngeal Nasopharyngeal Swab     Status: None   Collection Time: 04/22/19  8:46 PM   Specimen: Nasopharyngeal Swab  Result Value Ref Range Status   SARS Coronavirus 2 NEGATIVE NEGATIVE Final    Comment: (NOTE) SARS-CoV-2 target nucleic acids are NOT DETECTED. The SARS-CoV-2 RNA is generally detectable in upper and lower respiratory specimens during the acute phase of infection. Negative results do not preclude SARS-CoV-2 infection, do not rule out co-infections with other pathogens, and should not be used as the sole basis for treatment or other patient management decisions. Negative results must be combined with clinical observations, patient history, and epidemiological information. The expected result is Negative. Fact Sheet for Patients: SugarRoll.be Fact Sheet for Healthcare  Providers: https://www.woods-mathews.com/ This test is not yet approved or cleared by the [place] FDA and  has been authorized for detection and/or diagnosis of SARS-CoV-2 by FDA under an Emergency Use Authorization (EUA). This EUA will remain  in effect (meaning this test can be used) for the duration of the COVID-19 declaration under Section 56 4(b)(1) of the Act, 21 U.S.C. section 360bbb-3(b)(1), unless the authorization is terminated or revoked sooner. Performed at [facility], [place] [address] [place], [place] [address]      Time coordinating discharge: Over 30 minutes  SIGNED:   Ezekiel Slocumb, DO [facility] 04/25/2019, 2:17 PM Pager [phone]  If 7PM-7AM, please contact night-coverage [email or web address] Password [email or web address]

## 2019-04-25 NOTE — TOC Initial Note (Signed)
Transition of Care [facility]) - Initial/Assessment Note    Patient Details  Name: Elizabeth Shepard MRN: [id number] Date of Birth: 30-Nov-1940  Transition of Care [facility]) CM/SW Contact:    Verna Czech [place], [place] Phone Number: 04/25/2019, 11:04 AM  Clinical Narrative:   Patient  is a 78 y.o. Caucasian female with  presented to the emergency room with  generalized weakness and presyncope with subsequent fall and seizure without loss of consciousness or injuries.  Patient will return home with her husband. Patient's spouse does not drive, she will either ask her neighbor or her son that lives in [place] to to provide transport home. Patient has a RW, cane and commode chair with rails  at home. Patient declined need for any additional DME. Patient agrees with [facility] PT through [facility] and is requesting a bath aid.   Gagandeep Kossman, LCSW Clinical Social Work [phone]                   Expected Discharge Plan: Home w Home Health Services Barriers to Discharge: Transportation(spouse does not drive, neighbor will come pick patient up or son from [place], [place])   Patient Goals and CMS Choice Patient states their goals for this hospitalization and ongoing recovery are:: "to be able to walk real good" CMS Medicare.gov Compare Post Acute Care list provided to:: (patient has chosen [facility] to assist with her [facility] needs) Choice offered to / list presented to : (patient would like to keep the same agency she has used in the past [facility])  Expected Discharge Plan and Services Expected Discharge Plan: Home w Home Health Services In-house Referral: Clinical Social Work Discharge Planning Services: CM Consult Post Acute Care Choice: Home Health Living arrangements for the past 2 months: Single Family Home(1 level condo) Expected Discharge Date: 04/25/19                         HH Arranged: RN, PT, Nurse's Aide HH Agency: ([facility]) Date HH Agency  Contacted: 04/25/19 Time HH Agency Contacted: 1102 Representative spoke with at [facility]: [facility]  Prior Living Arrangements/Services Living arrangements for the past 2 months: Single Family Home(1 level condo) Lives with:: Spouse Patient language and need for interpreter reviewed:: Yes Do you feel safe going back to the place where you live?: Yes      Need for Family Participation in Patient Care: Yes (Comment) Care giver support system in place?: Yes (comment) Current home services: DME(patient has a RW, Cane and raised toilet seat with rails) Criminal Activity/Legal Involvement Pertinent to Current Situation/Hospitalization: No - Comment as needed  Activities of Daily Living Home Assistive Devices/Equipment: None ADL Screening (condition at time of admission) Patient's cognitive ability adequate to safely complete daily activities?: Yes Is the patient deaf or have difficulty hearing?: Yes Does the patient have difficulty seeing, even when wearing glasses/contacts?: No Does the patient have difficulty concentrating, remembering, or making decisions?: No Patient able to express need for assistance with ADLs?: Yes Does the patient have difficulty dressing or bathing?: No Independently performs ADLs?: Yes (appropriate for developmental age) Does the patient have difficulty walking or climbing stairs?: Yes Weakness of Legs: Both Weakness of Arms/Hands: None  Permission Sought/Granted Permission sought to share information with : Facility Industrial/product designer granted to share information with : Yes, Verbal Permission Granted  Share Information with NAME: Ed Trowbridge     Permission granted to share  info w Relationship: spouse  Permission granted to share info w Contact Information: [phone]  Emotional Assessment Appearance:: Appears older than stated age Attitude/Demeanor/Rapport: Engaged, Self-Confident Affect (typically observed): Accepting,  Adaptable Orientation: : Oriented to Self, Oriented to Place, Oriented to  Time, Oriented to Situation Alcohol / Substance Use: Not Applicable Psych Involvement: No (comment)  Admission diagnosis:  Acute cystitis without hematuria [N30.00] Near syncope [R55] Patient Active Problem List   Diagnosis Date Noted  . Near syncope 04/23/2019  . GERD without esophagitis 04/23/2019  . UTI (urinary tract infection) 04/22/2019  . Anterior dislocation of right hip ([place]) 09/11/2017  . Sepsis ([place]) 12/30/2015  . UTI (lower urinary tract infection) 12/29/2015  . Hypothyroidism 12/29/2015  . Weakness 12/29/2015  . Abnormal EKG 12/29/2015  . Allergic rhinitis 09/20/2015  . Clinical depression 09/20/2015  . HLD (hyperlipidemia) 09/20/2015  . Essential hypertension 09/20/2015  . Anemia 07/04/2015  . Acute hemorrhagic cystitis 11/07/2014   PCP:  Dion Body, MD Pharmacy:   [facility] [phone] [address], [place] [address] Phone: [phone] Fax: [phone]     Social Determinants of Health (SDOH) Interventions    Readmission Risk Interventions No flowsheet data found.

## 2019-04-25 NOTE — Discharge Instructions (Signed)
Syncope °Syncope is when you pass out (faint) for a short time. It is caused by a sudden decrease in blood flow to the brain. Signs that you may be about to pass out include: °· Feeling dizzy or light-headed. °· Feeling sick to your stomach (nauseous). °· Seeing all white or all black. °· Having cold, clammy skin. °If you pass out, get help right away. Call your local emergency services (911 in the [place] Do not drive yourself to the hospital. °Follow these instructions at home: °Watch for any changes in your symptoms. Take these actions to stay safe and help with your symptoms: °Lifestyle °· Do not drive, use machinery, or play sports until your doctor says it is okay. °· Do not drink alcohol. °· Do not use any products that contain nicotine or tobacco, such as cigarettes and e-cigarettes. If you need help quitting, ask your doctor. °· Drink enough fluid to keep your pee (urine) pale yellow. °General instructions °· Take over-the-counter and prescription medicines only as told by your doctor. °· If you are taking blood pressure or heart medicine, sit up and stand up slowly. Spend a few minutes getting ready to sit and then stand. This can help you feel less dizzy. °· Have someone stay with you until you feel stable. °· If you start to feel like you might pass out, lie down right away and raise (elevate) your feet above the level of your heart. Breathe deeply and steadily. Wait until all of the symptoms are gone. °· Keep all follow-up visits as told by your doctor. This is important. °Get help right away if: °· You have a very bad headache. °· You pass out once or more than once. °· You have pain in your chest, belly, or back. °· You have a very fast or uneven heartbeat (palpitations). °· It hurts to breathe. °· You are bleeding from your mouth or your bottom (rectum). °· You have black or tarry poop (stool). °· You have jerky movements that you cannot control (seizure). °· You are confused. °· You have trouble  walking. °· You are very weak. °· You have vision problems. °These symptoms may be an emergency. Do not wait to see if the symptoms will go away. Get medical help right away. Call your local emergency services (911 in the [place] Do not drive yourself to the hospital. °Summary °· Syncope is when you pass out (faint) for a short time. It is caused by a sudden decrease in blood flow to the brain. °· Signs that you may be about to faint include feeling dizzy, light-headed, or sick to your stomach, seeing all white or all black, or having cold, clammy skin. °· If you start to feel like you might pass out, lie down right away and raise (elevate) your feet above the level of your heart. Breathe deeply and steadily. Wait until all of the symptoms are gone. °This information is not intended to replace advice given to you by your health care provider. Make sure you discuss any questions you have with your health care provider. °Document Released: 11/20/2007 Document Revised: 07/16/2017 Document Reviewed: 07/16/2017 °Elsevier Patient Education © 2020 Elsevier Inc. ° °

## 2019-06-01 ENCOUNTER — Encounter: Payer: Self-pay | Admitting: Urology

## 2019-06-01 ENCOUNTER — Other Ambulatory Visit: Payer: Self-pay

## 2019-06-01 ENCOUNTER — Ambulatory Visit: Payer: Medicare Other | Admitting: Urology

## 2019-06-01 VITALS — BP 140/80 | HR 74 | Ht 68.0 in | Wt 181.0 lb

## 2019-06-01 DIAGNOSIS — N3941 Urge incontinence: Secondary | ICD-10-CM

## 2019-06-01 DIAGNOSIS — R350 Frequency of micturition: Secondary | ICD-10-CM | POA: Diagnosis not present

## 2019-06-01 DIAGNOSIS — R8271 Bacteriuria: Secondary | ICD-10-CM | POA: Diagnosis not present

## 2019-06-01 LAB — MICROSCOPIC EXAMINATION: WBC, UA: >30 /hpf — AB (ref 0–5)

## 2019-06-01 LAB — URINALYSIS, COMPLETE
Bilirubin, UA: NEGATIVE
Glucose, UA: NEGATIVE
Ketones, UA: NEGATIVE
Nitrite, UA: POSITIVE — AB
Protein,UA: NEGATIVE
Specific Gravity, UA: 1.02 (ref 1.005–1.030)
Urobilinogen, Ur: 0.2 mg/dL (ref 0.2–1.0)
pH, UA: 7 (ref 5.0–7.5)

## 2019-06-01 NOTE — Progress Notes (Signed)
06/01/2019 8:13 PM   Elizabeth Shepard [birth year] [id number]  Referring provider: Marisue Ivan, MD [phone] [facility] [address] [facility] [place],  [place] [address]  Chief Complaint  Patient presents with  . Recurrent UTI    HPI: 78 year old female known to me previously for history of recurrent urinary tract infections/more likely chronic bacterial colonization.  Notably, she was seen and evaluated for the same issue back in 2017.  At that time, she is being diagnosed with repeated urinary tract infections which were asymptomatic.  Over the past year, she was seen and evaluated and in fact admitted twice recently related to falls and weakness.  Incidental diagnosis of UTI was made on both occasions.  On this last occasion, there is a mixed documentation whether or not she actually had dysuria.  She denies any dysuria during that admission.  On both occasions, she was treated with antibiotics and her symptoms remained unchanged/stable.  At baseline, she does have urinary frequency every 2 hours as she mentioned 2 years ago.  She also gets up several times at night to void.  She does also have significant urgency associated with this.  She engages in toilet mapping.  This is bothersome to her.  She has mild stress incontinence but no significant urge incontinence.  She was given samples of Myrbetriq 25 mg at last appointment in 2017 but cannot recall trying this medication.  No history of flank pain or fevers associated with infections.  She is completely asymptomatic today and denies any urinary symptoms other than her baseline.  She was catheterized for UA today, had a residual of approximately 75 cc.  UA is grossly positive although she is asymptomatic.  She mentions today that she has a friend who is a nurse who is use the term cystocele.  She wonders what this is.  She denies any vaginal bulging.  She does sometimes have to strain to get her urine started.  She continues to  use intravaginal estrogen cream via applicator twice weekly.  She does not use it externally or per urethra.   PMH: Past Medical History:  Diagnosis Date  . Depression   . Generalized pain   . Hypercholesteremia   . Hypertension   . Hypothyroidism   . Thyroid disease   . UTI (lower urinary tract infection)     Surgical History: Past Surgical History:  Procedure Laterality Date  . AUGMENTATION MAMMAPLASTY Bilateral 1980  . BREAST BIOPSY Right 1970   neg  . COLONOSCOPY WITH PROPOFOL N/A 09/12/2015   Procedure: COLONOSCOPY WITH PROPOFOL;  Surgeon: Christena Deem, MD;  Location: [facility] ENDOSCOPY;  Service: Endoscopy;  Laterality: N/A;  . ESOPHAGOGASTRODUODENOSCOPY N/A 07/06/2015   Procedure: ESOPHAGOGASTRODUODENOSCOPY (EGD);  Surgeon: Christena Deem, MD;  Location: [facility] ENDOSCOPY;  Service: Endoscopy;  Laterality: N/A;  . ESOPHAGOGASTRODUODENOSCOPY (EGD) WITH PROPOFOL N/A 09/12/2015   Procedure: ESOPHAGOGASTRODUODENOSCOPY (EGD) WITH PROPOFOL;  Surgeon: Christena Deem, MD;  Location: [facility] ENDOSCOPY;  Service: Endoscopy;  Laterality: N/A;  . HIP SURGERY    . Mastoid surgery    . TONSILLECTOMY    . TOTAL HIP ARTHROPLASTY Bilateral     Home Medications:  Allergies as of 06/01/2019      Reactions   Ciprofloxacin Itching, Rash      Medication List       Accurate as of June 01, 2019  8:13 PM. If you have any questions, ask your nurse or doctor.        atorvastatin 40 MG tablet  Commonly known as: LIPITOR Take 40 mg by mouth daily.   conjugated estrogens vaginal cream Commonly known as: PREMARIN Place 1 Applicatorful vaginally 2 (two) times a week.   escitalopram 10 MG tablet Commonly known as: LEXAPRO Take 10 mg by mouth daily.   gabapentin 100 MG capsule Commonly known as: NEURONTIN Take 1 capsule (100 mg total) by mouth 2 (two) times daily.   Ibuprofen 200 MG Caps Commonly known as: Advil Take 3 capsules (600 mg total) by mouth daily as needed (Moderate  pain).   levothyroxine 25 MCG tablet Commonly known as: SYNTHROID Take 25 mcg by mouth daily before breakfast.   lisinopril 10 MG tablet Commonly known as: ZESTRIL Take 10 mg by mouth daily.   multivitamin with minerals Tabs tablet Take 1 tablet by mouth daily.   nystatin-triamcinolone ointment Commonly known as: MYCOLOG Apply 1 application topically 2 (two) times daily. Apply to left arm pit   pantoprazole 40 MG tablet Commonly known as: PROTONIX Take 40 mg by mouth daily.   polyethylene glycol 17 g packet Commonly known as: MIRALAX / GLYCOLAX Take 17 g by mouth daily as needed for mild constipation or moderate constipation.   traMADol 50 MG tablet Commonly known as: ULTRAM Take 1 tablet (50 mg total) by mouth every 8 (eight) hours as needed for severe pain.       Allergies:  Allergies  Allergen Reactions  . Ciprofloxacin Itching and Rash    Family History: Family History  Problem Relation Age of Onset  . CAD Mother   . Diabetes Mother   . CAD Father   . Diabetes Father   . Bladder Cancer Neg Hx   . Prostate cancer Neg Hx   . Kidney cancer Neg Hx     Social History:  reports that she has never smoked. She has never used smokeless tobacco. She reports that she does not drink alcohol or use drugs.  Physical Exam: BP 140/80   Pulse 74   Ht 5\' 8"  (1.727 m)   Wt 181 lb (82.1 kg)   BMI 27.52 kg/m   Constitutional:  Alert and oriented, No acute distress. HEENT: [place] AT, moist mucus membranes.  Trachea midline, no masses. Cardiovascular: No clubbing, cyanosis, or edema. Respiratory: Normal respiratory effort, no increased work of breathing. Skin: No rashes, bruises or suspicious lesions. Neurologic: Grossly intact, no focal deficits, moving all 4 extremities. Psychiatric: Normal mood and affect.  Laboratory Data: Lab Results  Component Value Date   WBC 6.9 04/24/2019   HGB 10.8 (L) 04/24/2019   HCT 33.1 (L) 04/24/2019   MCV 94.0 04/24/2019   PLT 300  04/24/2019    Lab Results  Component Value Date   CREATININE 0.81 04/24/2019   Urinalysis Results for orders placed or performed in visit on 06/01/19  Microscopic Examination   URINE  Result Value Ref Range   WBC, UA >30 (A) 0 - 5 /hpf   RBC 3-10 (A) 0 - 2 /hpf   Epithelial Cells (non renal) 0-10 0 - 10 /hpf   Bacteria, UA Many (A) None seen/Few  Urinalysis, Complete  Result Value Ref Range   Specific Gravity, UA 1.020 1.005 - 1.030   pH, UA 7.0 5.0 - 7.5   Color, UA Yellow Yellow   Appearance Ur Cloudy (A) Clear   Leukocytes,UA 3+ (A) Negative   Protein,UA Negative Negative/Trace   Glucose, UA Negative Negative   Ketones, UA Negative Negative   RBC, UA Trace (A) Negative   Bilirubin,  UA Negative Negative   Urobilinogen, Ur 0.2 0.2 - 1.0 mg/dL   Nitrite, UA Positive (A) Negative   Microscopic Examination See below:     Pertinent Imaging: Results for orders placed during the hospital encounter of 09/26/15  US Renal   Narrative CLINICAL DATA:  Recurrent urinary tract infections.  EXAM: RENAL / URINARY TRACT ULTRASOUND COMPLETE  COMPARISON:  None in PACs  FINDINGS: Right Kidney:  Length: 9.8 cm. Echogenicity within normal limits. No mass or hydronephrosis visualized.  Left Kidney:  Length: 9.3 cm. Echogenicity within normal limits. No mass or hydronephrosis visualized.  Bladder:  The urinary bladder is normal in contour. Ureteral jets are observed bilaterally. The prevoid volume with 174 cc. The postvoid volume is approximately 60 cc.  IMPRESSION: Normal appearance of the kidneys for age. There is no hydronephrosis. Moderate-sized residual urinary bladder volume of 60 cc.   Electronically Signed   By: David  SwazilandJordan M.D.   On: 09/26/2015 15:43    Assessment & Plan:    1. Bacteriuria, chronic Do not suspect that she has had true urinary tract infections, rather that she is chronically colonized with bacteria and is otherwise asymptomatic  As per  previous discussion, recommend antibiotic stewardship.  Will not treat unless she develops dysuria or change from her baseline urinary symptoms.  Continued estrogen cream, consider using per urethral meatus in addition to intravaginally. - Urinalysis, Complete  2. Urinary frequency Samples of Myrbetriq 25 mg given again today  Check PVR next visit  Plan for pelvic exam with cystoscopy to rule out any underlying anatomic issues to which she is agreeable  3. Urge incontinence As above  A summary of our discussion and recommendations was typed up for the patient today and included in her patient instructions.  See below.  Return in about 1 month (around 07/02/2019) for cysto/ PVR/ pelvic.     Vanna ScotlandAshley Euriah Matlack, MD  [facility] [address], [address] [place], [place] [address] [phone]   Patient instructions: We discussed the following during your visit today:  1.  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  This means you live in [place] with the bacteria in your bladder.  We would only recommend treating you if you have signs or symptoms of infection including pelvic pain, burning with urination fevers or chills.  All of your other symptoms are nonspecific.  2.  Based on your symptoms, you likely have overactive bladder.  Been on medication for this in the past.  You have been given samples of Myrbetriq 25 mg which helps with bladder overactivity and decreases your urgency and frequency.  We will check and see how effective this was at your follow-up.  3.  You are currently using estrogen within your vagina.  Recommend using a small pea-sized amount on your urethra as well.  4.  We will have you return in about 4 weeks for cystoscopy to evaluate the lining of your bladder and rule out any other underlying issues.  Also perform a pelvic exam at that time to check for cystocele.  We will also do a bladder scan to see how you empty your  bladder.

## 2019-06-01 NOTE — Patient Instructions (Signed)
We discussed the following during your visit today:  1.  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  This means you live in [place] with the bacteria in your bladder.  We would only recommend treating you if you have signs or symptoms of infection including pelvic pain, burning with urination fevers or chills.  All of your other symptoms are nonspecific.  2.  Based on your symptoms, you likely have overactive bladder.  Been on medication for this in the past.  You have been given samples of Myrbetriq 25 mg which helps with bladder overactivity and decreases your urgency and frequency.  We will check and see how effective this was at your follow-up.  3.  You are currently using estrogen within your vagina.  Recommend using a small pea-sized amount on your urethra as well.  4.  We will have you return in about 4 weeks for cystoscopy to evaluate the lining of your bladder and rule out any other underlying issues.  Also perform a pelvic exam at that time to check for cystocele.  We will also do a bladder scan to see how you empty your bladder.

## 2019-06-22 ENCOUNTER — Ambulatory Visit: Payer: Medicare Other | Admitting: Urology

## 2019-06-22 ENCOUNTER — Telehealth: Payer: Self-pay | Admitting: Urology

## 2019-06-22 MED ORDER — MIRABEGRON ER 25 MG PO TB24: 25.0000 mg | ORAL_TABLET | Freq: Every day | ORAL | 0 refills | Status: DC

## 2019-06-22 NOTE — Telephone Encounter (Signed)
Pt calls office stating she was given samples of Mybetriq at her last visit to last her until her upcoming appt, however pt states when she got home, 1 of the boxes were empty, having no samples in it. Pt states she will run out before her next appt. Please advise pt at [phone].

## 2019-06-22 NOTE — Telephone Encounter (Signed)
Offered to have samples up front for patient, she declined. She prefers to have RX called in, [facility] to [facility] as requested.

## 2019-07-06 ENCOUNTER — Ambulatory Visit: Payer: Medicare Other | Admitting: Urology

## 2019-07-06 ENCOUNTER — Other Ambulatory Visit: Payer: Self-pay

## 2019-07-06 ENCOUNTER — Encounter: Payer: Self-pay | Admitting: Urology

## 2019-07-06 VITALS — BP 140/78 | HR 79 | Ht 68.0 in | Wt 181.0 lb

## 2019-07-06 DIAGNOSIS — N3941 Urge incontinence: Secondary | ICD-10-CM

## 2019-07-06 DIAGNOSIS — N393 Stress incontinence (female) (male): Secondary | ICD-10-CM

## 2019-07-06 DIAGNOSIS — R350 Frequency of micturition: Secondary | ICD-10-CM

## 2019-07-06 DIAGNOSIS — R8271 Bacteriuria: Secondary | ICD-10-CM | POA: Diagnosis not present

## 2019-07-06 LAB — URINALYSIS, COMPLETE
Bilirubin, UA: NEGATIVE
Glucose, UA: NEGATIVE
Ketones, UA: NEGATIVE
Nitrite, UA: POSITIVE — AB
Protein,UA: NEGATIVE
Specific Gravity, UA: 1.025 (ref 1.005–1.030)
Urobilinogen, Ur: 0.2 mg/dL (ref 0.2–1.0)
pH, UA: 6 (ref 5.0–7.5)

## 2019-07-06 LAB — MICROSCOPIC EXAMINATION
RBC, Urine: NONE SEEN /hpf (ref 0–2)
WBC, UA: >30 /hpf — AB (ref 0–5)

## 2019-07-06 LAB — BLADDER SCAN AMB NON-IMAGING

## 2019-07-06 NOTE — Progress Notes (Signed)
07/06/19  CC:  Chief Complaint  Patient presents with  . Cysto    HPI: 79 year old female with chronic bacteriuria who presents today for cystoscopic evaluation.  In the interim, she tried Myrbetriq 25 mg which she believes helped some with her incontinence.  She continues to have some urgency and frequency but this is moderately improved.  She asks again today about whether or not she has a cystocele.  A nurse friend mention this to her.  No fevers or chills.  No dysuria.  Blood pressure 140/78, pulse 79, height 5\' 8"  (1.727 m), weight 181 lb (82.1 kg). NED. A&Ox3.   No respiratory distress   Abd soft, NT, ND Normal external genitalia with patent urethral meatus Pelvic exam today shows mildly atrophic vaginal and periurethral tissue.  She has demonstrable hypermobility with Valsalva as well as demonstrable stress incontinence with a full bladder per urethra.  Very minimal apical and anterior descent, no clinically significant cystocele or rectocele appreciated.  Good vaginal support.  Cystoscopy Procedure Note  Patient identification was confirmed, informed consent was obtained, and patient was prepped using Betadine solution.  Lidocaine jelly was administered per urethral meatus.    Procedure: - Flexible cystoscope introduced, without any difficulty.   - Thorough search of the bladder revealed:    normal urethral meatus    normal urothelium with mild inflammatory transition along the trigone and debris consistent with chronic bacteriuria without any mucosal lesions or tumors.    no stones    no ulcers     no tumors    no urethral polyps    no trabeculation  - Ureteral orifices were normal in position and appearance.  Post-Procedure: - Patient tolerated the procedure well  Results for orders placed or performed in visit on 07/06/19  Bladder Scan (Post Void Residual) in office  Result Value Ref Range   Scan Result 24ml      Assessment/ Plan:  1. Bacteriuria,  chronic Relatively asymptomatic in terms of traditional UTI symptoms including dysuria, fevers chills or bacteremia  We reviewed the definition of chronic bacteriuria again today and she was provided another handout.  Cystoscopic evidence of chronic bacteriuria without any other significant findings. - Urinalysis, Complete  2. Urinary frequency We will try to optimize beta 3 agonist dose, Myrbetriq 50 mg x 4 weeks given today.  She is concerned about the cost of medication.  Will cost her $40 per month which is above her budget when her combined income monthly is only $2000.  The pharmacist gave her a few names of medications of which are anticholinergics.  Given her relative confusion, repetitiveness, I have some concerns about mental status decline and thus do not feel that she is a good candidate for these medications.  We will recheck her symptoms in a month and reassess.  She may be a good candidate for refractory treatment options such as Botox or posterior nerve stimulation as alternatives.  In addition to the above, we may discuss voiding diary in the future to more objectively quantify her symptoms.  Adequate bladder emptying today despite concern for incomplete bladder emptying.  PVR as above.  3. Urge incontinence As above - Bladder Scan (Post Void Residual) in office  4. Stress incontinence, female Demonstrable stress incontinence which is not her primary complaint   4 weeks to reassess symptoms  50m, MD   We discussed the following during your visit today:  1.  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  This means  you live in [place] with the bacteria in your bladder.  We would only recommend treating you if you have signs or symptoms of infection including pelvic pain, burning with urination fevers or chills.  All of your other symptoms are nonspecific.   2.  Based on your symptoms, you likely have overactive bladder.  You have been given  samples of Myrbetriq 50 mg today which helps with bladder overactivity and decreases your urgency and frequency.  We will check and see how effective this was at your follow-up.  WE are trying a higher dose this time to see if this works better.    3.  You are currently using estrogen within your vagina.  Recommend using a small pea-sized amount on your urethra as well.  Continue this.  4.  You do not have a cystocele.  You do have stress incontinence.    5.  I am worried about your memory.  I don't want to give you a drug that can cause cloudy thinking or confusion such as oxybutynin, detrol, or vesicare.

## 2019-07-06 NOTE — Patient Instructions (Signed)
We discussed the following during your visit today:  1.  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  This means you live in [place] with the bacteria in your bladder.  We would only recommend treating you if you have signs or symptoms of infection including pelvic pain, burning with urination fevers or chills.  All of your other symptoms are nonspecific.   2.  Based on your symptoms, you likely have overactive bladder.  You have been given samples of Myrbetriq 50 mg today which helps with bladder overactivity and decreases your urgency and frequency.  We will check and see how effective this was at your follow-up.  WE are trying a higher dose this time to see if this works better.    3.  You are currently using estrogen within your vagina.  Recommend using a small pea-sized amount on your urethra as well.  Continue this.  4.  You do not have a cystocele.  You do have stress incontinence.    5.  I am worried about your memory.  I don't want to give you a drug that can cause cloudy thinking or confusion such as oxybutynin, detrol, or vesicare.

## 2019-07-12 ENCOUNTER — Telehealth: Payer: Self-pay | Admitting: Urology

## 2019-07-12 NOTE — Telephone Encounter (Signed)
Pt called and requests a call back to discuss options for Myrbetriq due to cost. She asked if someone could call her back to discuss, she is almost out of samples.

## 2019-07-14 NOTE — Telephone Encounter (Signed)
Explained per Dr. Delana Meyer note-she wanted her to try Myrbetriq for one month then reassess symptoms. She did not want to prescribe any other medications due to confusion/memory issues. Patient voiced understanding. She has enough samples to last one month, understood she did not need to fill prescription till she is out of samples if it is improving her symptoms.

## 2019-08-03 ENCOUNTER — Encounter: Payer: Self-pay | Admitting: Physician Assistant

## 2019-08-03 ENCOUNTER — Other Ambulatory Visit: Payer: Self-pay

## 2019-08-03 ENCOUNTER — Ambulatory Visit: Payer: Medicare Other | Admitting: Physician Assistant

## 2019-08-03 VITALS — BP 114/75 | HR 85 | Ht 63.0 in | Wt 170.0 lb

## 2019-08-03 DIAGNOSIS — R350 Frequency of micturition: Secondary | ICD-10-CM

## 2019-08-03 MED ORDER — MIRABEGRON ER 25 MG PO TB24: 25.0000 mg | ORAL_TABLET | Freq: Every day | ORAL | 5 refills | Status: AC

## 2019-08-03 NOTE — Progress Notes (Signed)
08/03/2019 4:37 PM   Elizabeth Shepard 1940/09/09 [id number]  CC: Symptom recheck  HPI: Elizabeth Shepard is a 79 y.o. female who presents today for symptom recheck on Myrbetriq 50 mg daily.  She has previously been seen by Dr. Erlene Quan for management of chronic bacteriuria, urinary frequency, urgency, and incontinence.  She previously completed a trial of Myrbetriq 25 mg daily and reported improvement in urgency and frequency without resolution.  She was recommended to increase the dose to Myrbetriq 50 mg daily for medical optimization.  Today, she reports that she has not observed improvement in her urinary symptoms, however she does believe she is experiencing side effects of the increased dose of Myrbetriq including constipation and general "slowing down" of her body.  She reports concerns of the cost of Myrbetriq and wonders about alternative agents for management of her urinary symptoms.  I explained to her that she is a poor candidate for alternative agents due to her age and concern for confusion.  She does not wish to proceed with Botox or PTNS at this time due to the need for repeat treatments with both of these.  She reports that she does not believe she would benefit from completing a voiding diary, as she states she is well aware of how often she urinates.  PMH: Past Medical History:  Diagnosis Date  . Depression   . Generalized pain   . Hypercholesteremia   . Hypertension   . Hypothyroidism   . Thyroid disease   . UTI (lower urinary tract infection)     Surgical History: Past Surgical History:  Procedure Laterality Date  . AUGMENTATION MAMMAPLASTY Bilateral 1980  . BREAST BIOPSY Right 1970   neg  . COLONOSCOPY WITH PROPOFOL N/A 09/12/2015   Procedure: COLONOSCOPY WITH PROPOFOL;  Surgeon: Lollie Sails, MD;  Location: [facility] ENDOSCOPY;  Service: Endoscopy;  Laterality: N/A;  . ESOPHAGOGASTRODUODENOSCOPY N/A 07/06/2015   Procedure: ESOPHAGOGASTRODUODENOSCOPY (EGD);   Surgeon: Lollie Sails, MD;  Location: [facility] ENDOSCOPY;  Service: Endoscopy;  Laterality: N/A;  . ESOPHAGOGASTRODUODENOSCOPY (EGD) WITH PROPOFOL N/A 09/12/2015   Procedure: ESOPHAGOGASTRODUODENOSCOPY (EGD) WITH PROPOFOL;  Surgeon: Lollie Sails, MD;  Location: [facility] ENDOSCOPY;  Service: Endoscopy;  Laterality: N/A;  . HIP SURGERY    . Mastoid surgery    . TONSILLECTOMY    . TOTAL HIP ARTHROPLASTY Bilateral     Home Medications:  Allergies as of 08/03/2019      Reactions   Ciprofloxacin Itching, Rash      Medication List       Accurate as of August 03, 2019  4:37 PM. If you have any questions, ask your nurse or doctor.        atorvastatin 40 MG tablet Commonly known as: LIPITOR Take 40 mg by mouth daily.   conjugated estrogens vaginal cream Commonly known as: PREMARIN Place 1 Applicatorful vaginally 2 (two) times a week.   escitalopram 10 MG tablet Commonly known as: LEXAPRO Take 10 mg by mouth daily.   gabapentin 100 MG capsule Commonly known as: NEURONTIN Take 1 capsule (100 mg total) by mouth 2 (two) times daily.   Ibuprofen 200 MG Caps Commonly known as: Advil Take 3 capsules (600 mg total) by mouth daily as needed (Moderate pain).   levothyroxine 25 MCG tablet Commonly known as: SYNTHROID Take 25 mcg by mouth daily before breakfast.   lisinopril 10 MG tablet Commonly known as: ZESTRIL Take 10 mg by mouth daily.   mirabegron ER 25 MG Tb24 tablet  Commonly known as: MYRBETRIQ Take 1 tablet (25 mg total) by mouth daily.   multivitamin with minerals Tabs tablet Take 1 tablet by mouth daily.   nystatin-triamcinolone ointment Commonly known as: MYCOLOG Apply 1 application topically 2 (two) times daily. Apply to left arm pit   pantoprazole 40 MG tablet Commonly known as: PROTONIX Take 40 mg by mouth daily.   polyethylene glycol 17 g packet Commonly known as: MIRALAX / GLYCOLAX Take 17 g by mouth daily as needed for mild constipation or moderate  constipation.   traMADol 50 MG tablet Commonly known as: ULTRAM Take 1 tablet (50 mg total) by mouth every 8 (eight) hours as needed for severe pain.       Allergies:  Allergies  Allergen Reactions  . Ciprofloxacin Itching and Rash    Family History: Family History  Problem Relation Age of Onset  . CAD Mother   . Diabetes Mother   . CAD Father   . Diabetes Father   . Bladder Cancer Neg Hx   . Prostate cancer Neg Hx   . Kidney cancer Neg Hx     Social History:   reports that she has never smoked. She has never used smokeless tobacco. She reports that she does not drink alcohol or use drugs.  Physical Exam: BP 114/75 (BP Location: Left Arm, Patient Position: Sitting, Cuff Size: Large)   Pulse 85   Ht 5\' 3"  (1.6 m)   Wt 170 lb (77.1 kg)   BMI 30.11 kg/m   Constitutional:  Alert and oriented, no acute distress, nontoxic appearing HEENT: [place], AT Cardiovascular: No clubbing, cyanosis, or edema Respiratory: Normal respiratory effort, no increased work of breathing Skin: No rashes, bruises or suspicious lesions Neurologic: Grossly intact, no focal deficits, moving all 4 extremities Psychiatric: Normal mood and affect  Assessment & Plan:   1. Urinary frequency 79 year old female with a history of chronic bacteriuria and OAB symptoms including urinary frequency presents for symptom recheck on a trial of Myrbetriq 50 mg daily.  She reports no increased therapeutic benefit on the increased dose of this medication, however she does believe she is having more side effects with it.  I counseled her to return to Myrbetriq 25 mg daily dose.  Patient is resistant to continuing with Myrbetriq due to cost concerns, however she is a poor candidate for anticholinergics and she is also unwilling to undergo PTNS or intravesical Botox injections.  Ultimately, she agrees to continue with Myrbetriq 25 mg.  Prescription sent to her pharmacy.  We will have her follow-up in 6 months for symptom  recheck.  Return in about 6 months (around 01/31/2020) for Symptom recheck with Dr. 02/02/2020.  Elizabeth Junes, PA-C  [facility] [address], [address] [place], [place] [phone]

## 2019-09-21 ENCOUNTER — Other Ambulatory Visit: Payer: Self-pay

## 2019-09-21 ENCOUNTER — Encounter (HOSPITAL_COMMUNITY): Payer: Self-pay | Admitting: Orthopedic Surgery

## 2019-09-21 NOTE — Progress Notes (Signed)
Spoke with Jodell Cipro, [facility] regarding NPO status prior to hospital.  Patient may have clear liquids until 1030am per Sharmon Revere.  o,PAC. Called and spoke with patient and husband and they are aware clear liquids untl 1030am on 09/22/19.  Reviewed clear liquid diet with both.

## 2019-09-22 ENCOUNTER — Inpatient Hospital Stay (HOSPITAL_COMMUNITY)
Admission: RE | Admit: 2019-09-22 | Discharge: 2019-09-25 | DRG: 467 | Disposition: A | Discharge status: Home Health | Payer: Medicare Other | Attending: Orthopedic Surgery | Admitting: Orthopedic Surgery

## 2019-09-22 ENCOUNTER — Inpatient Hospital Stay (HOSPITAL_COMMUNITY): Payer: Medicare Other | Admitting: Certified Registered Nurse Anesthetist

## 2019-09-22 ENCOUNTER — Encounter (HOSPITAL_COMMUNITY)
Admission: RE | Disposition: A | Discharge status: Home Health | Payer: Self-pay | Source: Home / Self Care | Attending: Orthopedic Surgery

## 2019-09-22 ENCOUNTER — Encounter (HOSPITAL_COMMUNITY): Payer: Self-pay | Admitting: Orthopedic Surgery

## 2019-09-22 DIAGNOSIS — B9561 Methicillin susceptible Staphylococcus aureus infection as the cause of diseases classified elsewhere: Secondary | ICD-10-CM | POA: Diagnosis present

## 2019-09-22 DIAGNOSIS — Y831 Surgical operation with implant of artificial internal device as the cause of abnormal reaction of the patient, or of later complication, without mention of misadventure at the time of the procedure: Secondary | ICD-10-CM | POA: Diagnosis present

## 2019-09-22 DIAGNOSIS — Z8249 Family history of ischemic heart disease and other diseases of the circulatory system: Secondary | ICD-10-CM

## 2019-09-22 DIAGNOSIS — F329 Major depressive disorder, single episode, unspecified: Secondary | ICD-10-CM | POA: Diagnosis present

## 2019-09-22 DIAGNOSIS — E785 Hyperlipidemia, unspecified: Secondary | ICD-10-CM | POA: Diagnosis present

## 2019-09-22 DIAGNOSIS — Z9089 Acquired absence of other organs: Secondary | ICD-10-CM

## 2019-09-22 DIAGNOSIS — M00051 Staphylococcal arthritis, right hip: Secondary | ICD-10-CM | POA: Diagnosis present

## 2019-09-22 DIAGNOSIS — T8451XA Infection and inflammatory reaction due to internal right hip prosthesis, initial encounter: Secondary | ICD-10-CM | POA: Diagnosis present

## 2019-09-22 DIAGNOSIS — I10 Essential (primary) hypertension: Secondary | ICD-10-CM | POA: Diagnosis present

## 2019-09-22 DIAGNOSIS — M009 Pyogenic arthritis, unspecified: Secondary | ICD-10-CM | POA: Diagnosis present

## 2019-09-22 DIAGNOSIS — Z96642 Presence of left artificial hip joint: Secondary | ICD-10-CM | POA: Diagnosis present

## 2019-09-22 DIAGNOSIS — F419 Anxiety disorder, unspecified: Secondary | ICD-10-CM | POA: Diagnosis present

## 2019-09-22 DIAGNOSIS — Z833 Family history of diabetes mellitus: Secondary | ICD-10-CM | POA: Diagnosis not present

## 2019-09-22 DIAGNOSIS — Y792 Prosthetic and other implants, materials and accessory orthopedic devices associated with adverse incidents: Secondary | ICD-10-CM | POA: Diagnosis present

## 2019-09-22 DIAGNOSIS — Z881 Allergy status to other antibiotic agents status: Secondary | ICD-10-CM

## 2019-09-22 DIAGNOSIS — E039 Hypothyroidism, unspecified: Secondary | ICD-10-CM | POA: Diagnosis present

## 2019-09-22 DIAGNOSIS — Z1629 Resistance to other single specified antibiotic: Secondary | ICD-10-CM | POA: Diagnosis present

## 2019-09-22 DIAGNOSIS — Z96641 Presence of right artificial hip joint: Secondary | ICD-10-CM | POA: Diagnosis not present

## 2019-09-22 DIAGNOSIS — Z20822 Contact with and (suspected) exposure to covid-19: Secondary | ICD-10-CM | POA: Diagnosis present

## 2019-09-22 HISTORY — PX: INCISION AND DRAINAGE HIP: SHX1801

## 2019-09-22 HISTORY — DX: Anxiety disorder, unspecified: F41.9

## 2019-09-22 LAB — CBC
HCT: 36.2 % (ref 36.0–46.0)
Hemoglobin: 11.1 g/dL — ABNORMAL LOW (ref 12.0–15.0)
MCH: 27.7 pg (ref 26.0–34.0)
MCHC: 30.7 g/dL (ref 30.0–36.0)
MCV: 90.3 fL (ref 80.0–100.0)
Platelets: 544 10*3/uL — ABNORMAL HIGH (ref 150–400)
RBC: 4.01 MIL/uL (ref 3.87–5.11)
RDW: 14.4 % (ref 11.5–15.5)
WBC: 8.4 10*3/uL (ref 4.0–10.5)
nRBC: 0 % (ref 0.0–0.2)

## 2019-09-22 LAB — COMPREHENSIVE METABOLIC PANEL
ALT: 17 U/L (ref 0–44)
AST: 29 U/L (ref 15–41)
Albumin: 3.9 g/dL (ref 3.5–5.0)
Alkaline Phosphatase: 71 U/L (ref 38–126)
Anion gap: 12 (ref 5–15)
BUN: 23 mg/dL (ref 8–23)
CO2: 19 mmol/L — ABNORMAL LOW (ref 22–32)
Calcium: 9.3 mg/dL (ref 8.9–10.3)
Chloride: 105 mmol/L (ref 98–111)
Creatinine, Ser: 1.03 mg/dL — ABNORMAL HIGH (ref 0.44–1.00)
GFR calc Af Amer: >60 mL/min (ref >60)
GFR calc non Af Amer: 52 mL/min — ABNORMAL LOW (ref >60)
Glucose, Bld: 117 mg/dL — ABNORMAL HIGH (ref 70–99)
Potassium: 4.6 mmol/L (ref 3.5–5.1)
Sodium: 136 mmol/L (ref 135–145)
Total Bilirubin: 1 mg/dL (ref 0.3–1.2)
Total Protein: 8.1 g/dL (ref 6.5–8.1)

## 2019-09-22 LAB — RESPIRATORY PANEL BY RT PCR (FLU A&B, COVID)
Influenza A by PCR: NEGATIVE
Influenza B by PCR: NEGATIVE
SARS Coronavirus 2 by RT PCR: NEGATIVE

## 2019-09-22 LAB — TYPE AND SCREEN
ABO/RH(D): O POS
Antibody Screen: NEGATIVE

## 2019-09-22 LAB — APTT: aPTT: 29 seconds (ref 24–36)

## 2019-09-22 LAB — PROTIME-INR
INR: 1 (ref 0.8–1.2)
Prothrombin Time: 12.8 seconds (ref 11.4–15.2)

## 2019-09-22 SURGERY — IRRIGATION AND DEBRIDEMENT HIP WITH POLY EXCHANGE
Anesthesia: General | Site: Hip | Laterality: Right

## 2019-09-22 MED ORDER — STERILE WATER FOR IRRIGATION IR SOLN
Status: DC | PRN
  Administered 2019-09-22: 2000 mL

## 2019-09-22 MED ORDER — DEXAMETHASONE SODIUM PHOSPHATE 10 MG/ML IJ SOLN: 8.0000 mg | Freq: Once | INTRAMUSCULAR | Status: DC

## 2019-09-22 MED ORDER — 0.9 % SODIUM CHLORIDE (POUR BTL) OPTIME
TOPICAL | Status: DC | PRN
  Administered 2019-09-22: 1000 mL

## 2019-09-22 MED ORDER — EPHEDRINE SULFATE-NACL 50-0.9 MG/10ML-% IV SOSY
PREFILLED_SYRINGE | INTRAVENOUS | Status: DC | PRN
  Administered 2019-09-22 (×3): 10 mg via INTRAVENOUS

## 2019-09-22 MED ORDER — PHENOL 1.4 % MT LIQD: 1.0000 | OROMUCOSAL | Status: DC | PRN

## 2019-09-22 MED ORDER — ESCITALOPRAM OXALATE 10 MG PO TABS
10.0000 mg | ORAL_TABLET | Freq: Every day | ORAL | Status: DC
  Administered 2019-09-22 – 2019-09-25 (×4): 10 mg via ORAL
  Filled 2019-09-22 (×4): qty 1

## 2019-09-22 MED ORDER — ONDANSETRON HCL 4 MG/2ML IJ SOLN: 4.0000 mg | Freq: Four times a day (QID) | INTRAMUSCULAR | Status: DC | PRN

## 2019-09-22 MED ORDER — HYDROMORPHONE HCL 1 MG/ML IJ SOLN
INTRAMUSCULAR | Status: AC
  Filled 2019-09-22: qty 1

## 2019-09-22 MED ORDER — LACTATED RINGERS IV SOLN: INTRAVENOUS | Status: DC

## 2019-09-22 MED ORDER — POVIDONE-IODINE 10 % EX SWAB
2.0000 "application " | Freq: Once | CUTANEOUS | Status: AC
  Administered 2019-09-22: 2 via TOPICAL

## 2019-09-22 MED ORDER — POLYETHYLENE GLYCOL 3350 17 G PO PACK
17.0000 g | PACK | Freq: Every day | ORAL | Status: DC | PRN
  Administered 2019-09-23 – 2019-09-24 (×2): 17 g via ORAL
  Filled 2019-09-22 (×2): qty 1

## 2019-09-22 MED ORDER — METHOCARBAMOL 500 MG IVPB - SIMPLE MED
500.0000 mg | Freq: Four times a day (QID) | INTRAVENOUS | Status: DC | PRN
  Filled 2019-09-22: qty 50

## 2019-09-22 MED ORDER — FENTANYL CITRATE (PF) 100 MCG/2ML IJ SOLN
INTRAMUSCULAR | Status: AC
  Filled 2019-09-22: qty 2

## 2019-09-22 MED ORDER — MIDAZOLAM HCL 2 MG/2ML IJ SOLN
INTRAMUSCULAR | Status: DC | PRN
  Administered 2019-09-22 (×2): 1 mg via INTRAVENOUS

## 2019-09-22 MED ORDER — LIDOCAINE 2% (20 MG/ML) 5 ML SYRINGE
INTRAMUSCULAR | Status: AC
  Filled 2019-09-22: qty 5

## 2019-09-22 MED ORDER — ONDANSETRON HCL 4 MG PO TABS
4.0000 mg | ORAL_TABLET | Freq: Four times a day (QID) | ORAL | Status: DC | PRN
  Administered 2019-09-24: 4 mg via ORAL
  Filled 2019-09-22: qty 1

## 2019-09-22 MED ORDER — HYDROCODONE-ACETAMINOPHEN 5-325 MG PO TABS
1.0000 | ORAL_TABLET | ORAL | Status: DC | PRN
  Administered 2019-09-22: 2 via ORAL
  Administered 2019-09-22: 1 via ORAL
  Administered 2019-09-23 – 2019-09-25 (×4): 2 via ORAL
  Filled 2019-09-22 (×4): qty 2
  Filled 2019-09-22: qty 1
  Filled 2019-09-22: qty 2

## 2019-09-22 MED ORDER — TRANEXAMIC ACID-NACL 1000-0.7 MG/100ML-% IV SOLN
1000.0000 mg | INTRAVENOUS | Status: AC
  Administered 2019-09-22: 1000 mg via INTRAVENOUS
  Filled 2019-09-22: qty 100

## 2019-09-22 MED ORDER — DEXAMETHASONE SODIUM PHOSPHATE 10 MG/ML IJ SOLN
10.0000 mg | Freq: Once | INTRAMUSCULAR | Status: AC
  Administered 2019-09-23: 10 mg via INTRAVENOUS
  Filled 2019-09-22: qty 1

## 2019-09-22 MED ORDER — IRRISEPT - 450ML BOTTLE WITH 0.05% CHG IN STERILE WATER, USP 99.95% OPTIME
TOPICAL | Status: DC | PRN
  Administered 2019-09-22: 450 mL

## 2019-09-22 MED ORDER — DEXAMETHASONE SODIUM PHOSPHATE 10 MG/ML IJ SOLN
INTRAMUSCULAR | Status: DC | PRN
  Administered 2019-09-22: 10 mg via INTRAVENOUS

## 2019-09-22 MED ORDER — PROPOFOL 10 MG/ML IV BOLUS
INTRAVENOUS | Status: AC
  Filled 2019-09-22: qty 20

## 2019-09-22 MED ORDER — SODIUM CHLORIDE 0.9 % IV SOLN: INTRAVENOUS | Status: DC

## 2019-09-22 MED ORDER — SUGAMMADEX SODIUM 200 MG/2ML IV SOLN
INTRAVENOUS | Status: DC | PRN
  Administered 2019-09-22: 175 mg via INTRAVENOUS

## 2019-09-22 MED ORDER — METOCLOPRAMIDE HCL 5 MG/ML IJ SOLN: 5.0000 mg | Freq: Three times a day (TID) | INTRAMUSCULAR | Status: DC | PRN

## 2019-09-22 MED ORDER — MENTHOL 3 MG MT LOZG: 1.0000 | LOZENGE | OROMUCOSAL | Status: DC | PRN

## 2019-09-22 MED ORDER — HYDROMORPHONE HCL 1 MG/ML IJ SOLN
0.2500 mg | INTRAMUSCULAR | Status: DC | PRN
  Administered 2019-09-22 (×2): 0.5 mg via INTRAVENOUS

## 2019-09-22 MED ORDER — CEFAZOLIN SODIUM-DEXTROSE 2-4 GM/100ML-% IV SOLN
2.0000 g | INTRAVENOUS | Status: AC
  Administered 2019-09-22: 2 g via INTRAVENOUS
  Filled 2019-09-22: qty 100

## 2019-09-22 MED ORDER — SODIUM CHLORIDE 0.9 % IR SOLN
Status: DC | PRN
  Administered 2019-09-22: 3000 mL

## 2019-09-22 MED ORDER — ALBUMIN HUMAN 5 % IV SOLN
INTRAVENOUS | Status: AC
  Filled 2019-09-22: qty 250

## 2019-09-22 MED ORDER — LIDOCAINE 2% (20 MG/ML) 5 ML SYRINGE
INTRAMUSCULAR | Status: DC | PRN
  Administered 2019-09-22: 20 mg via INTRAVENOUS

## 2019-09-22 MED ORDER — ACETAMINOPHEN 325 MG PO TABS
325.0000 mg | ORAL_TABLET | Freq: Four times a day (QID) | ORAL | Status: DC | PRN
  Administered 2019-09-24: 650 mg via ORAL
  Filled 2019-09-22: qty 2

## 2019-09-22 MED ORDER — ONDANSETRON HCL 4 MG/2ML IJ SOLN
INTRAMUSCULAR | Status: DC | PRN
  Administered 2019-09-22: 4 mg via INTRAVENOUS

## 2019-09-22 MED ORDER — MIDAZOLAM HCL 2 MG/2ML IJ SOLN
INTRAMUSCULAR | Status: AC
  Filled 2019-09-22: qty 2

## 2019-09-22 MED ORDER — CEFAZOLIN SODIUM-DEXTROSE 2-4 GM/100ML-% IV SOLN
2.0000 g | Freq: Four times a day (QID) | INTRAVENOUS | Status: AC
  Administered 2019-09-22 – 2019-09-23 (×2): 2 g via INTRAVENOUS
  Filled 2019-09-22 (×2): qty 100

## 2019-09-22 MED ORDER — MEPERIDINE HCL 50 MG/ML IJ SOLN: 6.2500 mg | INTRAMUSCULAR | Status: DC | PRN

## 2019-09-22 MED ORDER — MORPHINE SULFATE (PF) 2 MG/ML IV SOLN
0.5000 mg | INTRAVENOUS | Status: DC | PRN
  Administered 2019-09-22: 0.5 mg via INTRAVENOUS
  Filled 2019-09-22: qty 1

## 2019-09-22 MED ORDER — MIDAZOLAM HCL 2 MG/2ML IJ SOLN: 0.5000 mg | Freq: Once | INTRAMUSCULAR | Status: DC | PRN

## 2019-09-22 MED ORDER — DOCUSATE SODIUM 100 MG PO CAPS
100.0000 mg | ORAL_CAPSULE | Freq: Two times a day (BID) | ORAL | Status: DC
  Administered 2019-09-22 – 2019-09-25 (×6): 100 mg via ORAL
  Filled 2019-09-22 (×6): qty 1

## 2019-09-22 MED ORDER — METHOCARBAMOL 500 MG IVPB - SIMPLE MED
INTRAVENOUS | Status: AC
  Administered 2019-09-22: 500 mg via INTRAVENOUS
  Filled 2019-09-22: qty 50

## 2019-09-22 MED ORDER — ASPIRIN EC 325 MG PO TBEC
325.0000 mg | DELAYED_RELEASE_TABLET | Freq: Two times a day (BID) | ORAL | Status: DC
  Administered 2019-09-23 – 2019-09-25 (×5): 325 mg via ORAL
  Filled 2019-09-22 (×5): qty 1

## 2019-09-22 MED ORDER — PHENYLEPHRINE 40 MCG/ML (10ML) SYRINGE FOR IV PUSH (FOR BLOOD PRESSURE SUPPORT)
PREFILLED_SYRINGE | INTRAVENOUS | Status: DC | PRN
  Administered 2019-09-22 (×5): 80 ug via INTRAVENOUS

## 2019-09-22 MED ORDER — DEXAMETHASONE SODIUM PHOSPHATE 10 MG/ML IJ SOLN
INTRAMUSCULAR | Status: AC
  Filled 2019-09-22: qty 1

## 2019-09-22 MED ORDER — METOCLOPRAMIDE HCL 5 MG PO TABS: 5.0000 mg | ORAL_TABLET | Freq: Three times a day (TID) | ORAL | Status: DC | PRN

## 2019-09-22 MED ORDER — MAGNESIUM CITRATE PO SOLN: 1.0000 | Freq: Once | ORAL | Status: DC | PRN

## 2019-09-22 MED ORDER — ACETAMINOPHEN 10 MG/ML IV SOLN
1000.0000 mg | Freq: Four times a day (QID) | INTRAVENOUS | Status: DC
  Administered 2019-09-22: 1000 mg via INTRAVENOUS
  Filled 2019-09-22: qty 100

## 2019-09-22 MED ORDER — GABAPENTIN 100 MG PO CAPS
100.0000 mg | ORAL_CAPSULE | Freq: Two times a day (BID) | ORAL | Status: DC
  Administered 2019-09-22 – 2019-09-25 (×6): 100 mg via ORAL
  Filled 2019-09-22 (×6): qty 1

## 2019-09-22 MED ORDER — HYDROCODONE-ACETAMINOPHEN 7.5-325 MG PO TABS
1.0000 | ORAL_TABLET | ORAL | Status: DC | PRN
  Administered 2019-09-23 – 2019-09-24 (×5): 2 via ORAL
  Filled 2019-09-22 (×5): qty 2

## 2019-09-22 MED ORDER — EPHEDRINE 5 MG/ML INJ
INTRAVENOUS | Status: AC
  Filled 2019-09-22: qty 10

## 2019-09-22 MED ORDER — PROPOFOL 10 MG/ML IV BOLUS
INTRAVENOUS | Status: DC | PRN
  Administered 2019-09-22: 120 mg via INTRAVENOUS

## 2019-09-22 MED ORDER — PROMETHAZINE HCL 25 MG/ML IJ SOLN: 6.2500 mg | INTRAMUSCULAR | Status: DC | PRN

## 2019-09-22 MED ORDER — MIRABEGRON ER 25 MG PO TB24
25.0000 mg | ORAL_TABLET | Freq: Every day | ORAL | Status: DC
  Administered 2019-09-22 – 2019-09-25 (×4): 25 mg via ORAL
  Filled 2019-09-22 (×4): qty 1

## 2019-09-22 MED ORDER — METHOCARBAMOL 500 MG PO TABS
500.0000 mg | ORAL_TABLET | Freq: Four times a day (QID) | ORAL | Status: DC | PRN
  Administered 2019-09-23 – 2019-09-25 (×3): 500 mg via ORAL
  Filled 2019-09-22 (×3): qty 1

## 2019-09-22 MED ORDER — ONDANSETRON HCL 4 MG/2ML IJ SOLN
INTRAMUSCULAR | Status: AC
  Filled 2019-09-22: qty 2

## 2019-09-22 MED ORDER — ALBUMIN HUMAN 5 % IV SOLN: INTRAVENOUS | Status: DC | PRN

## 2019-09-22 MED ORDER — BISACODYL 10 MG RE SUPP: 10.0000 mg | Freq: Every day | RECTAL | Status: DC | PRN

## 2019-09-22 MED ORDER — ROCURONIUM BROMIDE 10 MG/ML (PF) SYRINGE
PREFILLED_SYRINGE | INTRAVENOUS | Status: DC | PRN
  Administered 2019-09-22: 60 mg via INTRAVENOUS

## 2019-09-22 MED ORDER — LACTATED RINGERS IV SOLN: INTRAVENOUS | Status: DC | PRN

## 2019-09-22 MED ORDER — FENTANYL CITRATE (PF) 100 MCG/2ML IJ SOLN
INTRAMUSCULAR | Status: DC | PRN
  Administered 2019-09-22: 100 ug via INTRAVENOUS
  Administered 2019-09-22 (×2): 50 ug via INTRAVENOUS

## 2019-09-22 MED ORDER — PANTOPRAZOLE SODIUM 40 MG PO TBEC
40.0000 mg | DELAYED_RELEASE_TABLET | Freq: Every day | ORAL | Status: DC
  Administered 2019-09-22 – 2019-09-25 (×4): 40 mg via ORAL
  Filled 2019-09-22 (×4): qty 1

## 2019-09-22 MED ORDER — ATORVASTATIN CALCIUM 40 MG PO TABS
40.0000 mg | ORAL_TABLET | Freq: Every evening | ORAL | Status: DC
  Administered 2019-09-22 – 2019-09-24 (×3): 40 mg via ORAL
  Filled 2019-09-22 (×3): qty 1

## 2019-09-22 MED ORDER — LEVOTHYROXINE SODIUM 25 MCG PO TABS
25.0000 ug | ORAL_TABLET | Freq: Every day | ORAL | Status: DC
  Administered 2019-09-23 – 2019-09-25 (×3): 25 ug via ORAL
  Filled 2019-09-22 (×3): qty 1

## 2019-09-22 SURGICAL SUPPLY — 69 items
BAG SPEC THK2 15X12 ZIP CLS (MISCELLANEOUS) ×1
BAG ZIPLOCK 12X15 (MISCELLANEOUS) ×3 IMPLANT
BIT DRILL 2.8X128 (BIT) ×2 IMPLANT
BIT DRILL 2.8X128MM (BIT) ×1
BLADE EXTENDED COATED 6.5IN (ELECTRODE) ×3 IMPLANT
CLOSURE WOUND 1/2 X4 (GAUZE/BANDAGES/DRESSINGS) ×2
COVER SURGICAL LIGHT HANDLE (MISCELLANEOUS) ×3 IMPLANT
COVER WAND RF STERILE (DRAPES) IMPLANT
DRAPE INCISE IOBAN 66X45 STRL (DRAPES) ×3 IMPLANT
DRAPE ORTHO SPLIT 77X108 STRL (DRAPES) ×6
DRAPE POUCH INSTRU U-SHP 10X18 (DRAPES) ×3 IMPLANT
DRAPE SURG ORHT 6 SPLT 77X108 (DRAPES) ×2 IMPLANT
DRAPE U-SHAPE 47X51 STRL (DRAPES) ×3 IMPLANT
DRSG CURAD 3X16 NADH (PACKING) ×2 IMPLANT
DRSG EMULSION OIL 3X16 NADH (GAUZE/BANDAGES/DRESSINGS) ×3 IMPLANT
DRSG MEPILEX BORDER 4X12 (GAUZE/BANDAGES/DRESSINGS) ×2 IMPLANT
DRSG MEPILEX BORDER 4X4 (GAUZE/BANDAGES/DRESSINGS) ×3 IMPLANT
DRSG MEPILEX BORDER 4X8 (GAUZE/BANDAGES/DRESSINGS) ×3 IMPLANT
DURAPREP 26ML APPLICATOR (WOUND CARE) ×3 IMPLANT
ELECT REM PT RETURN 15FT ADLT (MISCELLANEOUS) ×3 IMPLANT
EVACUATOR 1/8 PVC DRAIN (DRAIN) ×2 IMPLANT
FACESHIELD WRAPAROUND (MASK) ×12 IMPLANT
FACESHIELD WRAPAROUND OR TEAM (MASK) ×4 IMPLANT
GAUZE SPONGE 4X4 12PLY STRL (GAUZE/BANDAGES/DRESSINGS) ×3 IMPLANT
GLOVE BIO SURGEON STRL SZ7.5 (GLOVE) ×3 IMPLANT
GLOVE BIO SURGEON STRL SZ8 (GLOVE) ×3 IMPLANT
GLOVE BIOGEL PI IND STRL 7.0 (GLOVE) ×1 IMPLANT
GLOVE BIOGEL PI IND STRL 8 (GLOVE) ×1 IMPLANT
GLOVE BIOGEL PI INDICATOR 7.0 (GLOVE) ×2
GLOVE BIOGEL PI INDICATOR 8 (GLOVE) ×2
GLOVE SURG SS PI 7.0 STRL IVOR (GLOVE) ×3 IMPLANT
GOWN STRL REUS W/TWL LRG LVL3 (GOWN DISPOSABLE) ×9 IMPLANT
HANDPIECE INTERPULSE COAX TIP (DISPOSABLE) ×3
HEAD SROM 36MM PLUS9 (Hips) IMPLANT
IMMOBILIZER KNEE 20 (SOFTGOODS)
IMMOBILIZER KNEE 20 THIGH 36 (SOFTGOODS) IMPLANT
KIT BASIN OR (CUSTOM PROCEDURE TRAY) ×3 IMPLANT
KIT TURNOVER KIT A (KITS) IMPLANT
LINER MARATHON NEUT +4X52X36 (Hips) ×2 IMPLANT
MANIFOLD NEPTUNE II (INSTRUMENTS) ×3 IMPLANT
NDL SAFETY ECLIPSE 18X1.5 (NEEDLE) ×1 IMPLANT
NEEDLE HYPO 18GX1.5 SHARP (NEEDLE) ×3
NS IRRIG 1000ML POUR BTL (IV SOLUTION) ×6 IMPLANT
PACK TOTAL JOINT (CUSTOM PROCEDURE TRAY) ×3 IMPLANT
PADDING CAST COTTON 6X4 STRL (CAST SUPPLIES) ×6 IMPLANT
PASSER SUT SWANSON 36MM LOOP (INSTRUMENTS) ×3 IMPLANT
PENCIL SMOKE EVACUATOR (MISCELLANEOUS) IMPLANT
PROTECTOR NERVE ULNAR (MISCELLANEOUS) ×3 IMPLANT
SET HNDPC FAN SPRY TIP SCT (DISPOSABLE) IMPLANT
SROM HEAD 36MM PLUS9 (Hips) ×3 IMPLANT
STAPLER VISISTAT 35W (STAPLE) ×2 IMPLANT
STRIP CLOSURE SKIN 1/2X4 (GAUZE/BANDAGES/DRESSINGS) ×4 IMPLANT
SUCTION FRAZIER HANDLE 10FR (MISCELLANEOUS) ×3
SUCTION TUBE FRAZIER 10FR DISP (MISCELLANEOUS) ×1 IMPLANT
SUT ETHIBOND NAB CT1 #1 30IN (SUTURE) ×6 IMPLANT
SUT MNCRL AB 4-0 PS2 18 (SUTURE) ×3 IMPLANT
SUT STRATAFIX PDS+ 0 24IN (SUTURE) ×4 IMPLANT
SUT VIC AB 1 CT1 27 (SUTURE) ×9
SUT VIC AB 1 CT1 27XBRD ANTBC (SUTURE) ×3 IMPLANT
SUT VIC AB 2-0 CT1 27 (SUTURE) ×9
SUT VIC AB 2-0 CT1 TAPERPNT 27 (SUTURE) ×3 IMPLANT
SUT VLOC 180 0 24IN GS25 (SUTURE) ×3 IMPLANT
SWAB COLLECTION DEVICE MRSA (MISCELLANEOUS) ×3 IMPLANT
SWAB CULTURE ESWAB REG 1ML (MISCELLANEOUS) ×3 IMPLANT
SYR 20ML LL LF (SYRINGE) ×3 IMPLANT
SYR 50ML LL SCALE MARK (SYRINGE) ×3 IMPLANT
TOWEL OR 17X26 10 PK STRL BLUE (TOWEL DISPOSABLE) ×6 IMPLANT
TRAY FOLEY MTR SLVR 16FR STAT (SET/KITS/TRAYS/PACK) ×3 IMPLANT
WATER STERILE IRR 1000ML POUR (IV SOLUTION) ×3 IMPLANT

## 2019-09-22 NOTE — Op Note (Signed)
Elizabeth Shepard, HAM MEDICAL RECORD [id number] ACCOUNT [id number] DATE OF BIRTH:09/17/1940 FACILITY: [facility] LOCATION: [facility] PHYSICIAN:Kirstan Fentress Zella Ball, MD  OPERATIVE REPORT  DATE OF PROCEDURE:  09/22/2019  PREOPERATIVE DIAGNOSIS:  Infected right total hip arthroplasty.  POSTOPERATIVE DIAGNOSIS:  Infected right total hip arthroplasty.  PROCEDURE:  Right hip irrigation and debridement with bearing surface exchange.  SURGEON:  Gaynelle Arabian, MD  ASSISTANT:  Griffith Citron, PA-C  ANESTHESIA:  General.  ESTIMATED BLOOD LOSS:  200 mL.  DRAINS:  Hemovac x1.  COMPLICATIONS:  None.  CONDITION:  Stable to recovery.  BRIEF CLINICAL NOTE:  The patient is a 79 year old female who had a right total hip arthroplasty done nearly 15 years ago.  She has not been having any issues and then spontaneously presented to the office with drainage from her previous hip incision.   She was not having pain with this, nor fever, chills or constitutional symptoms.  We aspirated this and fluid analysis showed that there was a staph species present.  She presents now for irrigation, debridement and liner exchange.  We discussed that a  2-stage revision would perhaps be a more predictable means of eliminating infection, but given the morbidity associated with that, she elected to try the irrigation and debridement and bearing surface exchange.  She presents today for that procedure.  DESCRIPTION IN DETAIL:  After successful administration of general anesthetic, the patient was placed in the left lateral decubitus position with the right side up and held with a hip positioner.  Right lower extremity was isolated from her perineum with  plastic drapes and prepped and draped in the usual sterile fashion.  Posterolateral incision was made with a 10 blade through the subcutaneous tissue to the fascia lata, which was incised in line with the skin incision.  She did have a sinus going from  the joint to the skin.   I excised that sinus and debrided back to normal tissue.  I incised the fascia and there was some purulent fluid present in the joint.  There was a fair amount of inflamed, hypertrophic tissue present.  I palpated and protected  the sciatic nerve and then removed this tissue back to normal-appearing tissue.  We then inspected the joint and then dislocated the hip and removed the femoral head.  There was no damage to the femoral head nor to the trunnion of the femoral neck.  The  prosthesis was in excellent position and was well fixed.  I then translated the femur anteriorly to gain acetabular exposure.  I removed the metal liner from the Pinnacle acetabular shell.  This was a 52 mm shell and a metal liner was removed.  The shell was in good position and was well fixed.  I thoroughly irrigated the joint with 3 L of saline using pulsatile lavage.  I also  placed IrriSept detergent into the wound to further cleanse it.  We then washed that out and replaced the metal liner with a 36 mm neutral +4 Marathon liner for the 52 cup.  This was impacted into position.  The femoral head was a 36+6 and I went to a  36+9 metal head.  This was placed on the femoral neck and the hip was reduced with outstanding stability.  There was full extension, full external rotation, 70 degrees flexion, 40 degrees adduction, 90 degrees internal rotation, 90 degrees of flexion and  70 degrees of internal rotation.  The hip was stable throughout this amount of motion.  By placing the right  leg on the left, leg lengths were felt to be equal.  Further irrigation was performed.  There was not enough capsular tissue to try and repair  the posterior capsule as it was already gone.  The fascial layer was closed with a running 0 Stratafix suture over a Hemovac drain.  Subcutaneous deep was closed with another running Stratafix suture.  Superficial subcutaneous closed with interrupted 2-0  Vicryl and skin with staples.  The incision was cleaned  and dried and a bulky sterile dressing applied.  She was then awakened and transported to recovery in stable condition.  VN/NUANCE  D:09/22/2019 T:09/22/2019 JOB:[id number]

## 2019-09-22 NOTE — Transfer of Care (Signed)
Immediate Anesthesia Transfer of Care Note  Patient: Elizabeth Shepard  Procedure(s) Performed: IRRIGATION AND DEBRIDEMENT RIGHT HIP; BEARING SURFACE REVISION (Right Hip)  Patient Location: PACU  Anesthesia Type:General  Level of Consciousness: drowsy, patient cooperative and responds to stimulation  Airway & Oxygen Therapy: Patient Spontanous Breathing and Patient connected to face mask oxygen  Post-op Assessment: Report given to RN and Post -op Vital signs reviewed and stable  Post vital signs: Reviewed and stable  Last Vitals:  Vitals Value Taken Time  BP 147/64 09/22/19 1655  Temp    Pulse 87 09/22/19 1655  Resp 20 09/22/19 1655  SpO2 100 % 09/22/19 1655  Vitals shown include unvalidated device data.  Last Pain:  Vitals:   09/22/19 1301  TempSrc: Oral  PainSc: 0-No pain         Complications: No apparent anesthesia complications

## 2019-09-22 NOTE — Anesthesia Preprocedure Evaluation (Addendum)
Anesthesia Evaluation  Patient identified by MRN, date of birth, ID band Patient awake    Reviewed: Allergy & Precautions, NPO status , Patient's Chart, lab work & pertinent test results  History of Anesthesia Complications Negative for: history of anesthetic complications  Airway Mallampati: II  TM Distance: >3 FB Neck ROM: Full    Dental  (+) Chipped, Dental Advisory Given   Pulmonary neg pulmonary ROS,  09/22/2019 SARS coronavirus NEG   breath sounds clear to auscultation       Cardiovascular hypertension, Pt. on medications (-) angina Rhythm:Regular Rate:Normal  '20 ECHO: EF 60-65%, valves OK   Neuro/Psych Anxiety Depression negative neurological ROS     GI/Hepatic GERD  Medicated and Controlled,  Endo/Other  Hypothyroidism   Renal/GU negative Renal ROS     Musculoskeletal  (+) Arthritis ,   Abdominal   Peds  Hematology negative hematology ROS (+)   Anesthesia Other Findings   Reproductive/Obstetrics                            Anesthesia Physical Anesthesia Plan  ASA: II  Anesthesia Plan: General   Post-op Pain Management:    Induction: Intravenous  PONV Risk Score and Plan: 3 and Ondansetron, Dexamethasone and Treatment may vary due to age or medical condition  Airway Management Planned: Oral ETT  Additional Equipment:   Intra-op Plan:   Post-operative Plan: Extubation in OR  Informed Consent: I have reviewed the patients History and Physical, chart, labs and discussed the procedure including the risks, benefits and alternatives for the proposed anesthesia with the patient or authorized representative who has indicated his/her understanding and acceptance.     Dental advisory given  Plan Discussed with: CRNA and Surgeon  Anesthesia Plan Comments: (Pt very nervous, to be positioned laterally, GA with prior surgery: plan routine monitors, GETA)         Anesthesia Quick Evaluation

## 2019-09-22 NOTE — Interval H&P Note (Signed)
History and Physical Interval Note:  09/22/2019 2:11 PM  Elizabeth Shepard  has presented today for surgery, with the diagnosis of Infected right total hip arthroplasty.  The various methods of treatment have been discussed with the patient and family. After consideration of risks, benefits and other options for treatment, the patient has consented to  Procedure(s) with comments: IRRIGATION AND DEBRIDEMENT RIGHT HIP; BEARING SURFACE REVISION (Right) - as a surgical intervention.  The patient's history has been reviewed, patient examined, no change in status, stable for surgery.  I have reviewed the patient's chart and labs.  Questions were answered to the patient's satisfaction.     Homero Fellers Brittani Purdum

## 2019-09-22 NOTE — Anesthesia Postprocedure Evaluation (Signed)
Anesthesia Post Note  Patient: Elizabeth Shepard  Procedure(s) Performed: IRRIGATION AND DEBRIDEMENT RIGHT HIP; BEARING SURFACE REVISION (Right Hip)     Patient location during evaluation: PACU Anesthesia Type: General Level of consciousness: awake and alert, oriented and patient cooperative Pain management: pain level controlled Vital Signs Assessment: post-procedure vital signs reviewed and stable Respiratory status: spontaneous breathing, nonlabored ventilation and respiratory function stable Cardiovascular status: blood pressure returned to baseline and stable Postop Assessment: no apparent nausea or vomiting Anesthetic complications: no    Last Vitals:  Vitals:   09/22/19 1715 09/22/19 1730  BP: (!) 152/78 (!) 144/73  Pulse: 75 80  Resp: 17 13  Temp:    SpO2: 100% 94%    Last Pain:  Vitals:   09/22/19 1715  TempSrc:   PainSc: 9                  Shenaya Lebo,E. Aileana Hodder

## 2019-09-22 NOTE — Plan of Care (Signed)

## 2019-09-22 NOTE — Discharge Instructions (Addendum)
Dr. Ollen Gross Total Joint Specialist [facility] [address], [address] [place], [place] [address] [phone]   POSTOPERATIVE DIRECTIONS  Hip Rehabilitation, Guidelines Following Surgery  The results of a hip operation are greatly improved after range of motion and muscle strengthening exercises. Follow all safety measures which are given to protect your hip. If any of these exercises cause increased pain or swelling in your joint, decrease the amount until you are comfortable again. Then slowly increase the exercises. Call your caregiver if you have problems or questions.   BLOOD CLOT PREVENTION . Take a 325 mg Aspirin two times a day for three weeks following surgery. Then take an 81 mg Aspirin once a day for three weeks. Then discontinue Aspirin. Elizabeth Shepard may resume your vitamins/supplements upon discharge from the hospital. . Do not take any NSAIDs (Advil, Aleve, Ibuprofen, Meloxicam, etc.) until you have discontinued the 325 mg Aspirin.  PRECAUTIONS (6 WEEKS FOLLOWING SURGERY) . Do not bend your hip past a 90 degree angle . Do not cross your legs. . Don't twist your hip inwards- keep knees and toes pointed upwards   HOME CARE INSTRUCTIONS  . Remove items at home which could result in a fall. This includes throw rugs or furniture in walking pathways.   ICE to the affected hip every three hours for 30 minutes at a time and then as needed for pain and swelling.  Continue to use ice on the hip for pain and swelling from surgery. You may notice swelling that will progress down to the foot and ankle.  This is normal after surgery.  Elevate the leg when you are not up walking on it.    Continue to use the breathing machine which will help keep your temperature down.  It is common for your temperature to cycle up and down following surgery, especially at night when you are not up moving around and exerting yourself.  The breathing machine keeps your lungs expanded and your  temperature down.  DIET You may resume your previous home diet once your are discharged from the hospital.  DRESSING / WOUND CARE / SHOWERING You may change your dressing 3-5 days after surgery.  Then change the dressing every day with sterile gauze.  Please use good hand washing techniques before changing the dressing.  Do not use any lotions or creams on the incision until instructed by your surgeon. You may start showering once you are discharged home but do not submerge the incision under water. Just pat the incision dry and apply a dry gauze dressing on daily. Change the surgical dressing daily and reapply a dry dressing each time.  ACTIVITY Walk with your walker as instructed. Use walker as long as suggested by your caregivers. Avoid periods of inactivity such as sitting longer than an hour when not asleep. This helps prevent blood clots.  You may resume a sexual relationship in one month or when given the OK by your doctor.  You may return to work once you are cleared by your doctor.  Do not drive a car for 6 weeks or until released by you surgeon.  Do not drive while taking narcotics.  WEIGHT BEARING Weight bearing as tolerated with assist device (walker, cane, etc) as directed, use it as long as suggested by your surgeon or therapist, typically at least 4-6 weeks.  POSTOPERATIVE CONSTIPATION PROTOCOL Constipation - defined medically as fewer than three stools per week and severe constipation as less than one stool per week.  One of the most common issues patients have following surgery is constipation.  Even if you have a regular bowel pattern at home, your normal regimen is likely to be disrupted due to multiple reasons following surgery.  Combination of anesthesia, postoperative narcotics, change in appetite and fluid intake all can affect your bowels.  In order to avoid complications following surgery, here are some recommendations in order to help you during your recovery  period.  Colace (docusate) - Pick up an over-the-counter form of Colace or another stool softener and take twice a day as long as you are requiring postoperative pain medications.  Take with a full glass of water daily.  If you experience loose stools or diarrhea, hold the colace until you stool forms back up.  If your symptoms do not get better within 1 week or if they get worse, check with your doctor.  Dulcolax (bisacodyl) - Pick up over-the-counter and take as directed by the product packaging as needed to assist with the movement of your bowels.  Take with a full glass of water.  Use this product as needed if not relieved by Colace only.   MiraLax (polyethylene glycol) - Pick up over-the-counter to have on hand.  MiraLax is a solution that will increase the amount of water in your bowels to assist with bowel movements.  Take as directed and can mix with a glass of water, juice, soda, coffee, or tea.  Take if you go more than two days without a movement. Do not use MiraLax more than once per day. Call your doctor if you are still constipated or irregular after using this medication for 7 days in a row.  If you continue to have problems with postoperative constipation, please contact the office for further assistance and recommendations.  If you experience "the worst abdominal pain ever" or develop nausea or vomiting, please contact the office immediatly for further recommendations for treatment.  ITCHING  If you experience itching with your medications, try taking only a single pain pill, or even half a pain pill at a time.  You can also use Benadryl over the counter for itching or also to help with sleep.   TED HOSE STOCKINGS Wear the elastic stockings on both legs for three weeks following surgery during the day but you may remove then at night for sleeping.  MEDICATIONS See your medication summary on the "After Visit Summary" that the nursing staff will review with you prior to discharge.   You may have some home medications which will be placed on hold until you complete the course of blood thinner medication.  It is important for you to complete the blood thinner medication as prescribed by your surgeon.  Continue your approved medications as instructed at time of discharge.  PRECAUTIONS If you experience chest pain or shortness of breath - call 911 immediately for transfer to the hospital emergency department.  If you develop a fever greater that 101 F, purulent drainage from wound, increased redness or drainage from wound, foul odor from the wound/dressing, or calf pain - CONTACT YOUR SURGEON.                                                   FOLLOW-UP APPOINTMENTS Make sure you keep all of your appointments after your operation with your surgeon and caregivers. You should call  the office at the above phone number and make an appointment for approximately two weeks after the date of your surgery or on the date instructed by your surgeon outlined in the "After Visit Summary".  RANGE OF MOTION AND STRENGTHENING EXERCISES  These exercises are designed to help you keep full movement of your hip joint. Follow your caregiver's or physical therapist's instructions. Perform all exercises about fifteen times, three times per day or as directed. Exercise both hips, even if you have had only one joint replacement. These exercises can be done on a training (exercise) mat, on the floor, on a table or on a bed. Use whatever works the best and is most comfortable for you. Use music or television while you are exercising so that the exercises are a pleasant break in your day. This will make your life better with the exercises acting as a break in routine you can look forward to.  . Lying on your back, slowly slide your foot toward your buttocks, raising your knee up off the floor. Then slowly slide your foot back down until your leg is straight again.  . Lying on your back spread your legs as far apart  as you can without causing discomfort.  . Lying on your side, raise your upper leg and foot straight up from the floor as far as is comfortable. Slowly lower the leg and repeat.  . Lying on your back, tighten up the muscle in the front of your thigh (quadriceps muscles). You can do this by keeping your leg straight and trying to raise your heel off the floor. This helps strengthen the largest muscle supporting your knee.  . Lying on your back, tighten up the muscles of your buttocks both with the legs straight and with the knee bent at a comfortable angle while keeping your heel on the floor.   IF YOU ARE TRANSFERRED TO A SKILLED REHAB FACILITY If the patient is transferred to a skilled rehab facility following release from the hospital, a list of the current medications will be sent to the facility for the patient to continue.  When discharged from the skilled rehab facility, please have the facility set up the patient's Home Health Physical Therapy prior to being released. Also, the skilled facility will be responsible for providing the patient with their medications at time of release from the facility to include their pain medication, the muscle relaxants, and their blood thinner medication. If the patient is still at the rehab facility at time of the two week follow up appointment, the skilled rehab facility will also need to assist the patient in arranging follow up appointment in our office and any transportation needs.  MAKE SURE YOU:  . Understand these instructions.  . Get help right away if you are not doing well or get worse.    Pick up stool softner and laxative for home use following surgery while on pain medications. Do not submerge incision under water. Please use good hand washing techniques while changing dressing each day. May shower starting three days after surgery. Please use a clean towel to pat the incision dry following showers. Continue to use ice for pain and swelling  after surgery. Do not use any lotions or creams on the incision until instructed by your surgeon.

## 2019-09-22 NOTE — Brief Op Note (Signed)
09/22/2019  4:24 PM  PATIENT:  Elizabeth Shepard  79 y.o. female  PRE-OPERATIVE DIAGNOSIS:  Infected right total hip arthroplasty  POST-OPERATIVE DIAGNOSIS:  Infected right total hip arthroplasty  PROCEDURE:  Procedure(s) with comments: IRRIGATION AND DEBRIDEMENT RIGHT HIP; BEARING SURFACE REVISION (Right) -  SURGEON:  Surgeon(s) and Role:    Ollen Gross, MD - Primary  PHYSICIAN ASSISTANT:   ASSISTANTS: Dennie Bible, PA-C   ANESTHESIA:   general  EBL:  200 mL   BLOOD ADMINISTERED:none  DRAINS: (Medium) Hemovact drain(s) in the right hip with  Suction Open   LOCAL MEDICATIONS USED:  NONE  COUNTS:  YES  TOURNIQUET:  * No tourniquets in log *  DICTATION: .Other Dictation: Dictation Number [id number]  PLAN OF CARE: Admit to inpatient   PATIENT DISPOSITION:  PACU - hemodynamically stable.

## 2019-09-22 NOTE — H&P (Signed)
Elizabeth Shepard is an 79 y.o. female.   Chief Complaint: Right hip draining sinus HPI: 79 yo female s/p right Total; Hip Arthroplasty approximately 2007 who had no issues and spontaneously developed drainage from the right hip approximately 10 days ago. She did not have any hip pain or associated systemic symptoms such as fever, chills or night sweats.She presented to the office with a draining sinus from the lateral hip without surrounding erythema. Aspiration was performed in the office and fluid had a high WBC count and rare staph species once final culture was obtained on 4/5. She has a metal on metal hip and it was felt that this possibly developed from reaction to metal debris.We discussed treatment options including 2 stage revision vs. Irrigation and debridement with bearing exchange. We discussed the pros and cons of both and she elects to proceed with irrigation and debridement and bearing exchange realizing that the likelihood of eradicating the infection is less than a 2-stage revision  Past Medical History:  Diagnosis Date  . Anxiety   . Depression   . Generalized pain   . Hypercholesteremia   . Hypertension   . Hypothyroidism   . Thyroid disease   . UTI (lower urinary tract infection)     Past Surgical History:  Procedure Laterality Date  . AUGMENTATION MAMMAPLASTY Bilateral 1980  . BREAST BIOPSY Right 1970   neg  . COLONOSCOPY WITH PROPOFOL N/A 09/12/2015   Procedure: COLONOSCOPY WITH PROPOFOL;  Surgeon: Lollie Sails, MD;  Location: [facility] ENDOSCOPY;  Service: Endoscopy;  Laterality: N/A;  . ESOPHAGOGASTRODUODENOSCOPY N/A 07/06/2015   Procedure: ESOPHAGOGASTRODUODENOSCOPY (EGD);  Surgeon: Lollie Sails, MD;  Location[facility] ENDOSCOPY;  Service: Endoscopy;  Laterality: N/A;  . ESOPHAGOGASTRODUODENOSCOPY (EGD) WITH PROPOFOL N/A 09/12/2015   Procedure: ESOPHAGOGASTRODUODENOSCOPY (EGD) WITH PROPOFOL;  Surgeon: Lollie Sails, MD;  Location: [facility] ENDOSCOPY;  Service: Endoscopy;   Laterality: N/A;  . HIP SURGERY    . Mastoid surgery    . TONSILLECTOMY    . TOTAL HIP ARTHROPLASTY Bilateral     Family History  Problem Relation Age of Onset  . CAD Mother   . Diabetes Mother   . CAD Father   . Diabetes Father   . Bladder Cancer Neg Hx   . Prostate cancer Neg Hx   . Kidney cancer Neg Hx    Social History:  reports that she has never smoked. She has never used smokeless tobacco. She reports that she does not drink alcohol or use drugs.  Allergies:  Allergies  Allergen Reactions  . Ciprofloxacin Itching and Rash    No medications prior to admission.    No results found for this or any previous visit (from the past 48 hour(s)). No results found.  Review of Systems  There were no vitals taken for this visit. Physical Exam Physical Examination: General appearance - alert, well appearing, and in no distress Mental status - alert, oriented to person, place, and time Chest - clear to auscultation, no wheezes, rales or rhonchi, symmetric air entry Heart - normal rate, regular rhythm, normal S1, S2, no murmurs, rubs, clicks or gallops Abdomen - soft, nontender, nondistended, no masses or organomegaly Neurological - alert, oriented, normal speech, no focal findings or movement disorder noted Right hip- no pain on range of motion. Has small area at mid aspect of previous hip incision that is draining cloudy fluid. No surrounding erythema     Assessment/Plan Right hip draining sinus- This is a very unusual presentation. Will plan  on irrigation and debridement with bearing exchange today. Discussed in detail with patient who elects to proceed  Ollen Gross, MD 09/22/2019, 7:17 AM

## 2019-09-22 NOTE — Anesthesia Procedure Notes (Signed)
Procedure Name: Intubation Date/Time: 09/22/2019 3:12 PM Performed by: Florene Route, CRNA Patient Re-evaluated:Patient Re-evaluated prior to induction Oxygen Delivery Method: Circle system utilized Preoxygenation: Pre-oxygenation with 100% oxygen Induction Type: IV induction Ventilation: Mask ventilation without difficulty and Oral airway inserted - appropriate to patient size Laryngoscope Size: Hyacinth Meeker and 2 Grade View: Grade I Tube type: Oral Tube size: 7.5 mm Number of attempts: 1 Airway Equipment and Method: Stylet Placement Confirmation: ETT inserted through vocal cords under direct vision,  positive ETCO2 and breath sounds checked- equal and bilateral Secured at: 21 cm Tube secured with: Tape Dental Injury: Teeth and Oropharynx as per pre-operative assessment

## 2019-09-23 ENCOUNTER — Inpatient Hospital Stay: Payer: Self-pay

## 2019-09-23 DIAGNOSIS — E785 Hyperlipidemia, unspecified: Secondary | ICD-10-CM

## 2019-09-23 DIAGNOSIS — M00051 Staphylococcal arthritis, right hip: Secondary | ICD-10-CM

## 2019-09-23 DIAGNOSIS — Z881 Allergy status to other antibiotic agents status: Secondary | ICD-10-CM

## 2019-09-23 DIAGNOSIS — T8451XA Infection and inflammatory reaction due to internal right hip prosthesis, initial encounter: Principal | ICD-10-CM

## 2019-09-23 DIAGNOSIS — Z96641 Presence of right artificial hip joint: Secondary | ICD-10-CM

## 2019-09-23 DIAGNOSIS — B9561 Methicillin susceptible Staphylococcus aureus infection as the cause of diseases classified elsewhere: Secondary | ICD-10-CM

## 2019-09-23 LAB — BASIC METABOLIC PANEL
Anion gap: 8 (ref 5–15)
BUN: 18 mg/dL (ref 8–23)
CO2: 26 mmol/L (ref 22–32)
Calcium: 8.5 mg/dL — ABNORMAL LOW (ref 8.9–10.3)
Chloride: 105 mmol/L (ref 98–111)
Creatinine, Ser: 0.97 mg/dL (ref 0.44–1.00)
GFR calc Af Amer: >60 mL/min (ref >60)
GFR calc non Af Amer: 56 mL/min — ABNORMAL LOW (ref >60)
Glucose, Bld: 156 mg/dL — ABNORMAL HIGH (ref 70–99)
Potassium: 4.9 mmol/L (ref 3.5–5.1)
Sodium: 139 mmol/L (ref 135–145)

## 2019-09-23 LAB — CBC
HCT: 29 % — ABNORMAL LOW (ref 36.0–46.0)
Hemoglobin: 8.9 g/dL — ABNORMAL LOW (ref 12.0–15.0)
MCH: 28.1 pg (ref 26.0–34.0)
MCHC: 30.7 g/dL (ref 30.0–36.0)
MCV: 91.5 fL (ref 80.0–100.0)
Platelets: 354 10*3/uL (ref 150–400)
RBC: 3.17 MIL/uL — ABNORMAL LOW (ref 3.87–5.11)
RDW: 14.4 % (ref 11.5–15.5)
WBC: 5.8 10*3/uL (ref 4.0–10.5)
nRBC: 0 % (ref 0.0–0.2)

## 2019-09-23 MED ORDER — RIFAMPIN 300 MG PO CAPS
300.0000 mg | ORAL_CAPSULE | Freq: Two times a day (BID) | ORAL | Status: DC
  Administered 2019-09-23 – 2019-09-25 (×4): 300 mg via ORAL
  Filled 2019-09-23 (×5): qty 1

## 2019-09-23 MED ORDER — SODIUM CHLORIDE 0.9% FLUSH
10.0000 mL | INTRAVENOUS | Status: DC | PRN
  Administered 2019-09-24 – 2019-09-25 (×2): 10 mL

## 2019-09-23 MED ORDER — SODIUM CHLORIDE 0.9% FLUSH
10.0000 mL | Freq: Two times a day (BID) | INTRAVENOUS | Status: DC
  Administered 2019-09-23: 10 mL
  Administered 2019-09-24: 20 mL

## 2019-09-23 MED ORDER — CEFAZOLIN SODIUM-DEXTROSE 2-4 GM/100ML-% IV SOLN
2.0000 g | Freq: Three times a day (TID) | INTRAVENOUS | Status: DC
  Administered 2019-09-23 – 2019-09-25 (×6): 2 g via INTRAVENOUS
  Filled 2019-09-23 (×5): qty 100

## 2019-09-23 MED ORDER — CHLORHEXIDINE GLUCONATE CLOTH 2 % EX PADS
6.0000 | MEDICATED_PAD | Freq: Every day | CUTANEOUS | Status: DC
  Administered 2019-09-24: 6 via TOPICAL

## 2019-09-23 NOTE — Progress Notes (Addendum)
Physical Therapy Treatment Patient Details Name: Elizabeth Shepard MRN: [id number] DOB: 03-15-1941 Today's Date: 09/23/2019    History of Present Illness Pt s/p R THR I&D with bearing surface exchange.  Pt with hx of bil THR.    PT Comments    Pt admitted as above and presenting with functional mobility limitations 2* decreased R LE strength/ROM, post op pain and posterior THP.  Pt should progress well to dc home with family assist.Kiira Brach Ermalinda Memos PT Acute Rehabilitation Services Pager [phone] Office [phone]   Follow Up Recommendations  Follow surgeon's recommendation for DC plan and follow-up therapies     Equipment Recommendations  None recommended by PT    Recommendations for Other Services OT consult     Precautions / Restrictions Precautions Precautions: Posterior Hip;Fall Precaution Booklet Issued: Yes (comment) Restrictions Weight Bearing Restrictions: No Other Position/Activity Restrictions: WBAT    Mobility  Bed Mobility Overal bed mobility: Needs Assistance Bed Mobility: Supine to Sit     Supine to sit: Min assist     General bed mobility comments: cues for sequence, use of L LE to self assist and adherence to THP  Transfers Overall transfer level: Needs assistance Equipment used: Rolling walker (2 wheeled) Transfers: Sit to/from Stand Sit to Stand: Min assist         General transfer comment: cues for LE management and use of UEs to self assist  Ambulation/Gait Ambulation/Gait assistance: Min assist Gait Distance (Feet): 90 Feet(and 15' into bathroom) Assistive device: Rolling walker (2 wheeled) Gait Pattern/deviations: Step-to pattern;Step-through pattern;Decreased step length - right;Decreased step length - left;Shuffle;Trunk flexed Gait velocity: decr   General Gait Details: cues for sequence, posture, position from RW and adherence to THP   Stairs             Wheelchair Mobility    Modified Rankin (Stroke Patients Only)        Balance Overall balance assessment: Needs assistance Sitting-balance support: No upper extremity supported;Feet supported Sitting balance-Leahy Scale: Good     Standing balance support: Bilateral upper extremity supported Standing balance-Leahy Scale: Poor                              Cognition Arousal/Alertness: Awake/alert Behavior During Therapy: WFL for tasks assessed/performed Overall Cognitive Status: No family/caregiver present to determine baseline cognitive functioning                                 General Comments: slow processing and requiring repetition and explanation      Exercises Total Joint Exercises Ankle Circles/Pumps: AROM;Both;15 reps;Supine    General Comments        Pertinent Vitals/Pain Pain Assessment: 0-10 Pain Score: 3  Pain Location: R hip Pain Descriptors / Indicators: Aching;Sore Pain Intervention(s): Limited activity within patient's tolerance;Monitored during session;Premedicated before session    Home Living Family/patient expects to be discharged to:: Private residence Living Arrangements: Spouse/significant other Available Help at Discharge: Family Type of Home: House Home Access: Stairs to enter Entrance Stairs-Rails: Right;Left;Can reach both Home Layout: One level Home Equipment: Environmental consultant - 4 wheels;Walker - 2 wheels      Prior Function Level of Independence: Independent with assistive device(s)      Comments: Patient reports she uses rollator, does her own bathing and dressing.   PT Goals (current goals can now be found in the care plan section) Acute Rehab PT  Goals Patient Stated Goal: Regain IND PT Goal Formulation: With patient Time For Goal Achievement: 10/07/19 Potential to Achieve Goals: Good    Frequency    7X/week      PT Plan      Co-evaluation              AM-PAC PT "6 Clicks" Mobility   Outcome Measure  Help needed turning from your back to your side while  in a flat bed without using bedrails?: A Lot Help needed moving from lying on your back to sitting on the side of a flat bed without using bedrails?: A Little Help needed moving to and from a bed to a chair (including a wheelchair)?: A Little Help needed standing up from a chair using your arms (e.g., wheelchair or bedside chair)?: A Little Help needed to walk in hospital room?: A Little Help needed climbing 3-5 steps with a railing? : A Little 6 Click Score: 17    End of Session Equipment Utilized During Treatment: Gait belt Activity Tolerance: Patient tolerated treatment well Patient left: in chair;with call bell/phone within reach;with chair alarm set Nurse Communication: Mobility status PT Visit Diagnosis: Difficulty in walking, not elsewhere classified (R26.2)     Time: 1102-1140 PT Time Calculation (min) (ACUTE ONLY): 38 min  Charges:  $Gait Training: 8-22 mins $Therapeutic Activity: 8-22 mins                     Debe Coder PT Acute Rehabilitation Services Pager [phone] Office [phone]    Tavon Corriher 09/23/2019, 12:44 PM

## 2019-09-23 NOTE — Progress Notes (Addendum)
PHARMACY CONSULT NOTE FOR:  OUTPATIENT  PARENTERAL ANTIBIOTIC THERAPY (OPAT)  Indication: Prosthetic hip infection Regimen: Ancef 2gm q8 End date: 11/16/2019  IV antibiotic discharge orders are pended. To discharging provider:  please sign these orders via discharge navigator,  Select New Orders & click on the button choice - Manage This Unsigned Work.     Thank you for allowing pharmacy to be a part of this patient's care.  Otho Bellows PharmD 09/23/2019, 11:03 AM

## 2019-09-23 NOTE — Progress Notes (Addendum)
Right hip synovial fluid gram stain & culture obtained on 09/14/2019   CULTURE, ANAEROBIC BACTERIA W/GRAM STAIN       Micro Number:      [id number]   Test Status:        Final   Specimen Source:    HIP, RIGHT   Specimen Quality: Adequate   Gram Stain:        Many Polymorphonuclear leukocytes                       No organisms seen    Result:            No anaerobes isolated.      CULTURE, AEROBIC BACTERIA       Micro Number:      [id number]   Test Status:            Final   Specimen Source:   HIP, RIGHT   Specimen Quality:    Adequate   Result:                    Light growth of Staphylococcus aureus                       This isolate demonstrates inducible clindamycin resistance.                             S.aureus                            ----------------                             INT   MIC    CIPROFLOXACIN      S     <=0.5    CLINDAMYCIN            R     NR    ERYTHROMYCIN      R     >=8    GENTAMICIN              S     <=0.5    LEVOFLOXACIN         S     0.25    OXACILLIN                  S     0.5 **1    TETRACYCLINE          S     <=1    TRIMETHOPRIM/SULFA     S     <=10    VANCOMYCIN            S     <=0.5

## 2019-09-23 NOTE — TOC Initial Note (Signed)
Transition of Care [facility]) - Initial/Assessment Note    Patient Details  Name: Elizabeth Shepard MRN: [id number] Date of Birth: 12-Jan-1941  Transition of Care [facility]) CM/SW Contact:    Lennart Pall, LCSW Phone Number: 09/23/2019, 4:05 PM  Clinical Narrative:      Met with pt today to review anticipated d/c needs.  Pt agreeable with rec for HHRN, PT and possibly OT.  Pt has had [facility] in the past and would like to use again.  Has all needed DME.  Confirms she has spouse with her at home, however, notes "he has his own health problems.  Can't really help me too much." Pt feels she really needs to be as independent as she can to return home.   Alerted pt that I had already reached out to Carolynn Sayers, RN with [facility] to arrange for home IV abx.  Anticipate she will meet with pt later today to begin teaching.  Plan to follow up with pt in the morning.               Expected Discharge Plan: [place] Barriers to Discharge: Continued Medical Work up   Patient Goals and CMS Choice Patient states their goals for this hospitalization and ongoing recovery are:: "I need to be able to do things for myself." CMS Medicare.gov Compare Post Acute Care list provided to:: Patient Choice offered to / list presented to : Patient  Expected Discharge Plan and Services Expected Discharge Plan: [place] In-house Referral: Clinical Social Work   Post Acute Care Choice: [place] arrangements for the past 2 months: [place]                 DME Arranged: N/A(has needed DME) DME Agency: NA                  Prior Living Arrangements/Services Living arrangements for the past 2 months: [place] Lives with:: Spouse Patient language and need for interpreter reviewed:: Yes Do you feel safe going back to the place where you live?: Yes      Need for Family Participation in Patient Care: No (Comment) Care giver support  system in place?: Yes (comment) Current home services: (pt already has rw and 3n1 commode) Criminal Activity/Legal Involvement Pertinent to Current Situation/Hospitalization: No - Comment as needed  Activities of Daily Living Home Assistive Devices/Equipment: Cane (specify quad or straight), Walker (specify type) ADL Screening (condition at time of admission) Patient's cognitive ability adequate to safely complete daily activities?: Yes Is the patient deaf or have difficulty hearing?: No Does the patient have difficulty seeing, even when wearing glasses/contacts?: No Does the patient have difficulty concentrating, remembering, or making decisions?: No Patient able to express need for assistance with ADLs?: Yes Does the patient have difficulty dressing or bathing?: No Independently performs ADLs?: Yes (appropriate for developmental age) Does the patient have difficulty walking or climbing stairs?: Yes Weakness of Legs: Both Weakness of Arms/Hands: None  Permission Sought/Granted Permission sought to share information with : Family Supports Permission granted to share information with : Yes, Verbal Permission Granted  Share Information with NAME: Ed Dickenson (spouse) and Sabel Hornbeck (son - living in [place])     [place] granted to share info w Relationship: spouse and son  Permission granted to share info w Contact Information: spouse @ [phone];  son @ [phone]  Emotional Assessment Appearance:: Appears older than stated age Attitude/Demeanor/Rapport: Engaged,  Gracious Affect (typically observed): Pleasant Orientation: : Oriented to Self, Oriented to Place, Oriented to  Time, Oriented to Situation Alcohol / Substance Use: Not Applicable Psych Involvement: No (comment)  Admission diagnosis:  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 Septic arthritis of hip ([place]) 09/22/2019  . 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 Near syncope 04/23/2019  . GERD without esophagitis 04/23/2019  . UTI (urinary tract infection) 04/22/2019  . Anterior dislocation of right hip ([place]) 09/11/2017  . Sepsis ([place]) 12/30/2015  . UTI (lower urinary tract infection) 12/29/2015  . Hypothyroidism 12/29/2015  . Weakness 12/29/2015  . Abnormal EKG 12/29/2015  . Allergic rhinitis 09/20/2015  . Clinical depression 09/20/2015  . HLD (hyperlipidemia) 09/20/2015  . Essential hypertension 09/20/2015  . Anemia 07/04/2015  . Acute hemorrhagic cystitis 11/07/2014   PCP:  Dion Body, MD Pharmacy:   [place], [place] - [address] [place] [address] Phone: [phone] Fax: [phone]     Social Determinants of Health (SDOH) Interventions    Readmission Risk Interventions Readmission Risk Prevention Plan 09/23/2019  Post Dischage Appt Complete  Medication Screening Complete  Some recent data might be hidden

## 2019-09-23 NOTE — Progress Notes (Signed)
Awaiting ID recommendations for PICC placement.

## 2019-09-23 NOTE — Progress Notes (Signed)
   Subjective: 1 Day Post-Op Procedure(s) (LRB): IRRIGATION AND DEBRIDEMENT RIGHT HIP; BEARING SURFACE REVISION (Right) Patient reports pain as mild.   Patient seen in rounds with Dr. Lequita Halt. Patient is well, and has had no acute complaints or problems. No issues overnight. Denies chest pain or SOB. Foley catheter to be removed this AM.  We will begin therapy today.   Objective: Vital signs in last 24 hours: Temp:  [97.6 F (36.4 C)-98.3 F (36.8 C)] 97.7 F (36.5 C) (04/08 0530) Pulse Rate:  [68-89] 68 (04/08 0530) Resp:  [12-19] 18 (04/08 0530) BP: (120-152)/(57-78) 126/73 (04/08 0530) SpO2:  [89 %-100 %] 91 % (04/08 0530) Weight:  [79 kg] 79 kg (04/07 1807)  Intake/Output from previous day:  Intake/Output Summary (Last 24 hours) at 09/23/2019 0747 Last data filed at 09/23/2019 2979 Gross per 24 hour  Intake 3804.42 ml  Output 2085 ml  Net 1719.42 ml    Labs: Recent Labs    09/22/19 1244 09/23/19 0327  HGB 11.1* 8.9*   Recent Labs    09/22/19 1244 09/23/19 0327  WBC 8.4 5.8  RBC 4.01 3.17*  HCT 36.2 29.0*  PLT 544* 354   Recent Labs    09/22/19 1244 09/23/19 0327  NA 136 139  K 4.6 4.9  CL 105 105  CO2 19* 26  BUN 23 18  CREATININE 1.03* 0.97  GLUCOSE 117* 156*  CALCIUM 9.3 8.5*   Recent Labs    09/22/19 1244  INR 1.0    Exam: General - Patient is Alert and Oriented Extremity - Neurologically intact Neurovascular intact Sensation intact distally Dorsiflexion/Plantar flexion intact Dressing - dressing C/D/I Motor Function - intact, moving foot and toes well on exam.   Past Medical History:  Diagnosis Date  . Anxiety   . Depression   . Generalized pain   . Hypercholesteremia   . Hypertension   . Hypothyroidism   . Thyroid disease   . 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   Weight as of this encounter: 79 kg. Advance diet Up with therapy  DVT Prophylaxis - Aspirin Weight bearing as tolerated. D/C O2 and pulse ox and try on room air. Hemovac drain left in place, will begin therapy.  Plan is to go Home with HHPT after hospital stay.  PICC line order placed. Will obtain ID consult for prolonged IV antibiotic management.  Intraoperative cultures taken (no organisms seen).   Synovial fluid gram stain/culture results from aspiration at office on 09/14/2019 pasted into previous progress note.   Possible discharge tomorrow if able to coordinate care/plan.  09/16/2019, PA-C Orthopedic Surgery [phone] 09/23/2019, 7:47 AM

## 2019-09-23 NOTE — Progress Notes (Signed)
Physical Therapy Treatment Patient Details Name: Elizabeth Shepard MRN: [id number] DOB: 22-Aug-1940 Today's Date: 09/23/2019    History of Present Illness Pt s/p R THR I&D with bearing surface exchange.  Pt with hx of bil THR.    PT Comments    Pt highly distractible and requiring increased time for all task but is progressing well with mobility.   Follow Up Recommendations  Follow surgeon's recommendation for DC plan and follow-up therapies     Equipment Recommendations  None recommended by PT    Recommendations for Other Services OT consult     Precautions / Restrictions Precautions Precautions: Posterior Hip;Fall Precaution Booklet Issued: Yes (comment) Precaution Comments: Pt recalls 1/3 THP without cues.  All THP reviewed. Restrictions Weight Bearing Restrictions: No Other Position/Activity Restrictions: WBAT    Mobility  Bed Mobility Overal bed mobility: Needs Assistance Bed Mobility: Supine to Sit;Sit to Supine     Supine to sit: Min assist Sit to supine: Min assist;Mod assist   General bed mobility comments: INcreased time with cues for sequence, use of L LE to self assist and adherence to THP; physical assist to manage R LE and control trunk  Transfers Overall transfer level: Needs assistance Equipment used: Rolling walker (2 wheeled) Transfers: Sit to/from Stand Sit to Stand: Min guard         General transfer comment: cues for LE management and use of UEs to self assist  Ambulation/Gait Ambulation/Gait assistance: Min guard Gait Distance (Feet): 220 Feet(and 15' into bathroom) Assistive device: Rolling walker (2 wheeled) Gait Pattern/deviations: Step-to pattern;Step-through pattern;Decreased step length - right;Decreased step length - left;Shuffle;Trunk flexed Gait velocity: decr   General Gait Details: cues for sequence, posture, position from RW and adherence to THP   Stairs             Wheelchair Mobility    Modified Rankin (Stroke  Patients Only)       Balance Overall balance assessment: Needs assistance Sitting-balance support: No upper extremity supported;Feet supported Sitting balance-Leahy Scale: Good     Standing balance support: Bilateral upper extremity supported Standing balance-Leahy Scale: Fair                              Cognition Arousal/Alertness: Awake/alert Behavior During Therapy: WFL for tasks assessed/performed Overall Cognitive Status: No family/caregiver present to determine baseline cognitive functioning                                 General Comments: slow processing and requiring repetition and explanation      Exercises Total Joint Exercises Ankle Circles/Pumps: AROM;Both;15 reps;Supine    General Comments        Pertinent Vitals/Pain Pain Assessment: 0-10 Pain Score: 3  Pain Location: R hip Pain Descriptors / Indicators: Aching;Sore Pain Intervention(s): Limited activity within patient's tolerance;Monitored during session;Premedicated before session    Home Living                      Prior Function            PT Goals (current goals can now be found in the care plan section) Acute Rehab PT Goals Patient Stated Goal: Regain IND PT Goal Formulation: With patient Time For Goal Achievement: 10/07/19 Potential to Achieve Goals: Good Progress towards PT goals: Progressing toward goals    Frequency    7X/week  PT Plan Current plan remains appropriate    Co-evaluation              AM-PAC PT "6 Clicks" Mobility   Outcome Measure  Help needed turning from your back to your side while in a flat bed without using bedrails?: A Lot Help needed moving from lying on your back to sitting on the side of a flat bed without using bedrails?: A Little Help needed moving to and from a bed to a chair (including a wheelchair)?: A Little Help needed standing up from a chair using your arms (e.g., wheelchair or bedside chair)?: A  Little Help needed to walk in hospital room?: A Little Help needed climbing 3-5 steps with a railing? : A Little 6 Click Score: 17    End of Session Equipment Utilized During Treatment: Gait belt Activity Tolerance: Patient tolerated treatment well Patient left: in bed;with call bell/phone within reach;with bed alarm set Nurse Communication: Mobility status PT Visit Diagnosis: Difficulty in walking, not elsewhere classified (R26.2)     Time: 4496-7591 PT Time Calculation (min) (ACUTE ONLY): 44 min  Charges:  $Gait Training: 23-37 mins $Therapeutic Activity: 8-22 mins                     [place] Pager [phone] Office [phone]    Iasha Mccalister 09/23/2019, 4:43 PM

## 2019-09-23 NOTE — Consult Note (Signed)
Date of Admission:  09/22/2019          Reason for Consult: Septic prosthetic hip joint   Referring Provider: Dr. Despina Hick   Assessment:    1.  Septic prosthetic hip joint due to  2. MSSA sp right hip irrigation and debridement with exchange of the bearing surface 3. Hyperlipidemia  Plan:  1. Agree with continuing cefazolin and would plan on 8 weeks of therapy prior to switching to chronic oral therapy 2. We will add rifampin orally 300 mg twice daily to penetrate biofilms and act as an adjunct of antibiotic if she can tolerate it 3. 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 aspirin EC  325 mg Oral BID  . atorvastatin  40 mg Oral QPM  . Chlorhexidine Gluconate Cloth  6 each Topical Daily  . docusate sodium  100 mg Oral BID  . escitalopram  10 mg Oral Daily  . gabapentin  100 mg Oral BID  . levothyroxine  25 mcg Oral QAC breakfast  . mirabegron ER  25 mg Oral Daily  . pantoprazole  40 mg Oral Daily  . rifampin  300 mg Oral Q12H  . sodium chloride flush  10-40 mL Intracatheter Q12H   Continuous Infusions: . sodium chloride 75 mL/hr at 09/22/19 1810  .  ceFAZolin (ANCEF) IV 2 g (09/23/19 1418)  . methocarbamol (ROBAXIN) IV 500 mg (09/22/19 1710)   PRN Meds:.acetaminophen, bisacodyl, HYDROcodone-acetaminophen, HYDROcodone-acetaminophen, magnesium citrate, menthol-cetylpyridinium **OR** phenol, methocarbamol **OR** methocarbamol (ROBAXIN) IV, metoCLOPramide **OR** metoCLOPramide (REGLAN) injection, morphine injection, ondansetron **OR** ondansetron (ZOFRAN) IV, polyethylene glycol, sodium chloride flush  HPI: Elizabeth Shepard is a 79 y.o. female with past medical history significant for right total hip arthroplasty in 2007.  She apparently  has having no problems when that she suddenly developed spontaneous drainage from her right hip more than 10 days prior to admission.  Did not have hip pain or fever chills or other systemic symptoms.  She was seen in the office and had a draining sinus in the lateral hip with surrounding erythema the area was aspirated in the office and had a high white blood cell count and stopped methicillin sensitive staph coccus aureus was obtained from culture from 5 April.  She was placed on oral Keflex and arrangements were made for surgery she came in for surgery and yesterday underwent irrigation and debridement with exchange of the bearings.  I will plan on giving her 6 to 8 weeks of IV cefazolin, with adjunctive rifampin if she can tolerate it.  This will be followed by 6 months to more likely a year or longer of oral antibiotics.  I will arrange for her to be seen in my clinic and we will give her my card tomorrow.   Review of Systems: Review of Systems  Constitutional: Negative for chills, fever, malaise/fatigue and weight loss.  HENT: Negative for congestion and sore throat.   Eyes: Negative for blurred vision and photophobia.  Respiratory: Negative for cough, shortness of breath and wheezing.   Cardiovascular: Negative for chest pain, palpitations and leg swelling.  Gastrointestinal: Negative for abdominal pain, blood in stool, constipation, diarrhea, heartburn, melena, nausea and vomiting.  Genitourinary: Negative for dysuria, flank pain and hematuria.  Musculoskeletal:  Positive for joint pain and myalgias. Negative for back pain and falls.  Skin: Negative for itching and rash.  Neurological: Negative for dizziness, focal weakness, loss of consciousness, weakness and headaches.  Endo/Heme/Allergies: Does not bruise/bleed easily.  Psychiatric/Behavioral: Negative for depression and suicidal ideas. The patient does not have insomnia.     Past Medical History:  Diagnosis Date  . Anxiety   .  Depression   . Generalized pain   . Hypercholesteremia   . Hypertension   . Hypothyroidism   . Thyroid disease   . UTI (lower urinary tract infection)     Social History   Tobacco Use  . Smoking status: Never Smoker  . Smokeless tobacco: Never Used  Substance Use Topics  . Alcohol use: No  . Drug use: No    Family History  Problem Relation Age of Onset  . CAD Mother   . Diabetes Mother   . CAD Father   . Diabetes Father   . Bladder Cancer Neg Hx   . Prostate cancer Neg Hx   . Kidney cancer Neg Hx    Allergies  Allergen Reactions  . Ciprofloxacin Itching and Rash    OBJECTIVE: Blood pressure (!) 119/54, pulse 72, temperature 98.1 F (36.7 C), temperature source Oral, resp. rate 16, height 5\' 4"  (1.626 m), weight 79 kg, SpO2 91 %.  Physical Exam Constitutional:      General: She is not in acute distress.    Appearance: Normal appearance. She is well-developed. She is not ill-appearing or diaphoretic.  HENT:     Head: Normocephalic and atraumatic.     Right Ear: Hearing and external ear normal.     Left Ear: Hearing and external ear normal.     Nose: No nasal deformity or rhinorrhea.  Eyes:     General: No scleral icterus.    Conjunctiva/sclera: Conjunctivae normal.     Right eye: Right conjunctiva is not injected.     Left eye: Left conjunctiva is not injected.     Pupils: Pupils are equal, round, and reactive to light.  Neck:     Vascular: No JVD.  Cardiovascular:     Rate and Rhythm: Normal rate and regular rhythm.     Heart sounds: Normal heart sounds, S1 normal and S2 normal. No murmur. No friction rub.  Pulmonary:     Effort: No respiratory distress.     Breath sounds: No wheezing.  Abdominal:     General: Bowel sounds are normal. There is no distension.     Palpations: Abdomen is soft.     Tenderness: There is no abdominal tenderness.  Musculoskeletal:        General: Normal range of motion.     Right shoulder: Normal.     Left shoulder: Normal.      Cervical back: Normal range of motion and neck supple.     Right hip: Normal.     Left hip: Normal.     Right knee: Normal.     Left knee: Normal.  Lymphadenopathy:     Head:     Right side of head: No submandibular, preauricular or posterior auricular adenopathy.     Left side of head: No submandibular, preauricular or posterior auricular adenopathy.     Cervical: No cervical adenopathy.     Right cervical: No superficial or deep cervical adenopathy.    Left cervical: No superficial or deep cervical adenopathy.  Skin:    General: Skin is warm and dry.  Coloration: Skin is not pale.     Findings: No abrasion, bruising, ecchymosis, erythema, lesion or rash.     Nails: There is no clubbing.  Neurological:     General: No focal deficit present.     Mental Status: She is alert and oriented to person, place, and time.     Sensory: No sensory deficit.  Psychiatric:        Attention and Perception: She is attentive.        Mood and Affect: Mood normal.        Speech: Speech normal.        Behavior: Behavior normal. Behavior is cooperative.        Thought Content: Thought content normal.        Judgment: Judgment normal.    Hip with dressing patient draped so that PICC line can be placed Lab Results Lab Results  Component Value Date   WBC 5.8 09/23/2019   HGB 8.9 (L) 09/23/2019   HCT 29.0 (L) 09/23/2019   MCV 91.5 09/23/2019   PLT 354 09/23/2019    Lab Results  Component Value Date   CREATININE 0.97 09/23/2019   BUN 18 09/23/2019   NA 139 09/23/2019   K 4.9 09/23/2019   CL 105 09/23/2019   CO2 26 09/23/2019    Lab Results  Component Value Date   ALT 17 09/22/2019   AST 29 09/22/2019   ALKPHOS 71 09/22/2019   BILITOT 1.0 09/22/2019     Microbiology: Recent Results (from the past 240 hour(s))  Respiratory Panel by RT PCR (Flu A&B, Covid) - Nasopharyngeal Swab     Status: None   Collection Time: 09/22/19 12:33 PM   Specimen: Nasopharyngeal Swab  Result Value  Ref Range Status   SARS Coronavirus 2 by RT PCR NEGATIVE NEGATIVE Final    Comment: (NOTE) SARS-CoV-2 target nucleic acids are NOT DETECTED. The SARS-CoV-2 RNA is generally detectable in upper respiratoy specimens during the acute phase of infection. The lowest concentration of SARS-CoV-2 viral copies this assay can detect is 131 copies/mL. A negative result does not preclude SARS-Cov-2 infection and should not be used as the sole basis for treatment or other patient management decisions. A negative result may occur with  improper specimen collection/handling, submission of specimen other than nasopharyngeal swab, presence of viral mutation(s) within the areas targeted by this assay, and inadequate number of viral copies (<131 copies/mL). A negative result must be combined with clinical observations, patient history, and epidemiological information. The expected result is Negative. Fact Sheet for Patients:  https://www.moore.com/ Fact Sheet for Healthcare Providers:  https://www.young.biz/ This test is not yet ap proved or cleared by the [place] FDA and  has been authorized for detection and/or diagnosis of SARS-CoV-2 by FDA under an Emergency Use Authorization (EUA). This EUA will remain  in effect (meaning this test can be used) for the duration of the COVID-19 declaration under Section 564(b)(1) of the Act, 21 U.S.C. section 360bbb-3(b)(1), unless the authorization is terminated or revoked sooner.    Influenza A by PCR NEGATIVE NEGATIVE Final   Influenza B by PCR NEGATIVE NEGATIVE Final    Comment: (NOTE) The Xpert Xpress SARS-CoV-2/FLU/RSV assay is intended as an aid in  the diagnosis of influenza from Nasopharyngeal swab specimens and  should not be used as a sole basis for treatment. Nasal washings and  aspirates are unacceptable for Xpert Xpress SARS-CoV-2/FLU/RSV  testing. Fact Sheet for  Patients: https://www.moore.com/ Fact Sheet for Healthcare Providers: https://www.young.biz/ This test  is not yet approved or cleared by the [place] and  has been authorized for detection and/or diagnosis of SARS-CoV-2 by  FDA under an Emergency Use Authorization (EUA). This EUA will remain  in effect (meaning this test can be used) for the duration of the  Covid-19 declaration under Section 564(b)(1) of the Act, 21  U.S.C. section 360bbb-3(b)(1), unless the authorization is  terminated or revoked. Performed at [facility], [place] [address] [place], [place] [address]   Aerobic/Anaerobic Culture (surgical/deep wound)     Status: None (Preliminary result)   Collection Time: 09/22/19  3:38 PM   Specimen: PATH Cytology Misc. fluid; Body Fluid  Result Value Ref Range Status   Specimen Description   Final    FLUID HIP RIGHT Performed at [facility] [address] [place], [place] [address]    Special Requests   Final    NONE Performed at [facility], [place] [address], [place], [place] [address]    Gram Stain   Final    ABUNDANT WBC PRESENT,BOTH PMN AND MONONUCLEAR NO ORGANISMS SEEN    Culture   Final    NO GROWTH < 24 HOURS Performed at [facility] [address], [place], [place] [address]    Report Status PENDING  Incomplete    Alcide Evener, [place] [facility] [facility] [phone] pager  09/23/2019, 3:22 PM

## 2019-09-23 NOTE — Plan of Care (Signed)

## 2019-09-23 NOTE — Progress Notes (Signed)
Peripherally Inserted Central Catheter Placement  The IV Nurse has discussed with the patient and/or persons authorized to consent for the patient, the purpose of this procedure and the potential benefits and risks involved with this procedure.  The benefits include less needle sticks, lab draws from the catheter, and the patient may be discharged home with the catheter. Risks include, but not limited to, infection, bleeding, blood clot (thrombus formation), and puncture of an artery; nerve damage and irregular heartbeat and possibility to perform a PICC exchange if needed/ordered by physician.  Alternatives to this procedure were also discussed.  Bard Power PICC patient education guide, fact sheet on infection prevention and patient information card has been provided to patient /or left at bedside.    PICC Placement Documentation  PICC Single Lumen 09/23/19 PICC Right Basilic 37 cm 1 cm (Active)  Indication for Insertion or Continuance of Line Home intravenous therapies (PICC only) 09/23/19 1452  Exposed Catheter (cm) 1 cm 09/23/19 1452  Site Assessment Clean;Dry;Intact 09/23/19 1452  Line Status Flushed;Saline locked;Blood return noted 09/23/19 1452  Dressing Type Transparent;Securing device 09/23/19 1452  Dressing Status Clean;Dry;Intact;Antimicrobial disc in place 09/23/19 1452  Dressing Change Due 09/30/19 09/23/19 1452       Reginia Forts Albarece 09/23/2019, 2:54 PM

## 2019-09-24 ENCOUNTER — Encounter: Payer: Self-pay | Admitting: *Deleted

## 2019-09-24 LAB — CBC
HCT: 28 % — ABNORMAL LOW (ref 36.0–46.0)
Hemoglobin: 8.5 g/dL — ABNORMAL LOW (ref 12.0–15.0)
MCH: 27.6 pg (ref 26.0–34.0)
MCHC: 30.4 g/dL (ref 30.0–36.0)
MCV: 90.9 fL (ref 80.0–100.0)
Platelets: 360 10*3/uL (ref 150–400)
RBC: 3.08 MIL/uL — ABNORMAL LOW (ref 3.87–5.11)
RDW: 14.6 % (ref 11.5–15.5)
WBC: 8.1 10*3/uL (ref 4.0–10.5)
nRBC: 0 % (ref 0.0–0.2)

## 2019-09-24 LAB — BASIC METABOLIC PANEL
Anion gap: 8 (ref 5–15)
BUN: 22 mg/dL (ref 8–23)
CO2: 26 mmol/L (ref 22–32)
Calcium: 8.6 mg/dL — ABNORMAL LOW (ref 8.9–10.3)
Chloride: 103 mmol/L (ref 98–111)
Creatinine, Ser: 1.11 mg/dL — ABNORMAL HIGH (ref 0.44–1.00)
GFR calc Af Amer: 55 mL/min — ABNORMAL LOW (ref >60)
GFR calc non Af Amer: 48 mL/min — ABNORMAL LOW (ref >60)
Glucose, Bld: 122 mg/dL — ABNORMAL HIGH (ref 70–99)
Potassium: 4.4 mmol/L (ref 3.5–5.1)
Sodium: 137 mmol/L (ref 135–145)

## 2019-09-24 MED ORDER — RIFAMPIN 300 MG PO CAPS: 300.0000 mg | ORAL_CAPSULE | Freq: Two times a day (BID) | ORAL | 0 refills | Status: DC

## 2019-09-24 MED ORDER — CEFAZOLIN IV (FOR PTA / DISCHARGE USE ONLY): 2.0000 g | Freq: Three times a day (TID) | INTRAVENOUS | 0 refills | Status: AC

## 2019-09-24 MED ORDER — METHOCARBAMOL 500 MG PO TABS: 500.0000 mg | ORAL_TABLET | Freq: Four times a day (QID) | ORAL | 0 refills | Status: AC | PRN

## 2019-09-24 MED ORDER — HYDROCODONE-ACETAMINOPHEN 5-325 MG PO TABS: 1.0000 | ORAL_TABLET | ORAL | 0 refills | Status: AC | PRN

## 2019-09-24 MED ORDER — ASPIRIN 325 MG PO TBEC: 325.0000 mg | DELAYED_RELEASE_TABLET | Freq: Two times a day (BID) | ORAL | 0 refills | Status: AC

## 2019-09-24 NOTE — Progress Notes (Signed)
Subjective: No new complaints   Antibiotics:  Anti-infectives (From admission, onward)   Start     Dose/Rate Route Frequency Ordered Stop   09/24/19 0000  ceFAZolin (ANCEF) IVPB     2 g Intravenous Every 8 hours 09/24/19 0805 11/17/19 2359   09/24/19 0000  rifampin (RIFADIN) 300 MG capsule     300 mg Oral Every 12 hours 09/24/19 0805 11/19/19 2359   09/23/19 2200  rifampin (RIFADIN) capsule 300 mg     300 mg Oral Every 12 hours 09/23/19 1522     09/23/19 1400  ceFAZolin (ANCEF) IVPB 2g/100 mL premix     2 g 200 mL/hr over 30 Minutes Intravenous Every 8 hours 09/23/19 1137     09/23/19 0600  ceFAZolin (ANCEF) IVPB 2g/100 mL premix     2 g 200 mL/hr over 30 Minutes Intravenous On call to O.R. 09/22/19 1232 09/22/19 1525   09/22/19 2200  ceFAZolin (ANCEF) IVPB 2g/100 mL premix     2 g 200 mL/hr over 30 Minutes Intravenous Every 6 hours 09/22/19 1814 09/23/19 0431      Medications: Scheduled Meds: . aspirin EC  325 mg Oral BID  . atorvastatin  40 mg Oral QPM  . Chlorhexidine Gluconate Cloth  6 each Topical Daily  . docusate sodium  100 mg Oral BID  . escitalopram  10 mg Oral Daily  . gabapentin  100 mg Oral BID  . levothyroxine  25 mcg Oral QAC breakfast  . mirabegron ER  25 mg Oral Daily  . pantoprazole  40 mg Oral Daily  . rifampin  300 mg Oral Q12H  . sodium chloride flush  10-40 mL Intracatheter Q12H   Continuous Infusions: . sodium chloride 10 mL/hr at 09/23/19 1400  .  ceFAZolin (ANCEF) IV 2 g (09/24/19 1307)  . methocarbamol (ROBAXIN) IV 500 mg (09/22/19 1710)   PRN Meds:.acetaminophen, bisacodyl, HYDROcodone-acetaminophen, HYDROcodone-acetaminophen, magnesium citrate, menthol-cetylpyridinium **OR** phenol, methocarbamol **OR** methocarbamol (ROBAXIN) IV, metoCLOPramide **OR** metoCLOPramide (REGLAN) injection, morphine injection, ondansetron **OR** ondansetron (ZOFRAN) IV, polyethylene glycol, sodium chloride flush    Objective: Weight change:    Intake/Output Summary (Last 24 hours) at 09/24/2019 1328 Last data filed at 09/24/2019 0917 Gross per 24 hour  Intake 800 ml  Output 1600 ml  Net -800 ml   Blood pressure (!) 148/73, pulse 65, temperature 97.7 F (36.5 C), temperature source Oral, resp. rate 16, height 5' 4"  (1.626 m), weight 79 kg, SpO2 94 %. Temp:  [97.7 F (36.5 C)-98.2 F (36.8 C)] 97.7 F (36.5 C) (04/09 0518) Pulse Rate:  [64-72] 65 (04/09 0518) Resp:  [16-18] 16 (04/09 0518) BP: (119-152)/(54-74) 148/73 (04/09 0518) SpO2:  [91 %-94 %] 94 % (04/09 0518)  Physical Exam: General: Alert and awake, oriented x3, not in any acute distress. HEENT: anicteric sclera, EOMI CVS regular rate, normal  Chest: , no wheezing, no respiratory distress Abdomen: soft non-distended,  Extremities: hip  With bandage Skin: no rashes Neuro: nonfocal  CBC:    BMET Recent Labs    09/23/19 0327 09/24/19 0330  NA 139 137  K 4.9 4.4  CL 105 103  CO2 26 26  GLUCOSE 156* 122*  BUN 18 22  CREATININE 0.97 1.11*  CALCIUM 8.5* 8.6*     Liver Panel  Recent Labs    09/22/19 1244  PROT 8.1  ALBUMIN 3.9  AST 29  ALT 17  ALKPHOS 71  BILITOT 1.0       Sedimentation  Rate No results for input(s): ESRSEDRATE in the last 72 hours. C-Reactive Protein No results for input(s): CRP in the last 72 hours.  Micro Results: Recent Results (from the past 720 hour(s))  Respiratory Panel by RT PCR (Flu A&B, Covid) - Nasopharyngeal Swab     Status: None   Collection Time: 09/22/19 12:33 PM   Specimen: Nasopharyngeal Swab  Result Value Ref Range Status   SARS Coronavirus 2 by RT PCR NEGATIVE NEGATIVE Final    Comment: (NOTE) SARS-CoV-2 target nucleic acids are NOT DETECTED. The SARS-CoV-2 RNA is generally detectable in upper respiratoy specimens during the acute phase of infection. The lowest concentration of SARS-CoV-2 viral copies this assay can detect is 131 copies/mL. A negative result does not preclude  SARS-Cov-2 infection and should not be used as the sole basis for treatment or other patient management decisions. A negative result may occur with  improper specimen collection/handling, submission of specimen other than nasopharyngeal swab, presence of viral mutation(s) within the areas targeted by this assay, and inadequate number of viral copies (<131 copies/mL). A negative result must be combined with clinical observations, patient history, and epidemiological information. The expected result is Negative. Fact Sheet for Patients:  PinkCheek.be Fact Sheet for Healthcare Providers:  GravelBags.it This test is not yet ap proved or cleared by the [place] FDA and  has been authorized for detection and/or diagnosis of SARS-CoV-2 by FDA under an Emergency Use Authorization (EUA). This EUA will remain  in effect (meaning this test can be used) for the duration of the COVID-19 declaration under Section 564(b)(1) of the Act, 21 U.S.C. section 360bbb-3(b)(1), unless the authorization is terminated or revoked sooner.    Influenza A by PCR NEGATIVE NEGATIVE Final   Influenza B by PCR NEGATIVE NEGATIVE Final    Comment: (NOTE) The Xpert Xpress SARS-CoV-2/FLU/RSV assay is intended as an aid in  the diagnosis of influenza from Nasopharyngeal swab specimens and  should not be used as a sole basis for treatment. Nasal washings and  aspirates are unacceptable for Xpert Xpress SARS-CoV-2/FLU/RSV  testing. Fact Sheet for Patients: PinkCheek.be Fact Sheet for Healthcare Providers: GravelBags.it This test is not yet approved or cleared by the [place] FDA and  has been authorized for detection and/or diagnosis of SARS-CoV-2 by  FDA under an Emergency Use Authorization (EUA). This EUA will remain  in effect (meaning this test can be used) for the duration of the  Covid-19  declaration under Section 564(b)(1) of the Act, 21  U.S.C. section 360bbb-3(b)(1), unless the authorization is  terminated or revoked. Performed at [facility], [place] [address] [place], [place] [address]   Aerobic/Anaerobic Culture (surgical/deep wound)     Status: None (Preliminary result)   Collection Time: 09/22/19  3:38 PM   Specimen: PATH Cytology Misc. fluid; Body Fluid  Result Value Ref Range Status   Specimen Description   Final    FLUID HIP RIGHT Performed at [facility] [address], [place], [place] [address]    Special Requests   Final    NONE Performed at [facility], [place] [address] [place], [place] [address]    Gram Stain   Final    ABUNDANT WBC PRESENT,BOTH PMN AND MONONUCLEAR NO ORGANISMS SEEN    Culture   Final    NO GROWTH < 24 HOURS Performed at [facility] [address] [place],  [address]    Report Status PENDING  Incomplete  Studies/Results: [place] EKG SITE RITE  Result Date: 09/23/2019 If Site Rite image not attached, placement could not be confirmed due to current cardiac rhythm.     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  Methicillin sensitive Staph aureus has been isolated on culture.  1.  MSSA prosthetic hip infection status post I&D and exchange of bearing surface:  Continue cefazolin to complete somewhere during 6 and 8 weeks of IV therapy with plan for 8  Continue rifampin 300 mg twice daily there will be staining her contact lenses if she wears them.  We will then follow this with protracted oral Keflex plus or minus rifampin if she can tolerate it for a minimum of 6 months but likely much  longer.  Diagnosis: MSSA prosthetic hip infection  Culture Result: MSSA  Allergies  Allergen Reactions  . Ciprofloxacin Itching and Rash    OPAT Orders Discharge antibiotics:  Cefazolin   Duration:  8 weeks End Date:  June 1st, 2021  [place] Per Protocol:   Labs  weekly while on IV antibiotics: _x_ CBC with differential _x_ CMP w GFR/CMP _x_ CRP _x_ ESR    _x_ Please pull PIC at completion of IV antibiotics __ Please leave PIC in place until doctor has seen patient or been notified  Fax weekly labs to [phone]  Clinic Follow Up Appt:   Elizabeth Shepard has an appointment on May 19th, 2021 with Dr Tommy Medal at 62PM  The [facility] is located in the [facility] at  [address] in [place].  [address], which is located to the left of the elevators.  Phone: [phone]  Fax: [phone]  [email or web address]  She should arrive 15 minutes prior to her appt.  I will sign off for now.  Please call with further questions.     LOS: 2 days   Alcide Evener 09/24/2019, 1:28 PM

## 2019-09-24 NOTE — Progress Notes (Signed)
Physical Therapy Treatment Patient Details Name: Elizabeth Shepard MRN: [id number] DOB: November 25, 1940 Today's Date: 09/24/2019    History of Present Illness Pt s/p R THR I&D with bearing surface exchange.  Pt with hx of bil THR.    PT Comments    Pt continues very cooperative and progressing well with mobility but continues to require frequent cueing for follow through with posterior THP.   Follow Up Recommendations  Follow surgeon's recommendation for DC plan and follow-up therapies     Equipment Recommendations  None recommended by PT    Recommendations for Other Services OT consult     Precautions / Restrictions Precautions Precautions: Posterior Hip;Fall Precaution Booklet Issued: Yes (comment) Precaution Comments: Pt recalls 1/3 THP without cues.  All THP reviewed. Restrictions Weight Bearing Restrictions: No Other Position/Activity Restrictions: WBAT    Mobility  Bed Mobility Overal bed mobility: Needs Assistance Bed Mobility: Supine to Sit     Supine to sit: Supervision     General bed mobility comments: INcreased time with cues for sequence, use of L LE to self assist and adherence to THP;   Transfers Overall transfer level: Needs assistance Equipment used: Rolling walker (2 wheeled) Transfers: Sit to/from Stand Sit to Stand: Supervision         General transfer comment: cues for LE management and use of UEs to self assist  Ambulation/Gait Ambulation/Gait assistance: Min guard;Supervision Gait Distance (Feet): 240 Feet(and additional 15' into bathroom) Assistive device: Rolling walker (2 wheeled) Gait Pattern/deviations: Step-to pattern;Step-through pattern;Decreased step length - right;Decreased step length - left;Shuffle;Trunk flexed Gait velocity: decr   General Gait Details: cues for sequence, posture, position from RW and adherence to THP   Stairs             Wheelchair Mobility    Modified Rankin (Stroke Patients Only)       Balance  Overall balance assessment: Needs assistance Sitting-balance support: No upper extremity supported;Feet supported Sitting balance-Leahy Scale: Good     Standing balance support: Bilateral upper extremity supported Standing balance-Leahy Scale: Fair                              Cognition Arousal/Alertness: Awake/alert Behavior During Therapy: WFL for tasks assessed/performed Overall Cognitive Status: No family/caregiver present to determine baseline cognitive functioning                                 General Comments: slow processing and requiring repetition and explanation      Exercises Total Joint Exercises Ankle Circles/Pumps: AROM;Both;15 reps;Supine    General Comments        Pertinent Vitals/Pain Pain Assessment: 0-10 Pain Score: 2  Pain Location: R hip Pain Descriptors / Indicators: Aching;Sore Pain Intervention(s): Limited activity within patient's tolerance;Monitored during session;Premedicated before session    Home Living                      Prior Function            PT Goals (current goals can now be found in the care plan section) Acute Rehab PT Goals Patient Stated Goal: Regain IND PT Goal Formulation: With patient Time For Goal Achievement: 10/07/19 Potential to Achieve Goals: Good Progress towards PT goals: Progressing toward goals    Frequency    7X/week      PT Plan Current plan remains appropriate  Co-evaluation              AM-PAC PT "6 Clicks" Mobility   Outcome Measure  Help needed turning from your back to your side while in a flat bed without using bedrails?: A Lot Help needed moving from lying on your back to sitting on the side of a flat bed without using bedrails?: A Little Help needed moving to and from a bed to a chair (including a wheelchair)?: A Little Help needed standing up from a chair using your arms (e.g., wheelchair or bedside chair)?: A Little Help needed to walk in  hospital room?: A Little Help needed climbing 3-5 steps with a railing? : A Little 6 Click Score: 17    End of Session Equipment Utilized During Treatment: Gait belt Activity Tolerance: Patient tolerated treatment well Patient left: in chair;with call bell/phone within reach;with chair alarm set;Other (comment)(Physican in room) Nurse Communication: Mobility status PT Visit Diagnosis: Difficulty in walking, not elsewhere classified (R26.2)     Time: 9794-8016 PT Time Calculation (min) (ACUTE ONLY): 17 min  Charges:  $Gait Training: 8-22 mins $Therapeutic Activity: 8-22 mins                     [place] Pager [phone] Office [phone]    Jailani Hogans 09/24/2019, 12:53 PM

## 2019-09-24 NOTE — Care Management Important Message (Signed)
Important Message  Patient Details IM Letter given to Amada Jupiter SW Case Manager to present to the Patient Name: Elizabeth Shepard MRN: [id number] Date of Birth: September 18, 1940   Medicare Important Message Given:  Yes     Caren Macadam 09/24/2019, 11:05 AM

## 2019-09-24 NOTE — TOC Progression Note (Signed)
Transition of Care [facility]) - Progression Note    Patient Details  Name: CHRISTI WIRICK MRN: [id number] Date of Birth: 21-Apr-1941  Transition of [facility]) CM/SW Contact  Amada Jupiter, LCSW Phone Number: 09/24/2019, 12:14 PM  Clinical Narrative:   Have confirmed and informed pt/spouse that [facility] has been secured to manage her home IV abx, however, they cannot begin until tomorrow.  Plan to hold on d/c until tomorrow.    Expected Discharge Plan: Home w Home Health Services Barriers to Discharge: Continued Medical Work up  Expected Discharge Plan and Services Expected Discharge Plan: Home w Home Health Services In-house Referral: Clinical Social Work   Post Acute Care Choice: Home Health Living arrangements for the past 2 months: Single Family Home Expected Discharge Date: 09/24/19               DME Arranged: N/A(has needed DME) DME Agency: NA                   Social Determinants of Health (SDOH) Interventions    Readmission Risk Interventions Readmission Risk Prevention Plan 09/23/2019  Post Dischage Appt Complete  Medication Screening Complete  Some recent data might be hidden

## 2019-09-24 NOTE — Progress Notes (Signed)
Physical Therapy Treatment Patient Details Name: Elizabeth Shepard MRN: [id number] DOB: August 30, 1940 Today's Date: 09/24/2019    History of Present Illness Pt s/p R THR I&D with bearing surface exchange.  Pt with hx of bil THR.    PT Comments    Pt reviewed THP, bed mobility including log roll with pillows between knees to sleep on side and car transfers.  Will follow up in am with stair training.     Follow Up Recommendations  Follow surgeon's recommendation for DC plan and follow-up therapies     Equipment Recommendations  None recommended by PT    Recommendations for Other Services OT consult     Precautions / Restrictions Precautions Precautions: Posterior Hip;Fall Precaution Booklet Issued: Yes (comment) Precaution Comments: Pt recalls 2/3 THP without cues.  All THP reviewed. Restrictions Weight Bearing Restrictions: No Other Position/Activity Restrictions: WBAT    Mobility  Bed Mobility Overal bed mobility: Needs Assistance Bed Mobility: Supine to Sit     Supine to sit: Supervision     General bed mobility comments: INcreased time with cues for sequence, use of L LE to self assist and adherence to THP;   Transfers Overall transfer level: Needs assistance Equipment used: Rolling walker (2 wheeled) Transfers: Sit to/from Stand Sit to Stand: Supervision         General transfer comment: cues for LE management and use of UEs to self assist  Ambulation/Gait Ambulation/Gait assistance: Min guard;Supervision Gait Distance (Feet): 15 Feet Assistive device: Rolling walker (2 wheeled) Gait Pattern/deviations: Step-to pattern;Step-through pattern;Decreased step length - right;Decreased step length - left;Shuffle;Trunk flexed Gait velocity: decr   General Gait Details: cues for sequence, posture, position from RW and adherence to THP   Stairs             Wheelchair Mobility    Modified Rankin (Stroke Patients Only)       Balance Overall balance  assessment: Needs assistance Sitting-balance support: No upper extremity supported;Feet supported Sitting balance-Leahy Scale: Good     Standing balance support: Bilateral upper extremity supported Standing balance-Leahy Scale: Fair                              Cognition Arousal/Alertness: Awake/alert Behavior During Therapy: WFL for tasks assessed/performed Overall Cognitive Status: No family/caregiver present to determine baseline cognitive functioning                                 General Comments: slow processing and requiring repetition and explanation      Exercises      General Comments        Pertinent Vitals/Pain Pain Assessment: 0-10 Pain Score: 2  Pain Location: R hip Pain Descriptors / Indicators: Aching;Sore Pain Intervention(s): Limited activity within patient's tolerance;Monitored during session;Premedicated before session    Home Living                      Prior Function            PT Goals (current goals can now be found in the care plan section) Acute Rehab PT Goals Patient Stated Goal: Regain IND PT Goal Formulation: With patient Time For Goal Achievement: 10/07/19 Potential to Achieve Goals: Good Progress towards PT goals: Progressing toward goals    Frequency    7X/week      PT Plan Current plan remains appropriate  Co-evaluation              AM-PAC PT "6 Clicks" Mobility   Outcome Measure  Help needed turning from your back to your side while in a flat bed without using bedrails?: A Lot Help needed moving from lying on your back to sitting on the side of a flat bed without using bedrails?: A Little Help needed moving to and from a bed to a chair (including a wheelchair)?: A Little Help needed standing up from a chair using your arms (e.g., wheelchair or bedside chair)?: A Little Help needed to walk in hospital room?: A Little Help needed climbing 3-5 steps with a railing? : A  Little 6 Click Score: 17    End of Session Equipment Utilized During Treatment: Gait belt Activity Tolerance: Patient tolerated treatment well Patient left: Other (comment)(bathroom) Nurse Communication: Mobility status PT Visit Diagnosis: Difficulty in walking, not elsewhere classified (R26.2)     Time: 6568-1275 PT Time Calculation (min) (ACUTE ONLY): 17 min  Charges:  $Therapeutic Activity: 8-22 mins                     Mauro Kaufmann PT Acute Rehabilitation Services Pager [phone] Office [phone]    Elizabeth Shepard 09/24/2019, 5:21 PM

## 2019-09-24 NOTE — Plan of Care (Signed)
  Problem: Education: Goal: Knowledge of General Education information will improve Description: Including pain rating scale, medication(s)/side effects and non-pharmacologic comfort measures 09/24/2019 0138 by Brett Albino, RN Outcome: Progressing 09/24/2019 0134 by Brett Albino, RN Outcome: Progressing   Problem: Activity: Goal: Risk for activity intolerance will decrease Outcome: Progressing   Problem: Nutrition: Goal: Adequate nutrition will be maintained Outcome: Progressing  See note written at 4/9 0137

## 2019-09-24 NOTE — Plan of Care (Signed)

## 2019-09-24 NOTE — Plan of Care (Signed)
  Problem: Education: Goal: Knowledge of General Education information will improve Description: Including pain rating scale, medication(s)/side effects and non-pharmacologic comfort measures Outcome: Progressing   Problem: Activity: Goal: Risk for activity intolerance will decrease Outcome: Progressing   Problem: Nutrition: Goal: Adequate nutrition will be maintained Outcome: Progressing   

## 2019-09-24 NOTE — Progress Notes (Signed)
   Subjective: 2 Days Post-Op Procedure(s) (LRB): IRRIGATION AND DEBRIDEMENT RIGHT HIP; BEARING SURFACE REVISION (Right) Patient reports pain as mild.   Patient seen in rounds by Dr. Lequita Halt. Patient is well, and has had no acute complaints or problems. States she is ready to go home. Denies chest pain, SOB, or calf pain. Voiding without difficulty. Positive flatus. Plan is to go Home after hospital stay.  Objective: Vital signs in last 24 hours: Temp:  [97.7 F (36.5 C)-98.2 F (36.8 C)] 97.7 F (36.5 C) (04/09 0518) Pulse Rate:  [64-74] 65 (04/09 0518) Resp:  [16-18] 16 (04/09 0518) BP: (117-152)/(54-74) 148/73 (04/09 0518) SpO2:  [91 %-100 %] 94 % (04/09 0518)  Intake/Output from previous day:  Intake/Output Summary (Last 24 hours) at 09/24/2019 0752 Last data filed at 09/24/2019 0634 Gross per 24 hour  Intake 1080 ml  Output 1680 ml  Net -600 ml     Labs: Recent Labs    09/22/19 1244 09/23/19 0327 09/24/19 0330  HGB 11.1* 8.9* 8.5*   Recent Labs    09/23/19 0327 09/24/19 0330  WBC 5.8 8.1  RBC 3.17* 3.08*  HCT 29.0* 28.0*  PLT 354 360   Recent Labs    09/23/19 0327 09/24/19 0330  NA 139 137  K 4.9 4.4  CL 105 103  CO2 26 26  BUN 18 22  CREATININE 0.97 1.11*  GLUCOSE 156* 122*  CALCIUM 8.5* 8.6*   Recent Labs    09/22/19 1244  INR 1.0    Exam: General - Patient is Alert and Oriented Extremity - Neurologically intact Neurovascular intact Sensation intact distally Dorsiflexion/Plantar flexion intact Dressing/Incision - clean, dry, no drainage Motor Function - intact, moving foot and toes well on exam.   Past Medical History:  Diagnosis Date  . Anxiety   . Depression   . Generalized pain   . Hypercholesteremia   . Hypertension   . Hypothyroidism   . Thyroid disease   . 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   Weight as of this encounter: 79 kg. Up with therapy  DVT Prophylaxis - Aspirin Weight-bearing as tolerated  Per ID consult:  - will continue IV cefazolin x 8 weeks - add rifampin 300 mg BID  Plan for discharge to home with HHPT. Follow-up in the office in 2 weeks.   , PA-C Orthopedic Surgery [phone] 09/24/2019, 7:52 AM

## 2019-09-25 LAB — CBC
HCT: 26.8 % — ABNORMAL LOW (ref 36.0–46.0)
Hemoglobin: 8.1 g/dL — ABNORMAL LOW (ref 12.0–15.0)
MCH: 27.2 pg (ref 26.0–34.0)
MCHC: 30.2 g/dL (ref 30.0–36.0)
MCV: 89.9 fL (ref 80.0–100.0)
Platelets: 355 10*3/uL (ref 150–400)
RBC: 2.98 MIL/uL — ABNORMAL LOW (ref 3.87–5.11)
RDW: 14.8 % (ref 11.5–15.5)
WBC: 7.4 10*3/uL (ref 4.0–10.5)
nRBC: 0 % (ref 0.0–0.2)

## 2019-09-25 MED ORDER — FERROUS SULFATE 325 (65 FE) MG PO TBEC: 325.0000 mg | DELAYED_RELEASE_TABLET | Freq: Two times a day (BID) | ORAL | 0 refills | Status: AC

## 2019-09-25 MED ORDER — HEPARIN SOD (PORK) LOCK FLUSH 100 UNIT/ML IV SOLN
250.0000 [IU] | INTRAVENOUS | Status: AC | PRN
  Administered 2019-09-25: 250 [IU]
  Filled 2019-09-25: qty 2.5

## 2019-09-25 NOTE — Progress Notes (Addendum)
   Subjective: 3 Days Post-Op Procedure(s) (LRB): IRRIGATION AND DEBRIDEMENT RIGHT HIP; BEARING SURFACE REVISION (Right) Patient reports pain as mild.   Patient seen in rounds with Dr. Lequita Halt. Patient is well, and has had no acute complaints or problems. States she has been resting well with little pain. Denies chest pain or SOB. No issues overnight.  Plan is to go Home after hospital stay.  Objective: Vital signs in last 24 hours: Temp:  [98 F (36.7 C)-98.2 F (36.8 C)] 98 F (36.7 C) (04/10 0601) Pulse Rate:  [67-69] 67 (04/10 0601) Resp:  [16-18] 16 (04/10 0601) BP: (118-138)/(50-60) 138/55 (04/10 0601) SpO2:  [93 %-95 %] 95 % (04/10 0601)  Intake/Output from previous day:  Intake/Output Summary (Last 24 hours) at 09/25/2019 0733 Last data filed at 09/24/2019 1846 Gross per 24 hour  Intake 1020 ml  Output --  Net 1020 ml    Intake/Output this shift: No intake/output data recorded.  Labs: Recent Labs    09/22/19 1244 09/23/19 0327 09/24/19 0330 09/25/19 0324  HGB 11.1* 8.9* 8.5* 8.1*   Recent Labs    09/24/19 0330 09/25/19 0324  WBC 8.1 7.4  RBC 3.08* 2.98*  HCT 28.0* 26.8*  PLT 360 355   Recent Labs    09/23/19 0327 09/24/19 0330  NA 139 137  K 4.9 4.4  CL 105 103  CO2 26 26  BUN 18 22  CREATININE 0.97 1.11*  GLUCOSE 156* 122*  CALCIUM 8.5* 8.6*   Recent Labs    09/22/19 1244  INR 1.0    Exam: General - Patient is Alert and Oriented Extremity - Neurologically intact Neurovascular intact Sensation intact distally Dorsiflexion/Plantar flexion intact Dressing/Incision - Aquacell in place, dry with no drainage Motor Function - intact, moving foot and toes well on exam.   Past Medical History:  Diagnosis Date  . Anxiety   . Depression   . Generalized pain   . Hypercholesteremia   . Hypertension   . Hypothyroidism   . Thyroid disease   . 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   Weight as of this encounter: 79 kg. Up with therapy  DVT Prophylaxis - Aspirin Weight-bearing as tolerated  Hemoglobin 8.1 this AM. Will send home with 2 weeks of 325 mg ferrous sulfate BID.  Discharge today, was unable to d/c yesterday due to home health arrangements.   , PA-C Orthopedic Surgery [phone] 09/25/2019, 7:33 AM

## 2019-09-25 NOTE — TOC Transition Note (Signed)
Transition of Care [facility] - CM/SW Discharge Note   Patient Details  Name: Elizabeth Shepard MRN: [id number] Date of Birth: 05-Jan-1941  [facility]) CM/SW Contact:  Amada Jupiter, LCSW Phone Number: 09/25/2019, 9:35 AM   Clinical Narrative:  Pt has been cleared for d/c today. See below for referrals made.  No further needs.     Final next level of care: Home w Home Health Services Barriers to Discharge: Barriers Resolved   Patient Goals and CMS Choice Patient states their goals for this hospitalization and ongoing recovery are:: "I need to be able to do things for myself." CMS Medicare.gov Compare Post Acute Care list provided to:: Patient Choice offered to / list presented to : Patient  Discharge Placement                       Discharge Plan and Services In-house Referral: Clinical Social Work   Post Acute Care Choice: Home Health          DME Arranged: N/A(has needed DME) DME Agency: NA       HH Arranged: RN, PT, Nurse's Aide HH Agency: [facility] Other - See comment([facility] - RN only;  [facility] - PT and [facility]) Date HH Agency Contacted: 09/23/19 Time HH Agency Contacted: 1200 Representative spoke with at [facility] Agency: spoke with Jeri Modena with [facility] who was assisting with coordination with [facility];  spoke with Clydie Braun with [facility] about PT/aide  Social Determinants of Health (SDOH) Interventions     Readmission Risk Interventions Readmission Risk Prevention Plan 09/23/2019  Post Dischage Appt Complete  Medication Screening Complete  Some recent data might be hidden

## 2019-09-25 NOTE — Progress Notes (Signed)
Physical Therapy Treatment Patient Details Name: Elizabeth Shepard MRN: [id number] DOB: Feb 13, 1941 Today's Date: 09/25/2019    History of Present Illness Pt s/p R THR I&D with bearing surface exchange.  Pt with hx of bil THR.    PT Comments    Pt continues to progress steadily with mobility but requiring cues for adherence to THP.  Pt eager for dc home and reviewed stairs and car transfers in preparation for dc.   Follow Up Recommendations  Follow surgeon's recommendation for DC plan and follow-up therapies     Equipment Recommendations  None recommended by PT    Recommendations for Other Services OT consult     Precautions / Restrictions Precautions Precautions: Posterior Hip;Fall Precaution Booklet Issued: Yes (comment) Precaution Comments: Pt recalls 2/3 THP without cues.  All THP reviewed. Restrictions Weight Bearing Restrictions: No Other Position/Activity Restrictions: WBAT    Mobility  Bed Mobility Overal bed mobility: Needs Assistance Bed Mobility: Supine to Sit     Supine to sit: Supervision     General bed mobility comments: INcreased time with cues for sequence, use of L LE to self assist and adherence to THP;   Transfers Overall transfer level: Needs assistance Equipment used: Rolling walker (2 wheeled) Transfers: Sit to/from Stand Sit to Stand: Supervision         General transfer comment: cues for LE management and use of UEs to self assist  Ambulation/Gait Ambulation/Gait assistance: Min guard;Supervision Gait Distance (Feet): 200 Feet(and 15' into bathroom) Assistive device: Rolling walker (2 wheeled) Gait Pattern/deviations: Step-to pattern;Step-through pattern;Decreased step length - right;Decreased step length - left;Shuffle;Trunk flexed Gait velocity: decr   General Gait Details: cues for sequence, posture, position from RW and adherence to THP   Stairs Stairs: Yes Stairs assistance: Min guard Stair Management: Two rails;Step to  pattern;Forwards Number of Stairs: 5 General stair comments: pt self cues for sequence   Wheelchair Mobility    Modified Rankin (Stroke Patients Only)       Balance Overall balance assessment: Needs assistance Sitting-balance support: No upper extremity supported;Feet supported Sitting balance-Leahy Scale: Good     Standing balance support: Bilateral upper extremity supported Standing balance-Leahy Scale: Fair                              Cognition Arousal/Alertness: Awake/alert Behavior During Therapy: WFL for tasks assessed/performed Overall Cognitive Status: No family/caregiver present to determine baseline cognitive functioning                                 General Comments: slow processing and requiring repetition and explanation      Exercises      General Comments        Pertinent Vitals/Pain Pain Assessment: 0-10 Pain Score: 2  Pain Location: R hip Pain Descriptors / Indicators: Aching;Sore Pain Intervention(s): Limited activity within patient's tolerance;Premedicated before session;Monitored during session;Ice applied    Home Living                      Prior Function            PT Goals (current goals can now be found in the care plan section) Acute Rehab PT Goals Patient Stated Goal: Regain IND PT Goal Formulation: With patient Time For Goal Achievement: 10/07/19 Potential to Achieve Goals: Good Progress towards PT goals: Progressing toward goals  Frequency    7X/week      PT Plan Current plan remains appropriate    Co-evaluation              AM-PAC PT "6 Clicks" Mobility   Outcome Measure  Help needed turning from your back to your side while in a flat bed without using bedrails?: A Lot Help needed moving from lying on your back to sitting on the side of a flat bed without using bedrails?: A Little Help needed moving to and from a bed to a chair (including a wheelchair)?: A  Little Help needed standing up from a chair using your arms (e.g., wheelchair or bedside chair)?: A Little Help needed to walk in hospital room?: A Little Help needed climbing 3-5 steps with a railing? : A Little 6 Click Score: 17    End of Session Equipment Utilized During Treatment: Gait belt Activity Tolerance: Patient tolerated treatment well Patient left: in chair;with call bell/phone within reach;with chair alarm set Nurse Communication: Mobility status PT Visit Diagnosis: Difficulty in walking, not elsewhere classified (R26.2)     Time: 4784-1282 PT Time Calculation (min) (ACUTE ONLY): 32 min  Charges:  $Gait Training: 8-22 mins $Therapeutic Activity: 8-22 mins                     Mauro Kaufmann PT Acute Rehabilitation Services Pager [phone] Office [phone]    Hava Massingale 09/25/2019, 12:21 PM

## 2019-09-27 LAB — AEROBIC/ANAEROBIC CULTURE W GRAM STAIN (SURGICAL/DEEP WOUND): Culture: NO GROWTH

## 2019-09-27 NOTE — Discharge Summary (Signed)
Physician Discharge Summary   Patient ID: Elizabeth Shepard MRN: [id number] DOB/AGE: Dec 04, 1940 79 y.o.  Admit date: 09/22/2019 Discharge date: 09/25/2019  Primary Diagnosis: Infected right total hip arthroplasty   Admission Diagnoses:  Past Medical History:  Diagnosis Date  . Anxiety   . Depression   . Generalized pain   . Hypercholesteremia   . Hypertension   . Hypothyroidism   . Thyroid disease   . 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   Weight as of this encounter: 79 kg.  Procedure:  Procedure(s) (LRB): IRRIGATION AND DEBRIDEMENT RIGHT HIP; BEARING SURFACE REVISION (Right)   Consults: ID  HPI: The patient is a 79 year old female who had a right total hip arthroplasty done nearly 15 years ago.  She has not been having any issues and then spontaneously presented to the office with drainage from her previous hip incision.   She was not having pain with this, nor fever, chills or constitutional symptoms.  We aspirated this and fluid analysis showed that there was a staph species present.  She presents now for irrigation, debridement and liner exchange.  We discussed that a  2-stage revision would perhaps be a more predictable means of eliminating infection, but given the morbidity associated with that, she elected to try the irrigation and debridement and bearing surface exchange.  She presents today for that procedure.  Laboratory Data: Admission on 09/22/2019, Discharged on 09/25/2019  Component Date Value Ref Range Status  . WBC 09/22/2019 8.4  4.0 - 10.5 K/uL Final  . RBC 09/22/2019 4.01  3.87 - 5.11 MIL/uL Final  . Hemoglobin 09/22/2019 11.1* 12.0 - 15.0 g/dL Final  . HCT 09/22/2019 36.2  36.0 -  46.0 % Final  . MCV 09/22/2019 90.3  80.0 - 100.0 fL Final  . MCH 09/22/2019 27.7  26.0 - 34.0 pg Final  . MCHC 09/22/2019 30.7  30.0 - 36.0 g/dL Final  . RDW 09/22/2019 14.4  11.5 - 15.5 % Final  . Platelets 09/22/2019 544* 150 - 400 K/uL Final  . nRBC 09/22/2019 0.0  0.0 - 0.2 % Final   Performed at [facility], [place] [address] [place], [place] [address]  . Sodium 09/22/2019 136  135 - 145 mmol/L Final  . Potassium 09/22/2019 4.6  3.5 - 5.1 mmol/L Final  . Chloride 09/22/2019 105  98 - 111 mmol/L Final  . CO2 09/22/2019 19* 22 - 32 mmol/L Final  . Glucose, Bld 09/22/2019 117* 70 - 99 mg/dL Final   Glucose reference range applies only to samples taken after fasting for at least 8 hours.  . BUN 09/22/2019 23  8 - 23 mg/dL Final  . Creatinine, Ser 09/22/2019 1.03* 0.44 - 1.00 mg/dL Final  . Calcium 09/22/2019 9.3  8.9 - 10.3 mg/dL Final  . Total Protein 09/22/2019 8.1  6.5 - 8.1 g/dL Final  . Albumin 09/22/2019 3.9  3.5 - 5.0 g/dL Final  . AST 09/22/2019 29  15 - 41 U/L Final  . ALT 09/22/2019 17  0 - 44 U/L Final  . Alkaline Phosphatase 09/22/2019 71  38 - 126 U/L Final  . Total Bilirubin 09/22/2019 1.0  0.3 - 1.2 mg/dL Final  . GFR  calc non Af Amer 09/22/2019 52* >60 mL/min Final  . GFR calc Af Amer 09/22/2019 >60  >60 mL/min Final  . Anion gap 09/22/2019 12  5 - 15 Final   Performed at [facility], [place] [address] [place], [place] [address]  . Prothrombin Time 09/22/2019 12.8  11.4 - 15.2 seconds Final  . INR 09/22/2019 1.0  0.8 - 1.2 Final   Comment: (NOTE) INR goal varies based on device and disease states. Performed at [facility], [place] [address], [place], [place] [address]   . aPTT 09/22/2019 29  24 - 36 seconds Final   Performed at [facility], [place] [address], [place], [place] [address]  . ABO/RH(D) 09/22/2019 O POS   Final  . Antibody Screen 09/22/2019 NEG   Final  . Sample Expiration  09/22/2019    Final                   Value:09/25/2019,2359 Performed at [facility], [place] [address], [place], [place] [address]   . SARS Coronavirus 2 by RT PCR 09/22/2019 NEGATIVE  NEGATIVE Final   Comment: (NOTE) SARS-CoV-2 target nucleic acids are NOT DETECTED. The SARS-CoV-2 RNA is generally detectable in upper respiratoy specimens during the acute phase of infection. The lowest concentration of SARS-CoV-2 viral copies this assay can detect is 131 copies/mL. A negative result does not preclude SARS-Cov-2 infection and should not be used as the sole basis for treatment or other patient management decisions. A negative result may occur with  improper specimen collection/handling, submission of specimen other than nasopharyngeal swab, presence of viral mutation(s) within the areas targeted by this assay, and inadequate number of viral copies (<131 copies/mL). A negative result must be combined with clinical observations, patient history, and epidemiological information. The expected result is Negative. Fact Sheet for Patients:  PinkCheek.be Fact Sheet for Healthcare Providers:  GravelBags.it This test is not yet ap                          proved or cleared by the [place] FDA and  has been authorized for detection and/or diagnosis of SARS-CoV-2 by FDA under an Emergency Use Authorization (EUA). This EUA will remain  in effect (meaning this test can be used) for the duration of the COVID-19 declaration under Section 564(b)(1) of the Act, 21 U.S.C. section 360bbb-3(b)(1), unless the authorization is terminated or revoked sooner.   . Influenza A by PCR 09/22/2019 NEGATIVE  NEGATIVE Final  . Influenza B by PCR 09/22/2019 NEGATIVE  NEGATIVE Final   Comment: (NOTE) The Xpert Xpress SARS-CoV-2/FLU/RSV assay is intended as an aid in  the diagnosis of influenza from Nasopharyngeal swab specimens and   should not be used as a sole basis for treatment. Nasal washings and  aspirates are unacceptable for Xpert Xpress SARS-CoV-2/FLU/RSV  testing. Fact Sheet for Patients: PinkCheek.be Fact Sheet for Healthcare Providers: GravelBags.it This test is not yet approved or cleared by the [place] FDA and  has been authorized for detection and/or diagnosis of SARS-CoV-2 by  FDA under an Emergency Use Authorization (EUA). This EUA will remain  in effect (meaning this test can be used) for the duration of the  Covid-19 declaration under Section 564(b)(1) of the Act, 21  U.S.C. section 360bbb-3(b)(1), unless the authorization is  terminated or revoked. Performed at [facility], [place] [address], [place], [place] [address]   . Specimen Description  09/22/2019    Final                   Value:FLUID HIP RIGHT Performed at [place] [address] [place], [place] [address]   . Special Requests 09/22/2019    Final                   Value:NONE Performed at [facility], [place] [address] [place], [place] [address]   . Gram Stain 09/22/2019    Final                   Value:ABUNDANT WBC PRESENT,BOTH PMN AND MONONUCLEAR NO ORGANISMS SEEN   . Culture 09/22/2019    Final                   Value:NO GROWTH 3 DAYS Performed at [facility], [place] [address] [place], [place] [address]   . Report Status 09/22/2019 09/25/2019 FINAL   Final  . WBC 09/23/2019 5.8  4.0 - 10.5 K/uL Final  . RBC 09/23/2019 3.17* 3.87 - 5.11 MIL/uL Final  . Hemoglobin 09/23/2019 8.9* 12.0 - 15.0 g/dL Final  . HCT 09/23/2019 29.0* 36.0 - 46.0 % Final  . MCV 09/23/2019 91.5  80.0 - 100.0 fL Final  . MCH 09/23/2019 28.1  26.0 - 34.0 pg Final  . MCHC 09/23/2019 30.7  30.0 - 36.0 g/dL Final  . RDW 09/23/2019 14.4  11.5 - 15.5 % Final  . Platelets 09/23/2019 354  150 - 400 K/uL Final  . nRBC 09/23/2019 0.0   0.0 - 0.2 % Final   Performed at [facility], [place] [address], [place], [place] [address]  . Sodium 09/23/2019 139  135 - 145 mmol/L Final  . Potassium 09/23/2019 4.9  3.5 - 5.1 mmol/L Final  . Chloride 09/23/2019 105  98 - 111 mmol/L Final  . CO2 09/23/2019 26  22 - 32 mmol/L Final  . Glucose, Bld 09/23/2019 156* 70 - 99 mg/dL Final   Glucose reference range applies only to samples taken after fasting for at least 8 hours.  . BUN 09/23/2019 18  8 - 23 mg/dL Final  . Creatinine, Ser 09/23/2019 0.97  0.44 - 1.00 mg/dL Final  . Calcium 09/23/2019 8.5* 8.9 - 10.3 mg/dL Final  . GFR calc non Af Amer 09/23/2019 56* >60 mL/min Final  . GFR calc Af Amer 09/23/2019 >60  >60 mL/min Final  . Anion gap 09/23/2019 8  5 - 15 Final   Performed at [facility], [place] [address], [place], [place] [address]  . WBC 09/24/2019 8.1  4.0 - 10.5 K/uL Final  . RBC 09/24/2019 3.08* 3.87 - 5.11 MIL/uL Final  . Hemoglobin 09/24/2019 8.5* 12.0 - 15.0 g/dL Final  . HCT 09/24/2019 28.0* 36.0 - 46.0 % Final  . MCV 09/24/2019 90.9  80.0 - 100.0 fL Final  . MCH 09/24/2019 27.6  26.0 - 34.0 pg Final  . MCHC 09/24/2019 30.4  30.0 - 36.0 g/dL Final  . RDW 09/24/2019 14.6  11.5 - 15.5 % Final  . Platelets 09/24/2019 360  150 - 400 K/uL Final  . nRBC 09/24/2019 0.0  0.0 - 0.2 % Final   Performed at [facility], [place] [address] [place], [place] [address]  . Sodium 09/24/2019 137  135 - 145 mmol/L Final  . Potassium 09/24/2019 4.4  3.5 - 5.1 mmol/L Final  . Chloride 09/24/2019 103  98 - 111 mmol/L Final  . CO2 09/24/2019 26  22 - 32 mmol/L Final  . Glucose, Bld 09/24/2019 122* 70 - 99 mg/dL Final   Glucose reference range applies only to samples taken after fasting for at least 8 hours.  . BUN 09/24/2019 22  8 - 23 mg/dL Final  . Creatinine, Ser 09/24/2019 1.11* 0.44 - 1.00 mg/dL Final  . Calcium 09/24/2019 8.6* 8.9 - 10.3 mg/dL Final  . GFR calc non Af Amer  09/24/2019 48* >60 mL/min Final  . GFR calc Af Amer 09/24/2019 55* >60 mL/min Final  . Anion gap 09/24/2019 8  5 - 15 Final   Performed at [facility], [place] [address] [place], [place] [address]  . WBC 09/25/2019 7.4  4.0 - 10.5 K/uL Final  . RBC 09/25/2019 2.98* 3.87 - 5.11 MIL/uL Final  . Hemoglobin 09/25/2019 8.1* 12.0 - 15.0 g/dL Final  . HCT 09/25/2019 26.8* 36.0 - 46.0 % Final  . MCV 09/25/2019 89.9  80.0 - 100.0 fL Final  . MCH 09/25/2019 27.2  26.0 - 34.0 pg Final  . MCHC 09/25/2019 30.2  30.0 - 36.0 g/dL Final  . RDW 09/25/2019 14.8  11.5 - 15.5 % Final  . Platelets 09/25/2019 355  150 - 400 K/uL Final  . nRBC 09/25/2019 0.0  0.0 - 0.2 % Final   Performed at [facility], [place] [address] [place], [place] [address]     X-Rays:US EKG SITE RITE  Result Date: 09/23/2019 If Site Rite image not attached, placement could not be confirmed due to current cardiac rhythm.   EKG: Orders placed or performed during the hospital encounter of 04/22/19  . ED EKG  . ED EKG  . EKG     Hospital Course: Elizabeth Shepard is a 79 y.o. who was admitted to [facility]. They were brought to the operating room on 09/22/2019 and underwent Procedure(s): IRRIGATION AND DEBRIDEMENT RIGHT HIP; BEARING SURFACE REVISION.  Patient tolerated the procedure well and was later transferred to the recovery room and then to the orthopaedic floor for postoperative care. They were given PO and IV analgesics for pain control following their surgery. They were given 24 hours of postoperative antibiotics of  Anti-infectives (From admission, onward)   Start     Dose/Rate Route Frequency Ordered Stop   09/24/19 0000  ceFAZolin (ANCEF) IVPB     2 g Intravenous Every 8 hours 09/24/19 0805 11/17/19 2359   09/24/19 0000  rifampin (RIFADIN) 300 MG capsule     300 mg Oral Every 12 hours 09/24/19 0805 11/19/19 2359   09/23/19 2200  rifampin (RIFADIN) capsule 300 mg  Status:   Discontinued     300 mg Oral Every 12 hours 09/23/19 1522 09/25/19 1757   09/23/19 1400  ceFAZolin (ANCEF) IVPB 2g/100 mL premix  Status:  Discontinued     2 g 200 mL/hr over 30 Minutes Intravenous Every 8 hours 09/23/19 1137 09/25/19 1757   09/23/19 0600  ceFAZolin (ANCEF) IVPB 2g/100 mL premix     2 g 200 mL/hr over 30 Minutes Intravenous On call to O.R. 09/22/19 1232 09/22/19 1525   09/22/19 2200  ceFAZolin (ANCEF) IVPB 2g/100 mL premix     2 g 200 mL/hr over 30 Minutes Intravenous Every 6 hours 09/22/19 1814 09/23/19 0431     and started on DVT prophylaxis in the form of Aspirin.   PT and OT were ordered for total joint protocol. Discharge planning consulted  to help with postop disposition and equipment needs. Patient had a decent night on the evening of surgery. They started to get up OOB with therapy on POD #1. PICC line was placed on day one and infectious disease was consulted for prolonged IV antibiotic management. On day two, hemovac drain was pulled without difficulty. Aquacell dressing was in place, intact with no drainage. Per ID consult, plan was for IV cefazolin x 8 weeks with rifampin 300 mg PO BID. HHPT arrangements were made for Elizabeth Shepard to have PT, and both an Therapist, sports and aide sent out to the house. Due to delays being able to make home health arrangements, patient stayed an additional night. On POD #3, Elizabeth Shepard was seen during rounds and was ready to go home. She worked with therapy for one session and was meeting her goals. She was discharged to home later that day in stable condition.  Diet: Regular diet Activity: WBAT. Posterior hip precautions x 6 weeks. Follow-up: in 2 weeks Disposition: Home with HHPT Discharged Condition: stable   Discharge Instructions    Call MD / Call 911   Complete by: As directed    If you experience chest pain or shortness of breath, CALL 911 and be transported to the hospital emergency room.  If you develope a fever above 101 F, pus (white  drainage) or increased drainage or redness at the wound, or calf pain, call your surgeon's office.   Change dressing   Complete by: As directed    You have an adhesive waterproof bandage over the incision. Leave this in place until your first follow-up appointment. Once you remove this you will not need to place another bandage.   Constipation Prevention   Complete by: As directed    Drink plenty of fluids.  Prune juice may be helpful.  You may use a stool softener, such as Colace (over the counter) 100 mg twice a day.  Use MiraLax (over the counter) for constipation as needed.   Diet - low sodium heart healthy   Complete by: As directed    Do not sit on low chairs, stoools or toilet seats, as it may be difficult to get up from low surfaces   Complete by: As directed    Driving restrictions   Complete by: As directed    No driving for two weeks   Follow the hip precautions as taught in Physical Therapy   Complete by: As directed    Home infusion instructions   Complete by: As directed    Instructions: Flushing of vascular access device: 0.9% NaCl pre/post medication administration and prn patency; Heparin 100 u/ml, 41m for implanted ports and Heparin 10u/ml, 527mfor all other central venous catheters.   TED hose   Complete by: As directed    Use stockings (TED hose) for three weeks on both leg(s).  You may remove them at night for sleeping.   Weight bearing as tolerated   Complete by: As directed      Allergies as of 09/25/2019      Reactions   Ciprofloxacin Itching, Rash      Medication List    STOP taking these medications   cephALEXin 250 MG capsule Commonly known as: KEFLEX   Ibuprofen 200 MG Caps Commonly known as: Advil     TAKE these medications   aspirin 325 MG EC tablet Take 1 tablet (325 mg total) by mouth 2 (two) times daily for 19 days. Then take one 81 mg aspirin once  a day for three weeks. Then discontinue aspirin.   atorvastatin 40 MG tablet Commonly known  as: LIPITOR Take 40 mg by mouth every evening. 14:00   Blink Tears 0.25 % Soln Generic drug: Polyethylene Glycol 400 Place 1 drop into both ears in the morning and at bedtime.   ceFAZolin  IVPB Commonly known as: ANCEF Inject 2 g into the vein every 8 (eight) hours. Indication:  Prosthetic hip infection Last Day of Therapy:  11/16/2019 Labs - Once weekly:  CBC/D and BMP, Labs - Every other week:  ESR and CRP   conjugated estrogens vaginal cream Commonly known as: PREMARIN Place 1 Applicatorful vaginally 2 (two) times a week.   escitalopram 10 MG tablet Commonly known as: LEXAPRO Take 10 mg by mouth daily.   ferrous sulfate 325 (65 FE) MG EC tablet Take 1 tablet (325 mg total) by mouth 2 (two) times daily for 28 doses.   gabapentin 100 MG capsule Commonly known as: NEURONTIN Take 1 capsule (100 mg total) by mouth 2 (two) times daily.   HYDROcodone-acetaminophen 5-325 MG tablet Commonly known as: NORCO/VICODIN Take 1-2 tablets by mouth every 4 (four) hours as needed for severe pain.   levothyroxine 25 MCG tablet Commonly known as: SYNTHROID Take 25 mcg by mouth daily before breakfast.   lisinopril 10 MG tablet Commonly known as: ZESTRIL Take 10 mg by mouth daily.   methocarbamol 500 MG tablet Commonly known as: ROBAXIN Take 1 tablet (500 mg total) by mouth every 6 (six) hours as needed for muscle spasms.   mirabegron ER 25 MG Tb24 tablet Commonly known as: MYRBETRIQ Take 1 tablet (25 mg total) by mouth daily.   multivitamin with minerals Tabs tablet Take 1 tablet by mouth daily.   pantoprazole 40 MG tablet Commonly known as: PROTONIX Take 40 mg by mouth daily.   polyethylene glycol 17 g packet Commonly known as: MIRALAX / GLYCOLAX Take 17 g by mouth daily as needed for mild constipation or moderate constipation.   rifampin 300 MG capsule Commonly known as: RIFADIN Take 1 capsule (300 mg total) by mouth every 12 (twelve) hours.   traMADol 50 MG  tablet Commonly known as: ULTRAM Take 1 tablet (50 mg total) by mouth every 8 (eight) hours as needed for severe pain.            Home Infusion Instuctions  (From admission, onward)         Start     Ordered   09/24/19 0000  Home infusion instructions    Question:  Instructions  Answer:  Flushing of vascular access device: 0.9% NaCl pre/post medication administration and prn patency; Heparin 100 u/ml, 86m for implanted ports and Heparin 10u/ml, 591mfor all other central venous catheters.   09/24/19 0805           Discharge Care Instructions  (From admission, onward)         Start     Ordered   09/24/19 0000  Weight bearing as tolerated     09/24/19 0805   09/24/19 0000  Change dressing    Comments: You have an adhesive waterproof bandage over the incision. Leave this in place until your first follow-up appointment. Once you remove this you will not need to place another bandage.   09/24/19 0805         Follow-up Information    AlGaynelle ArabianMD. Schedule an appointment as soon as possible for a visit on 10/07/2019.   Specialty: Orthopedic Surgery Contact  information: [address] [place] [address] [phone]        [facility] Follow up.   Why: Will be providing home health physical therapy and aide. Contact information: [place] [facility] up.   Why: will providing RN for IV antibiotic management Contact information: [phone]          Signed: Theresa Duty, PA-C Orthopedic Surgery 09/27/2019, 8:15 AM

## 2019-09-28 ENCOUNTER — Telehealth: Payer: Self-pay

## 2019-09-28 NOTE — Telephone Encounter (Signed)
Called [facility] to add weekly LFT's to lab orders.  Jenella Craigie Loyola Mast, RN

## 2019-10-06 ENCOUNTER — Telehealth: Payer: Self-pay

## 2019-10-06 NOTE — Telephone Encounter (Signed)
Elizabeth Shepard, Pharmacist with [facility] called office with lab results. States patient's Hemoglobin was 7.9 and hematocrit was 25.2 on 4/19. Lorenso Courier, [place]

## 2019-10-06 NOTE — Telephone Encounter (Signed)
That is better than they  were on the 10th

## 2019-10-11 ENCOUNTER — Encounter: Payer: Self-pay | Admitting: Infectious Disease

## 2019-11-01 ENCOUNTER — Encounter: Payer: Self-pay | Admitting: Infectious Disease

## 2019-11-03 ENCOUNTER — Ambulatory Visit: Payer: Medicare Other | Admitting: Infectious Disease

## 2019-11-03 ENCOUNTER — Other Ambulatory Visit: Payer: Self-pay

## 2019-11-03 ENCOUNTER — Telehealth: Payer: Self-pay

## 2019-11-03 ENCOUNTER — Encounter: Payer: Self-pay | Admitting: Infectious Disease

## 2019-11-03 VITALS — BP 116/78 | HR 90 | Temp 97.7°F | Wt 168.0 lb

## 2019-11-03 DIAGNOSIS — T8451XD Infection and inflammatory reaction due to internal right hip prosthesis, subsequent encounter: Secondary | ICD-10-CM | POA: Diagnosis not present

## 2019-11-03 DIAGNOSIS — A4901 Methicillin susceptible Staphylococcus aureus infection, unspecified site: Secondary | ICD-10-CM

## 2019-11-03 DIAGNOSIS — M00051 Staphylococcal arthritis, right hip: Secondary | ICD-10-CM | POA: Diagnosis not present

## 2019-11-03 HISTORY — DX: Methicillin susceptible Staphylococcus aureus infection, unspecified site: A49.01

## 2019-11-03 MED ORDER — RIFAMPIN 300 MG PO CAPS: 300.0000 mg | ORAL_CAPSULE | Freq: Two times a day (BID) | ORAL | 5 refills | Status: AC

## 2019-11-03 MED ORDER — CEPHALEXIN 500 MG PO CAPS: 500.0000 mg | ORAL_CAPSULE | Freq: Four times a day (QID) | ORAL | 11 refills | Status: AC

## 2019-11-03 NOTE — Telephone Encounter (Signed)
Per MD called [facility] with orders to extend IV antibiotics through 6/9. Spoke with Coretta who will relay orders to home health and pharmacy. Lorenso Courier, [place]

## 2019-11-03 NOTE — Progress Notes (Signed)
Subjective:   Chief complaint: Still having some drainage from her hip she says   Patient ID: Elizabeth Shepard, female    DOB: [birth year], 79 y.o.   MRN: [id number]  HPI   79 y.o. 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  Methicillin sensitive Staph aureus has been isolated on culture.  She is on cefazolin and on course to get through 8 weeks of therapy she has seen Dr. Maureen Ralphs in followup and her operative site seems to be scabbing though she says there is still some drainage.  When I asked her about pain at the site she seemed to not be aware of having pain there now although she also could not tell me much about pain that she had when her infection was diagnosed.  In reviewing her labs done through [facility] I noticed that her sed rate had risen in the last few weeks after going down to near normal levels it is gone about 30.     Past Medical History:  Diagnosis Date  . Anxiety   . Depression   . Generalized pain   . Hypercholesteremia   . Hypertension   . Hypothyroidism   . Thyroid disease   . UTI (lower urinary tract infection)     Past Surgical History:  Procedure Laterality Date  . AUGMENTATION MAMMAPLASTY Bilateral 1980  . BREAST BIOPSY Right 1970   neg  . COLONOSCOPY WITH PROPOFOL N/A 09/12/2015   Procedure: COLONOSCOPY WITH PROPOFOL;  Surgeon: Lollie Sails, MD;  Location: [facility] ENDOSCOPY;  Service: Endoscopy;  Laterality: N/A;  . ESOPHAGOGASTRODUODENOSCOPY N/A 07/06/2015   Procedure: ESOPHAGOGASTRODUODENOSCOPY (EGD);  Surgeon: Lollie Sails, MD;  Location: [facility] ENDOSCOPY;  Service: Endoscopy;  Laterality: N/A;  . ESOPHAGOGASTRODUODENOSCOPY (EGD) WITH PROPOFOL N/A 09/12/2015   Procedure: ESOPHAGOGASTRODUODENOSCOPY (EGD) WITH PROPOFOL;  Surgeon: Lollie Sails, MD;  Location: [facility] ENDOSCOPY;  Service: Endoscopy;  Laterality: N/A;  . HIP SURGERY    . INCISION AND DRAINAGE HIP Right 09/22/2019   Procedure:  IRRIGATION AND DEBRIDEMENT RIGHT HIP; BEARING SURFACE REVISION;  Surgeon: Gaynelle Arabian, MD;  Location: [facility] ORS;  Service: Orthopedics;  Laterality: Right;  49mn  . Mastoid surgery    . TONSILLECTOMY    . TOTAL HIP ARTHROPLASTY Bilateral     Family History  Problem Relation Age of Onset  . CAD Mother   . Diabetes Mother   . CAD Father   . Diabetes Father   . Bladder Cancer Neg Hx   . Prostate cancer Neg Hx   . Kidney cancer Neg Hx       Social History   Socioeconomic History  . Marital status: Married    Spouse name: EChristean Grief  . Number of children: Not on file  . Years of education: 148  . Highest education level: Not on file  Occupational History  . Occupation: Retired   Tobacco Use  . Smoking status: Never Smoker  . Smokeless tobacco: Never Used  Substance and Sexual Activity  . Alcohol use: No  . Drug use: No  . Sexual activity: Not on file  Other Topics Concern  . Not on file  Social History Narrative  . Not on file   Social Determinants of Health   Financial Resource Strain: Low Risk   . Difficulty of Paying Living Expenses: Not hard at all  Food Insecurity: No Food Insecurity  . Worried About RCharity fundraiserin the Last Year:  Never true  . Ran Out of Food in the Last Year: Never true  Transportation Needs: No Transportation Needs  . Lack of Transportation (Medical): No  . Lack of Transportation (Non-Medical): No  Physical Activity: Unknown  . Days of Exercise per Week: Patient refused  . Minutes of Exercise per Session: Patient refused  Stress: No Stress Concern Present  . Feeling of Stress : Not at all  Social Connections: Unknown  . Frequency of Communication with Friends and Family: Patient refused  . Frequency of Social Gatherings with Friends and Family: Patient refused  . Attends Religious Services: Patient refused  . Active Member of Clubs or Organizations: Patient refused  . Attends Archivist Meetings: Patient refused  . Marital  Status: Patient refused    Allergies  Allergen Reactions  . Ciprofloxacin Itching and Rash     Current Outpatient Medications:  .  atorvastatin (LIPITOR) 40 MG tablet, Take 40 mg by mouth every evening. 14:00, Disp: , Rfl:  .  ceFAZolin (ANCEF) IVPB, Inject 2 g into the vein every 8 (eight) hours. Indication:  Prosthetic hip infection Last Day of Therapy:  11/16/2019 Labs - Once weekly:  CBC/D and BMP, Labs - Every other week:  ESR and CRP, Disp: 162 Units, Rfl: 0 .  conjugated estrogens (PREMARIN) vaginal cream, Place 1 Applicatorful vaginally 2 (two) times a week. , Disp: , Rfl:  .  escitalopram (LEXAPRO) 10 MG tablet, Take 10 mg by mouth daily., Disp: , Rfl:  .  ferrous sulfate 325 (65 FE) MG EC tablet, Take 1 tablet (325 mg total) by mouth 2 (two) times daily for 28 doses., Disp: 28 tablet, Rfl: 0 .  gabapentin (NEURONTIN) 100 MG capsule, Take 1 capsule (100 mg total) by mouth 2 (two) times daily., Disp: 60 capsule, Rfl: 0 .  HYDROcodone-acetaminophen (NORCO/VICODIN) 5-325 MG tablet, Take 1-2 tablets by mouth every 4 (four) hours as needed for severe pain., Disp: 56 tablet, Rfl: 0 .  levothyroxine (SYNTHROID, LEVOTHROID) 25 MCG tablet, Take 25 mcg by mouth daily before breakfast., Disp: , Rfl:  .  lisinopril (PRINIVIL,ZESTRIL) 10 MG tablet, Take 10 mg by mouth daily., Disp: , Rfl:  .  methocarbamol (ROBAXIN) 500 MG tablet, Take 1 tablet (500 mg total) by mouth every 6 (six) hours as needed for muscle spasms., Disp: 40 tablet, Rfl: 0 .  mirabegron ER (MYRBETRIQ) 25 MG TB24 tablet, Take 1 tablet (25 mg total) by mouth daily., Disp: 30 tablet, Rfl: 5 .  Multiple Vitamin (MULTIVITAMIN WITH MINERALS) TABS tablet, Take 1 tablet by mouth daily., Disp: , Rfl:  .  pantoprazole (PROTONIX) 40 MG tablet, Take 40 mg by mouth daily., Disp: , Rfl:  .  polyethylene glycol (MIRALAX / GLYCOLAX) 17 g packet, Take 17 g by mouth daily as needed for mild constipation or moderate constipation., Disp: 14 each, Rfl:  0 .  Polyethylene Glycol 400 (BLINK TEARS) 0.25 % SOLN, Place 1 drop into both ears in the morning and at bedtime., Disp: , Rfl:  .  rifampin (RIFADIN) 300 MG capsule, Take 1 capsule (300 mg total) by mouth every 12 (twelve) hours., Disp: 112 capsule, Rfl: 0 .  traMADol (ULTRAM) 50 MG tablet, Take 1 tablet (50 mg total) by mouth every 8 (eight) hours as needed for severe pain., Disp: 15 tablet, Rfl: 0   Review of Systems  Unable to perform ROS: Dementia       Objective:   Physical Exam Constitutional:      General:  She is not in acute distress.    Appearance: Normal appearance. She is well-developed. She is not ill-appearing or diaphoretic.  HENT:     Head: Normocephalic and atraumatic.     Right Ear: External ear normal. Decreased hearing noted.     Left Ear: External ear normal. Decreased hearing noted.     Nose: No nasal deformity or rhinorrhea.  Eyes:     General: No scleral icterus.    Conjunctiva/sclera: Conjunctivae normal.     Right eye: Right conjunctiva is not injected.     Left eye: Left conjunctiva is not injected.     Pupils: Pupils are equal, round, and reactive to light.  Neck:     Vascular: No JVD.  Cardiovascular:     Rate and Rhythm: Normal rate and regular rhythm.     Heart sounds: Normal heart sounds, S1 normal and S2 normal. No murmur. No friction rub.  Pulmonary:     Effort: Pulmonary effort is normal. No respiratory distress.     Breath sounds: No wheezing.  Abdominal:     General: Abdomen is flat. There is no distension.     Palpations: Abdomen is soft.     Tenderness: There is no abdominal tenderness.  Musculoskeletal:        General: Normal range of motion.     Right shoulder: Normal.     Left shoulder: Normal.     Cervical back: Normal range of motion and neck supple.     Right hip: Normal.     Left hip: Normal.     Right knee: Normal.     Left knee: Normal.  Lymphadenopathy:     Head:     Right side of head: No submandibular, preauricular  or posterior auricular adenopathy.     Left side of head: No submandibular, preauricular or posterior auricular adenopathy.     Cervical: No cervical adenopathy.     Right cervical: No superficial or deep cervical adenopathy.    Left cervical: No superficial or deep cervical adenopathy.  Skin:    General: Skin is warm and dry.     Coloration: Skin is not pale.     Findings: No abrasion, bruising, ecchymosis, erythema, lesion or rash.     Nails: There is no clubbing.  Neurological:     Mental Status: She is alert.     Sensory: No sensory deficit.     Coordination: Coordination normal.     Gait: Gait normal.  Psychiatric:        Attention and Perception: She is attentive.        Speech: Speech is delayed.        Behavior: Behavior normal. Behavior is cooperative.        Cognition and Memory: Memory is impaired.        Judgment: Judgment normal.      Hip 11/03/2019:         Assessment & Plan:  PJI with MSSA: I am anxious about her rising sed rate and about the organism involved with her prosthetic joint.  Staph aureus is notoriously difficult to eradicate from prosthetic joints without removal of the prosthesis and I am concerned that her rising sed rate may represent this infection redeclaring itself.  I did discuss this with Dr.Alusio.  I plan on extending her IV antibiotics through 9 June which will take her through her appointment with him.  If when he sees her it looks like she needs to go back to the  operating room we will still have a PICC line in place if we need to reset the "clock on systemic IV antibiotics.  In the interim she is to continue her rifampin as well.  I have also called in Keflex should she improve and we feel comfortable transitioning her from IV to oral antibiotics.  Fatigue: She does complain of some fatigue today which could be multifactorial she asked if it could be related to the antibiotics and I told her rifampin certainly is sometimes difficult  for patients to tolerate.  Normocytic Normochromic Anemia: She asked about whether she should be taking iron I told her that my main concern was whether the antibiotics were having any adverse effect on her blood cell lines and her hemoglobin has remained stable.  I instructed her to talk to her primary care physician if further work-up is needed at this point in time.  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  She does live at home with her husband who she says is having difficulty administering the antibiotics.  I am going to ask [facility] to reteach them to make sure they are doing it properly

## 2019-11-08 ENCOUNTER — Encounter: Payer: Self-pay | Admitting: Infectious Disease

## 2019-11-16 DEATH — deceased

## 2019-12-15 ENCOUNTER — Ambulatory Visit: Payer: Medicare Other | Admitting: Infectious Disease

## 2020-02-08 ENCOUNTER — Ambulatory Visit: Payer: Self-pay | Admitting: Urology
